# Patient Record
Sex: Male | Born: 1965
Health system: Southern US, Community
[De-identification: ages and names within clinical notes are randomized; demographics above are authoritative.]

## PROBLEM LIST (undated history)

## (undated) DIAGNOSIS — I10 Essential (primary) hypertension: Secondary | ICD-10-CM

## (undated) DIAGNOSIS — K279 Peptic ulcer, site unspecified, unspecified as acute or chronic, without hemorrhage or perforation: Secondary | ICD-10-CM

## (undated) DIAGNOSIS — E119 Type 2 diabetes mellitus without complications: Secondary | ICD-10-CM

## (undated) DIAGNOSIS — G4730 Sleep apnea, unspecified: Secondary | ICD-10-CM

## (undated) DIAGNOSIS — E785 Hyperlipidemia, unspecified: Secondary | ICD-10-CM

## (undated) DIAGNOSIS — T7840XA Allergy, unspecified, initial encounter: Secondary | ICD-10-CM

## (undated) DIAGNOSIS — B9681 Helicobacter pylori [H. pylori] as the cause of diseases classified elsewhere: Secondary | ICD-10-CM

## (undated) DIAGNOSIS — K219 Gastro-esophageal reflux disease without esophagitis: Secondary | ICD-10-CM

## (undated) DIAGNOSIS — N200 Calculus of kidney: Secondary | ICD-10-CM

## (undated) HISTORY — DX: Type 2 diabetes mellitus without complications: E11.9

## (undated) HISTORY — DX: Essential (primary) hypertension: I10

## (undated) HISTORY — DX: Helicobacter pylori (H. pylori) as the cause of diseases classified elsewhere: B96.81

## (undated) HISTORY — DX: Allergy, unspecified, initial encounter: T78.40XA

## (undated) HISTORY — PX: NASAL SINUS SURGERY: SHX719

## (undated) HISTORY — DX: Hyperlipidemia, unspecified: E78.5

## (undated) HISTORY — DX: Calculus of kidney: N20.0

## (undated) HISTORY — DX: Sleep apnea, unspecified: G47.30

## (undated) HISTORY — DX: Peptic ulcer, site unspecified, unspecified as acute or chronic, without hemorrhage or perforation: K27.9

---

## 2005-12-01 ENCOUNTER — Encounter: Admission: RE | Admit: 2005-12-01 | Discharge: 2005-12-01 | Payer: Self-pay | Admitting: Neurosurgery

## 2008-05-03 ENCOUNTER — Emergency Department (HOSPITAL_BASED_OUTPATIENT_CLINIC_OR_DEPARTMENT_OTHER): Admission: EM | Admit: 2008-05-03 | Discharge: 2008-05-03 | Payer: Self-pay | Admitting: Emergency Medicine

## 2011-01-27 ENCOUNTER — Other Ambulatory Visit: Payer: Self-pay | Admitting: Internal Medicine

## 2011-01-29 ENCOUNTER — Ambulatory Visit
Admission: RE | Admit: 2011-01-29 | Discharge: 2011-01-29 | Disposition: A | Discharge status: Home / Self Care | Payer: Commercial Managed Care - PPO | Source: Ambulatory Visit | Attending: Internal Medicine | Admitting: Internal Medicine

## 2011-08-21 LAB — URINALYSIS, ROUTINE W REFLEX MICROSCOPIC
Bilirubin Urine: NEGATIVE
Glucose, UA: NEGATIVE
Ketones, ur: NEGATIVE
Nitrite: NEGATIVE
Protein, ur: 100 — AB
Specific Gravity, Urine: 1.021
Urobilinogen, UA: 0.2
pH: 6

## 2011-08-21 LAB — BASIC METABOLIC PANEL
BUN: 14
CO2: 29
Calcium: 9.3
Chloride: 99
Creatinine, Ser: 1.1
GFR calc Af Amer: >60
GFR calc non Af Amer: >60
Glucose, Bld: 165 — ABNORMAL HIGH
Potassium: 4
Sodium: 139

## 2011-08-21 LAB — DIFFERENTIAL
Basophils Absolute: 0.3 — ABNORMAL HIGH
Basophils Relative: 2 — ABNORMAL HIGH
Eosinophils Absolute: 0
Eosinophils Relative: 0
Lymphocytes Relative: 13
Lymphs Abs: 1.7
Monocytes Absolute: 0.9
Monocytes Relative: 7
Neutro Abs: 10.6 — ABNORMAL HIGH
Neutrophils Relative %: 78 — ABNORMAL HIGH

## 2011-08-21 LAB — CBC
HCT: 39.1
Hemoglobin: 13.1
MCHC: 33.5
MCV: 90
Platelets: 227
RBC: 4.35
RDW: 13.2
WBC: 13.5 — ABNORMAL HIGH

## 2011-08-21 LAB — URINE MICROSCOPIC-ADD ON

## 2011-08-21 LAB — GC/CHLAMYDIA PROBE AMP, GENITAL
Chlamydia, DNA Probe: NEGATIVE
GC Probe Amp, Genital: NEGATIVE

## 2012-02-11 ENCOUNTER — Ambulatory Visit: Payer: Commercial Managed Care - PPO

## 2012-02-11 ENCOUNTER — Ambulatory Visit (INDEPENDENT_AMBULATORY_CARE_PROVIDER_SITE_OTHER): Payer: Commercial Managed Care - PPO | Admitting: Family Medicine

## 2012-02-11 VITALS — BP 141/90 | HR 76 | Temp 97.2°F | Resp 18 | Ht 61.5 in | Wt 229.8 lb

## 2012-02-11 DIAGNOSIS — R0989 Other specified symptoms and signs involving the circulatory and respiratory systems: Secondary | ICD-10-CM

## 2012-02-11 DIAGNOSIS — J329 Chronic sinusitis, unspecified: Secondary | ICD-10-CM

## 2012-02-11 DIAGNOSIS — R05 Cough: Secondary | ICD-10-CM

## 2012-02-11 DIAGNOSIS — R059 Cough, unspecified: Secondary | ICD-10-CM

## 2012-02-11 NOTE — Progress Notes (Signed)
[facility]:  Office Visit  Chief Complaint:  Chief Complaint  Patient presents with  . Cough    congestion ,body aches    HPI: Jamie Foster is a 46 y.o. male who complains of 5 day h/o green productive sputum, chest congestion, nasal draiange, fatigue . Tried OTC meds without releif except mentahol. Tried flonase without relief. +allergies  Past Medical History  Diagnosis Date  . Hyperlipidemia   . Hypertension   . Allergy   . Kidney stone   . H pylori ulcer    Past Surgical History  Procedure Date  . Nasal sinus surgery    History   Social History  . Marital Status: Single    Spouse Name: N/A    Number of Children: N/A  . Years of Education: N/A   Social History Main Topics  . Smoking status: Never Smoker   . Smokeless tobacco: Never Used  . Alcohol Use: No  . Drug Use: No  . Sexually Active: None   Other Topics Concern  . None   Social History Narrative  . None   No family history on file. Allergies no known allergies Prior to Admission medications   Medication Sig Start Date End Date Taking? Authorizing Provider  cyclobenzaprine (FLEXERIL) 10 MG tablet Take 10 mg by mouth 3 (three) times daily as needed.   Yes Historical Provider, MD  fluticasone (FLONASE) 50 MCG/ACT nasal spray Place 2 sprays into the nose daily.   Yes Historical Provider, MD  lisinopril (PRINIVIL,ZESTRIL) 10 MG tablet Take 10 mg by mouth daily.   Yes Historical Provider, MD  omeprazole (PRILOSEC) 40 MG capsule Take 40 mg by mouth daily.   Yes Historical Provider, MD  sitaGLIPtan-metformin (JANUMET) 50-1000 MG per tablet Take 1 tablet by mouth 2 (two) times daily with a meal.   Yes Historical Provider, MD     ROS: The patient denies fevers, chills, night sweats, unintentional weight loss, chest pain, palpitations, wheezing, dyspnea on exertion, nausea, vomiting, abdominal pain, dysuria, hematuria, melena, numbness, weakness, or tingling.  All other systems have  been reviewed and were otherwise negative with the exception of those mentioned in the HPI and as above.    PHYSICAL EXAM: Filed Vitals:   02/11/12 1818  BP: 141/90  Pulse: 76  Temp: 97.2 F (36.2 C)  Resp: 18   Filed Vitals:   02/11/12 1818  Height: 5' 1.5" (1.562 m)  Weight: 229 lb 12.8 oz (104.237 kg)   Body mass index is 42.72 kg/(m^2).  General: Alert, no acute distress HEENT:  Normocephalic, atraumatic, oropharynx patent. TM nl, + sinus tenderness. No exudates Cardiovascular:  Regular rate and rhythm, no rubs murmurs or gallops.  No Carotid bruits, radial pulse intact. No pedal edema.  Respiratory: Clear to auscultation bilaterally.  No wheezes, rales, or rhonchi.  No cyanosis, no use of accessory musculature GI: No organomegaly, abdomen is soft and non-tender, positive bowel sounds.  No masses. Skin: No rashes. Neurologic: Facial musculature symmetric. Psychiatric: Patient is appropriate throughout our interaction. Lymphatic: No cervical lymphadenopathy Musculoskeletal: Gait intact.   LABS:   EKG/XRAY:   Primary read interpreted by Dr. Conley Rolls at [facility]. NO acute cardiopulmonary changes from 01/2011 xray.   ASSESSMENT/PLAN: Encounter Diagnoses  Name Primary?  . Cough Yes  . Chest congestion    1. Symptomatic treatment 2. Z-pack written rx given  3. Hydromet written rx given 4. Also gave a rx for Cephadyn 50 mg/650 mg for sinus HA  Emanuella Nickle PHUONG, DO 02/15/2012 8:26 AM

## 2012-02-12 ENCOUNTER — Telehealth: Payer: Self-pay

## 2012-02-12 NOTE — Telephone Encounter (Signed)
Fioricet is ok at same sig

## 2012-02-12 NOTE — Telephone Encounter (Signed)
[facility] stating that patient was given cephadyn and this has been discontinued and would like a call back as to what to give the patient  Please call [phone]

## 2012-02-12 NOTE — Telephone Encounter (Signed)
Pharmacist notified.

## 2012-05-10 ENCOUNTER — Other Ambulatory Visit: Payer: Self-pay | Admitting: Internal Medicine

## 2012-07-05 ENCOUNTER — Telehealth: Payer: Self-pay

## 2012-07-05 NOTE — Telephone Encounter (Signed)
PT NEEDS A CALLBACK ASAP REGARDING HIS LAST OFFICE VISIT WITH DR. Merla Riches [phone]

## 2012-07-05 NOTE — Telephone Encounter (Signed)
Called patient unable to leave message his mailbox is full.

## 2012-07-06 ENCOUNTER — Other Ambulatory Visit: Payer: Self-pay | Admitting: *Deleted

## 2012-07-07 ENCOUNTER — Telehealth: Payer: Self-pay

## 2012-07-07 NOTE — Telephone Encounter (Signed)
Pt would like to get a refill for his Janumet until next appt with Dr. Merla Riches in October 9 @4 :00.

## 2012-07-07 NOTE — Telephone Encounter (Signed)
Has appt October 9, needs Janumet until then.

## 2012-07-07 NOTE — Telephone Encounter (Signed)
Was able to LM on pt's VM to CB. Pt's chart [id number] is at [facility]' desk w/last OV w/Dr Merla Riches

## 2012-07-08 MED ORDER — SITAGLIPTIN PHOS-METFORMIN HCL 50-1000 MG PO TABS: 1.0000 | ORAL_TABLET | Freq: Two times a day (BID) | ORAL | Status: DC

## 2012-07-08 NOTE — Telephone Encounter (Signed)
Refilled until his appointment.

## 2012-07-08 NOTE — Telephone Encounter (Signed)
No ans/VM full. Could not LM

## 2012-07-16 ENCOUNTER — Other Ambulatory Visit: Payer: Self-pay | Admitting: Family Medicine

## 2012-07-16 MED ORDER — GLUCOSE BLOOD VI STRP: ORAL_STRIP | Status: DC

## 2012-07-19 ENCOUNTER — Ambulatory Visit (INDEPENDENT_AMBULATORY_CARE_PROVIDER_SITE_OTHER): Payer: Commercial Managed Care - PPO | Admitting: Internal Medicine

## 2012-07-19 VITALS — BP 134/81 | HR 80 | Temp 98.8°F | Resp 20 | Ht 73.5 in | Wt 233.0 lb

## 2012-07-19 DIAGNOSIS — G4710 Hypersomnia, unspecified: Secondary | ICD-10-CM

## 2012-07-19 DIAGNOSIS — M5030 Other cervical disc degeneration, unspecified cervical region: Secondary | ICD-10-CM | POA: Insufficient documentation

## 2012-07-19 DIAGNOSIS — I10 Essential (primary) hypertension: Secondary | ICD-10-CM

## 2012-07-19 DIAGNOSIS — Z6830 Body mass index (BMI) 30.0-30.9, adult: Secondary | ICD-10-CM | POA: Insufficient documentation

## 2012-07-19 DIAGNOSIS — G4719 Other hypersomnia: Secondary | ICD-10-CM

## 2012-07-19 DIAGNOSIS — J329 Chronic sinusitis, unspecified: Secondary | ICD-10-CM | POA: Insufficient documentation

## 2012-07-19 DIAGNOSIS — E119 Type 2 diabetes mellitus without complications: Secondary | ICD-10-CM

## 2012-07-19 DIAGNOSIS — K219 Gastro-esophageal reflux disease without esophagitis: Secondary | ICD-10-CM

## 2012-07-19 LAB — POCT CBC
Granulocyte percent: 49.7 %G (ref 37–80)
HCT, POC: 42.9 % — AB (ref 43.5–53.7)
Hemoglobin: 13.2 g/dL — AB (ref 14.1–18.1)
Lymph, poc: 2.3 (ref 0.6–3.4)
MCH, POC: 29.1 pg (ref 27–31.2)
MCHC: 30.8 g/dL — AB (ref 31.8–35.4)
MCV: 94.7 fL (ref 80–97)
MID (cbc): 0.5 (ref 0–0.9)
MPV: 9.4 fL (ref 0–99.8)
POC Granulocyte: 2.7 (ref 2–6.9)
POC LYMPH PERCENT: 41.2 %L (ref 10–50)
POC MID %: 9.1 %M (ref 0–12)
Platelet Count, POC: 280 10*3/uL (ref 142–424)
RBC: 4.53 M/uL — AB (ref 4.69–6.13)
RDW, POC: 13.9 %
WBC: 5.5 10*3/uL (ref 4.6–10.2)

## 2012-07-19 LAB — POCT GLYCOSYLATED HEMOGLOBIN (HGB A1C): Hemoglobin A1C: 9.6

## 2012-07-19 MED ORDER — LISINOPRIL 10 MG PO TABS: 10.0000 mg | ORAL_TABLET | Freq: Every day | ORAL | Status: DC

## 2012-07-19 MED ORDER — SITAGLIPTIN PHOS-METFORMIN HCL 50-1000 MG PO TABS: 1.0000 | ORAL_TABLET | Freq: Two times a day (BID) | ORAL | Status: DC

## 2012-07-19 NOTE — Progress Notes (Signed)
Subjective:    Patient ID: Jamie Foster, male    DOB: June 27, 1966, 46 y.o.   MRN: [id number]  HPIHere for followup/his regular physical examination schedule was discontinued by EPIC Patient Active Problem List  Diagnosis  . DM (diabetes mellitus)  . BMI 30.0-30.9,adult  . HTN (hypertension)  . GERD (gastroesophageal reflux disease)  . DDD (degenerative disc disease), cervical  . Sinusitis, chronic  He has been relatively stable and we called in his medications Current outpatient prescriptions:glucose blood test strip, Test once daily as instructed., Disp: 50 each, Rfl: 2;  lisinopril (PRINIVIL,ZESTRIL) 10 MG tablet, Take 1 tablet (10 mg total) by mouth daily. NEEDS OFFICE VISIT FOR MORE, Disp: 30 tablet, Rfl: 0;  sitaGLIPtan-metformin (JANUMET) 50-1000 MG per tablet, Take 1 tablet by mouth 2 (two) times daily with a meal. NEEDS OFFICE VISIT, Disp: 60 tablet, Rfl: 1 cyclobenzaprine (FLEXERIL) 10 MG tablet, Take 10 mg by mouth 3 (three) times daily as needed., Disp: , Rfl: ;  omeprazole (PRILOSEC) 40 MG capsule, Take 40 mg by mouth daily., Disp: , Rfl:   He has been new job which he loves convatec-new job 8-5/lots of family time /youngest in [place]  He does have a new complaint/when he sits down at work he falls asleep/when he sits down to read he falls asleep/he sleeps from 11-6 and is tired upon awakening There is history of snoring which he treats with Afrin/he has chronic sinus problems/he has not responded to inhaled steroids/He had sinus surgery once before befor moving to [facility] Review of Systems No night sweats/no unexpected weight changes No chest pain or shortness of breath/no edema No genitourinary symptoms    Objective:   Physical Exam Vital signs stable HEENT with boggy turbinates/No nodes or thyromegaly Heart regular No peripheral neurological changes      Results for orders placed in visit on 07/19/12  POCT GLYCOSYLATED HEMOGLOBIN (HGB A1C)      Component  Value Range   Hemoglobin A1C 9.6    POCT CBC      Component Value Range   WBC 5.5  4.6 - 10.2 K/uL   Lymph, poc 2.3  0.6 - 3.4   POC LYMPH PERCENT 41.2  10 - 50 %L   MID (cbc) 0.5  0 - 0.9   POC MID % 9.1  0 - 12 %M   POC Granulocyte 2.7  2 - 6.9   Granulocyte percent 49.7  37 - 80 %G   RBC 4.53 (*) 4.69 - 6.13 M/uL   Hemoglobin 13.2 (*) 14.1 - 18.1 g/dL   HCT, POC 62.9 (*) 52.8 - 53.7 %   MCV 94.7  80 - 97 fL   MCH, POC 29.1  27 - 31.2 pg   MCHC 30.8 (*) 31.8 - 35.4 g/dL   RDW, POC 41.3     Platelet Count, POC 280  142 - 424 K/uL   MPV 9.4  0 - 99.8 fL    Assessment & Plan:   1. DM (diabetes mellitus)  POCT glycosylated hemoglobin (Hb A1C), Comprehensive metabolic panel, Lipid panel  2. BMI 30.0-30.9,adult  PSA  3. HTN (hypertension)  POCT CBC  4. GERD (gastroesophageal reflux disease)    5. DDD (degenerative disc disease), cervical    6. Sinusitis, chronic    7. Daytime hypersomnolence  Nocturnal polysomnography (NPSG)   He has been off Januvia/metformin and thinks this is why his A1c is elevated/home sugars are okay now Meds ordered this encounter  Medications  . lisinopril (  PRINIVIL,ZESTRIL) 10 MG tablet    Sig: Take 1 tablet (10 mg total) by mouth daily.    Dispense:  90 tablet    Refill:  3  . sitaGLIPtan-metformin (JANUMET) 50-1000 MG per tablet    Sig: Take 1 tablet by mouth 2 (two) times daily with a meal.    Dispense:  180 tablet    Refill:  3  Once the sleep study is over we will consider ENT consult for his chronic sinus problems

## 2012-07-20 LAB — COMPREHENSIVE METABOLIC PANEL
ALT: 17 U/L (ref 0–53)
AST: 17 U/L (ref 0–37)
Albumin: 4.7 g/dL (ref 3.5–5.2)
Alkaline Phosphatase: 39 U/L (ref 39–117)
BUN: 13 mg/dL (ref 6–23)
CO2: 29 mEq/L (ref 19–32)
Calcium: 9.9 mg/dL (ref 8.4–10.5)
Chloride: 98 mEq/L (ref 96–112)
Creat: 0.97 mg/dL (ref 0.50–1.35)
Glucose, Bld: 279 mg/dL — ABNORMAL HIGH (ref 70–99)
Potassium: 4.6 mEq/L (ref 3.5–5.3)
Sodium: 136 mEq/L (ref 135–145)
Total Bilirubin: 0.6 mg/dL (ref 0.3–1.2)
Total Protein: 7.2 g/dL (ref 6.0–8.3)

## 2012-07-20 LAB — LIPID PANEL
Cholesterol: 182 mg/dL (ref 0–200)
HDL: 48 mg/dL (ref >39)
LDL Cholesterol: 116 mg/dL — ABNORMAL HIGH (ref 0–99)
Total CHOL/HDL Ratio: 3.8 Ratio
Triglycerides: 88 mg/dL (ref <150)
VLDL: 18 mg/dL (ref 0–40)

## 2012-07-20 LAB — PSA: PSA: 1.17 ng/mL (ref <=4.00)

## 2012-07-21 ENCOUNTER — Telehealth: Payer: Self-pay

## 2012-07-21 DIAGNOSIS — G4710 Hypersomnia, unspecified: Secondary | ICD-10-CM

## 2012-07-21 NOTE — Telephone Encounter (Signed)
I ordered it.  Dr. Merla Riches, these need to be ordered as referrals (amb ref sleep) so it shows up on the referrals work queue.  When you order it as a procedure, they never get an alert and don't know that it needs to be done.

## 2012-07-21 NOTE — Telephone Encounter (Signed)
Pt states he was supposed to have referral for sleep study and has not heard about it. Referrals shows no order from dr Merla Riches for sleep study, but in the pts office note dr Merla Riches mentions a possible follow up to ENT when sleep study results are back. Pt is concerned as asks [place] to take care of this as soon as possible. Can a PA order the sleep study based on dr Yahoo notes since he is not back in the office until the weekend?  bf

## 2012-07-23 ENCOUNTER — Encounter: Payer: Self-pay | Admitting: Internal Medicine

## 2012-09-01 ENCOUNTER — Ambulatory Visit: Payer: Commercial Managed Care - PPO | Admitting: Internal Medicine

## 2012-10-06 ENCOUNTER — Ambulatory Visit (INDEPENDENT_AMBULATORY_CARE_PROVIDER_SITE_OTHER): Payer: 59 | Admitting: Internal Medicine

## 2012-10-06 VITALS — BP 157/98 | HR 79 | Temp 98.1°F | Resp 16 | Ht 73.75 in | Wt 233.0 lb

## 2012-10-06 DIAGNOSIS — I10 Essential (primary) hypertension: Secondary | ICD-10-CM

## 2012-10-06 DIAGNOSIS — K219 Gastro-esophageal reflux disease without esophagitis: Secondary | ICD-10-CM

## 2012-10-06 DIAGNOSIS — E119 Type 2 diabetes mellitus without complications: Secondary | ICD-10-CM

## 2012-10-06 DIAGNOSIS — Z6830 Body mass index (BMI) 30.0-30.9, adult: Secondary | ICD-10-CM

## 2012-10-06 DIAGNOSIS — J329 Chronic sinusitis, unspecified: Secondary | ICD-10-CM

## 2012-10-06 DIAGNOSIS — G4733 Obstructive sleep apnea (adult) (pediatric): Secondary | ICD-10-CM

## 2012-10-06 LAB — POCT GLYCOSYLATED HEMOGLOBIN (HGB A1C): Hemoglobin A1C: 10.5

## 2012-10-06 MED ORDER — GLIPIZIDE 10 MG PO TABS: 10.0000 mg | ORAL_TABLET | ORAL | Status: DC

## 2012-10-06 MED ORDER — MONTELUKAST SODIUM 10 MG PO TABS: 10.0000 mg | ORAL_TABLET | Freq: Every day | ORAL | Status: DC

## 2012-10-06 NOTE — Progress Notes (Signed)
  Subjective:    Patient ID: Jamie Foster, male    DOB: October 27, 1966, 46 y.o.   MRN: [id number]  HPIf/u  Patient Active Problem List  Diagnosis  . DM (diabetes mellitus)-His job is less physically active and he is eating more/one year ago his hemoglobin A1c was 7.6% on this regimen, and now after restarting this medication for 4 months ago he is at 10.5  . BMI 30.0-30.9,adult  . HTN (hypertension)  . GERD (gastroesophageal reflux disease)-Omeprazole bothered his stool pattern so he quit. Now occasional symptoms only? Relationship to nocturnal mucous production  . DDD (degenerative disc disease), cervical-Stable  . Sinusitis, chronic-Allergy workup negative/no help from ear nose and throat/has failed with past medications many times/is frustrated  . OSA (obstructive sleep apnea)-This diagnosis just established a sleep study and he has been at odds with home care over how to set up his CPAP     He enjoys new job Wife lost hours with [employer] Review of Systems Sinus congestion attacks at work in early afternoon and in the middle of the night after he gets up to urinate/frequent dry mouth    Objective:   Physical Exam Vital signs stable except blood pressure 157/98 Weight 233 HEENT clear Heart regular No distal sensory losses       Results for orders placed in visit on 10/06/12  POCT GLYCOSYLATED HEMOGLOBIN (HGB A1C)      Component Value Range   Hemoglobin A1C 10.5      Assessment & Plan:   1. Diabetes -Uncontrolled Add Glucotrol 10 mg XL in the a.m. Improve diet/lose weight  2. Sinusitis, chronic  Retry Singulair/  3. HTN (hypertension)    4. GERD (gastroesophageal reflux disease)  Trial Zantac 150 twice a day  5. DM (diabetes mellitus)    6. BMI 30.0-30.9,adult    7. OSA (obstructive sleep apnea)  Followup with Dr. Richardean Chimera and with [facility] for CPAP    Meds ordered this encounter  Medications  . ibuprofen (ADVIL,MOTRIN) 100 MG tablet    Sig: Take 100 mg by  mouth every 6 (six) hours as needed.  Marland Kitchen glipiZIDE (GLUCOTROL) 10 MG tablet    Sig: Take 1 tablet (10 mg total) by mouth 1 day or 1 dose.    Dispense:  90 tablet    Refill:  3  . montelukast (SINGULAIR) 10 MG tablet    Sig: Take 1 tablet (10 mg total) by mouth at bedtime.    Dispense:  30 tablet    Refill:  3   F/u end of 11/2012

## 2012-10-06 NOTE — Patient Instructions (Addendum)
Add glucotrol in am Add singulair at bedtime Consider Zantac 150mg  twice a day for a month as a test for nighttime mucus control

## 2012-12-22 ENCOUNTER — Telehealth: Payer: Self-pay

## 2012-12-22 ENCOUNTER — Ambulatory Visit (INDEPENDENT_AMBULATORY_CARE_PROVIDER_SITE_OTHER): Payer: 59 | Admitting: Internal Medicine

## 2012-12-22 ENCOUNTER — Encounter: Payer: Self-pay | Admitting: Internal Medicine

## 2012-12-22 VITALS — BP 146/90 | HR 95 | Temp 97.9°F | Resp 18 | Ht 73.58 in | Wt 236.6 lb

## 2012-12-22 DIAGNOSIS — E119 Type 2 diabetes mellitus without complications: Secondary | ICD-10-CM

## 2012-12-22 DIAGNOSIS — Z0000 Encounter for general adult medical examination without abnormal findings: Secondary | ICD-10-CM

## 2012-12-22 DIAGNOSIS — M5030 Other cervical disc degeneration, unspecified cervical region: Secondary | ICD-10-CM

## 2012-12-22 DIAGNOSIS — K219 Gastro-esophageal reflux disease without esophagitis: Secondary | ICD-10-CM

## 2012-12-22 DIAGNOSIS — J329 Chronic sinusitis, unspecified: Secondary | ICD-10-CM

## 2012-12-22 DIAGNOSIS — G4733 Obstructive sleep apnea (adult) (pediatric): Secondary | ICD-10-CM

## 2012-12-22 DIAGNOSIS — I10 Essential (primary) hypertension: Secondary | ICD-10-CM

## 2012-12-22 LAB — POCT CBC
Granulocyte percent: 52.8 %G (ref 37–80)
HCT, POC: 44.1 % (ref 43.5–53.7)
Hemoglobin: 14 g/dL — AB (ref 14.1–18.1)
Lymph, poc: 2.7 (ref 0.6–3.4)
MCH, POC: 29.9 pg (ref 27–31.2)
MCHC: 31.7 g/dL — AB (ref 31.8–35.4)
MCV: 94.1 fL (ref 80–97)
MID (cbc): 0.6 (ref 0–0.9)
MPV: 9 fL (ref 0–99.8)
POC Granulocyte: 3.6 (ref 2–6.9)
POC LYMPH PERCENT: 39 %L (ref 10–50)
POC MID %: 8.2 %M (ref 0–12)
Platelet Count, POC: 309 10*3/uL (ref 142–424)
RBC: 4.69 M/uL (ref 4.69–6.13)
RDW, POC: 13.4 %
WBC: 6.9 10*3/uL (ref 4.6–10.2)

## 2012-12-22 LAB — POCT GLYCOSYLATED HEMOGLOBIN (HGB A1C): Hemoglobin A1C: 10.8

## 2012-12-22 LAB — POCT SEDIMENTATION RATE: POCT SED RATE: 18 mm/hr (ref 0–22)

## 2012-12-22 MED ORDER — ROSIGLITAZONE MALEATE 8 MG PO TABS: 8.0000 mg | ORAL_TABLET | Freq: Every day | ORAL | Status: DC

## 2012-12-22 MED ORDER — AMOXICILLIN-POT CLAVULANATE 875-125 MG PO TABS: 1.0000 | ORAL_TABLET | Freq: Three times a day (TID) | ORAL | Status: DC

## 2012-12-22 NOTE — Progress Notes (Addendum)
Subjective:    Patient ID: Jamie Foster, male    DOB: 12/25/1965, 47 y.o.   MRN: [id number]  HPI followup 1. DDD (degenerative disc disease), cervical   2. DM (diabetes mellitus)   3. GERD (gastroesophageal reflux disease)   4. HTN (hypertension)   5. OSA (obstructive sleep apnea)   6. PE (physical exam), annual -missed  7. Sinusitis   8. Sinusitis, chronic   At last visit we increased medicine by adding Januvia to metformin/his morning blood sugars are still uncontrolled/his sleeping and eating schedule is also very variable Unsure about medicine compliance Complaining of nocturia and increased thirst with dry mouth No complaints of paresthesias or burning extremities  Continues to have problems with neck discomfort intermittently Reflux has been stable His sleep pattern is still very erratic/uses machine times but complains of waking early having pulled off He complains of fatigue/he thinks this is related to not enough sleep  He has also had an increase in his symptoms recently with headaches and lots of nasal discharge particularly in the morning/he has been disappointed by ENT consult/he has not liked nasal steroids   Social history-continues to love new job  Review of Systems No vision changes ENT as above No cough fever chills or night sweats No chest pain or palpitations No change in weight or appetite Reflux stable Note no urinary flow changes No additional neuromuscular complaints No neurological complaints     Objective:   Physical Exam BP 146/90  Pulse 95  Temp 97.9 F (36.6 C) (Oral)  Resp 18  Ht 6' 1.58" (1.869 m)  Wt 236 lb 9.6 oz (107.321 kg)  BMI 30.73 kg/m2  SpO2 98% HEENT clear except for boggy turbinates with purulent mucus Lungs clear Heart regular without murmur/no peripheral edema Abdomen soft without masses or organomegaly Extremities without neurological changes/good peripheral pulses Cranial nerves II through XII intact Gait  normal Mood good/affect appropriate       Results for orders placed in visit on 12/22/12  POCT CBC      Component Value Range   WBC 6.9  4.6 - 10.2 K/uL   Lymph, poc 2.7  0.6 - 3.4   POC LYMPH PERCENT 39.0  10 - 50 %L   MID (cbc) 0.6  0 - 0.9   POC MID % 8.2  0 - 12 %M   POC Granulocyte 3.6  2 - 6.9   Granulocyte percent 52.8  37 - 80 %G   RBC 4.69  4.69 - 6.13 M/uL   Hemoglobin 14.0 (*) 14.1 - 18.1 g/dL   HCT, POC 40.9  81.1 - 53.7 %   MCV 94.1  80 - 97 fL   MCH, POC 29.9  27 - 31.2 pg   MCHC 31.7 (*) 31.8 - 35.4 g/dL   RDW, POC 91.4     Platelet Count, POC 309  142 - 424 K/uL   MPV 9.0  0 - 99.8 fL  POCT GLYCOSYLATED HEMOGLOBIN (HGB A1C)      Component Value Range   Hemoglobin A1C 10.8      Assessment & Plan:  Problem #1 uncontrolled diabetes Problem #2 uncontrolled hypertension Problem #3 overweight Problem #4 degenerative disc disease-cervical Problem #5 recurrent sinusitis Problem #6 sleep apnea/obstructive This can serve as his annual checkup  Meds ordered this encounter  Medications  . rosiglitazone (AVANDIA) 8 MG tablet    Sig: Take 1 tablet (8 mg total) by mouth daily.    Dispense:  30 tablet  Refill:  0           this was too expensive and so will be changed /see below   . amoxicillin-clavulanate (AUGMENTIN) 875-125 MG per tablet    Sig: Take 1 tablet by mouth 3 (three) times daily. Take with food    Dispense:  30 tablet    Refill:  0   recheck in one month with report of morning blood sugars Will increase medications for hypertension at that point Will consider statin although he has complained in the past of side effects He needs to be reevaluated by the sleep center with regard to the efficiency of his machine He may need physical therapy to improve his neck function if he cannot do this at home   Results for orders placed in visit on 12/22/12  POCT CBC      Component Value Range   WBC 6.9  4.6 - 10.2 K/uL   Lymph, poc 2.7  0.6 - 3.4   POC  LYMPH PERCENT 39.0  10 - 50 %L   MID (cbc) 0.6  0 - 0.9   POC MID % 8.2  0 - 12 %M   POC Granulocyte 3.6  2 - 6.9   Granulocyte percent 52.8  37 - 80 %G   RBC 4.69  4.69 - 6.13 M/uL   Hemoglobin 14.0 (*) 14.1 - 18.1 g/dL   HCT, POC 16.1  09.6 - 53.7 %   MCV 94.1  80 - 97 fL   MCH, POC 29.9  27 - 31.2 pg   MCHC 31.7 (*) 31.8 - 35.4 g/dL   RDW, POC 04.5     Platelet Count, POC 309  142 - 424 K/uL   MPV 9.0  0 - 99.8 fL  POCT GLYCOSYLATED HEMOGLOBIN (HGB A1C)      Component Value Range   Hemoglobin A1C 10.8    PSA      Component Value Range   PSA 0.57  <=4.00 ng/mL  LIPID PANEL      Component Value Range   Cholesterol 214 (*) 0 - 200 mg/dL   Triglycerides 74  <409 mg/dL   HDL 51  >81 mg/dL   Total CHOL/HDL Ratio 4.2     VLDL 15  0 - 40 mg/dL   LDL Cholesterol 191 (*) 0 - 99 mg/dL  COMPREHENSIVE METABOLIC PANEL      Component Value Range   Sodium 136  135 - 145 mEq/L   Potassium 4.1  3.5 - 5.3 mEq/L   Chloride 99  96 - 112 mEq/L   CO2 29  19 - 32 mEq/L   Glucose, Bld 209 (*) 70 - 99 mg/dL   BUN 13  6 - 23 mg/dL   Creat 4.78  2.95 - 6.21 mg/dL   Total Bilirubin 0.4  0.3 - 1.2 mg/dL   Alkaline Phosphatase 51  39 - 117 U/L   AST 18  0 - 37 U/L   ALT 22  0 - 53 U/L   Total Protein 7.7  6.0 - 8.3 g/dL   Albumin 5.0  3.5 - 5.2 g/dL   Calcium 30.8  8.4 - 65.7 mg/dL  POCT SEDIMENTATION RATE      Component Value Range   POCT SED RATE 18  0 - 22 mm/hr  MICROALBUMIN, URINE      Component Value Range   Microalb, Ur 2.48 (*) 0.00 - 1.89 mg/dL   Addendum 84/69/6295 Increase Glucotrol to 10 mg twice a day Add  actos 30 mg daily Continue Januvia/metformin Discontinue order for Avandia as noted

## 2012-12-22 NOTE — Telephone Encounter (Signed)
Dr. Merla Riches, [facility] pharm called because he said that there are some restrictions on the Avandia and it is crazy expensive and not covered by insurance. The pharm wants to know if you wanted to prescribe anything else instead?

## 2012-12-23 LAB — COMPREHENSIVE METABOLIC PANEL
ALT: 22 U/L (ref 0–53)
AST: 18 U/L (ref 0–37)
Albumin: 5 g/dL (ref 3.5–5.2)
Alkaline Phosphatase: 51 U/L (ref 39–117)
BUN: 13 mg/dL (ref 6–23)
CO2: 29 mEq/L (ref 19–32)
Calcium: 10.1 mg/dL (ref 8.4–10.5)
Chloride: 99 mEq/L (ref 96–112)
Creat: 0.93 mg/dL (ref 0.50–1.35)
Glucose, Bld: 209 mg/dL — ABNORMAL HIGH (ref 70–99)
Potassium: 4.1 mEq/L (ref 3.5–5.3)
Sodium: 136 mEq/L (ref 135–145)
Total Bilirubin: 0.4 mg/dL (ref 0.3–1.2)
Total Protein: 7.7 g/dL (ref 6.0–8.3)

## 2012-12-23 LAB — PSA: PSA: 0.57 ng/mL (ref <=4.00)

## 2012-12-23 LAB — LIPID PANEL
Cholesterol: 214 mg/dL — ABNORMAL HIGH (ref 0–200)
HDL: 51 mg/dL (ref >39)
LDL Cholesterol: 148 mg/dL — ABNORMAL HIGH (ref 0–99)
Total CHOL/HDL Ratio: 4.2 Ratio
Triglycerides: 74 mg/dL (ref <150)
VLDL: 15 mg/dL (ref 0–40)

## 2012-12-23 LAB — MICROALBUMIN, URINE: Microalb, Ur: 2.48 mg/dL — ABNORMAL HIGH (ref 0.00–1.89)

## 2012-12-24 ENCOUNTER — Telehealth: Payer: Self-pay

## 2012-12-24 MED ORDER — GLIPIZIDE 10 MG PO TABS: 10.0000 mg | ORAL_TABLET | Freq: Two times a day (BID) | ORAL | Status: DC

## 2012-12-24 MED ORDER — PIOGLITAZONE HCL 30 MG PO TABS: 30.0000 mg | ORAL_TABLET | Freq: Every day | ORAL | Status: DC

## 2012-12-24 NOTE — Telephone Encounter (Signed)
Please advise 

## 2012-12-24 NOTE — Telephone Encounter (Signed)
Patient advised.

## 2012-12-24 NOTE — Telephone Encounter (Signed)
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 Call him at [phone]. He uses [facility] in [place].

## 2012-12-24 NOTE — Addendum Note (Signed)
Addended by: Tonye Pearson on: 12/24/2012 03:35 PM   Modules accepted: Orders

## 2012-12-24 NOTE — Telephone Encounter (Signed)
Discontinue the Avandia See if we can add actos 30 mg-prescription sent for #30 with one refill Have him increase Glucotrol to one twice a day with meals/prescription sent for that as well Raynelle Fanning has made an appointment for him in 6 weeks

## 2012-12-24 NOTE — Telephone Encounter (Signed)
Thank you :)

## 2012-12-25 ENCOUNTER — Encounter: Payer: Self-pay | Admitting: Internal Medicine

## 2013-01-26 ENCOUNTER — Encounter (HOSPITAL_BASED_OUTPATIENT_CLINIC_OR_DEPARTMENT_OTHER): Payer: Self-pay | Admitting: Emergency Medicine

## 2013-01-26 ENCOUNTER — Emergency Department (HOSPITAL_BASED_OUTPATIENT_CLINIC_OR_DEPARTMENT_OTHER): Payer: 59

## 2013-01-26 ENCOUNTER — Emergency Department (HOSPITAL_BASED_OUTPATIENT_CLINIC_OR_DEPARTMENT_OTHER)
Admission: EM | Admit: 2013-01-26 | Discharge: 2013-01-26 | Disposition: A | Discharge status: Home / Self Care | Payer: 59 | Attending: Emergency Medicine | Admitting: Emergency Medicine

## 2013-01-26 ENCOUNTER — Other Ambulatory Visit: Payer: Self-pay | Admitting: Physician Assistant

## 2013-01-26 DIAGNOSIS — Z79899 Other long term (current) drug therapy: Secondary | ICD-10-CM | POA: Insufficient documentation

## 2013-01-26 DIAGNOSIS — Z862 Personal history of diseases of the blood and blood-forming organs and certain disorders involving the immune mechanism: Secondary | ICD-10-CM | POA: Insufficient documentation

## 2013-01-26 DIAGNOSIS — R1032 Left lower quadrant pain: Secondary | ICD-10-CM

## 2013-01-26 DIAGNOSIS — Z8711 Personal history of peptic ulcer disease: Secondary | ICD-10-CM | POA: Insufficient documentation

## 2013-01-26 DIAGNOSIS — R21 Rash and other nonspecific skin eruption: Secondary | ICD-10-CM | POA: Insufficient documentation

## 2013-01-26 DIAGNOSIS — I10 Essential (primary) hypertension: Secondary | ICD-10-CM | POA: Insufficient documentation

## 2013-01-26 DIAGNOSIS — Z8639 Personal history of other endocrine, nutritional and metabolic disease: Secondary | ICD-10-CM | POA: Insufficient documentation

## 2013-01-26 DIAGNOSIS — R109 Unspecified abdominal pain: Secondary | ICD-10-CM | POA: Insufficient documentation

## 2013-01-26 DIAGNOSIS — Z87442 Personal history of urinary calculi: Secondary | ICD-10-CM | POA: Insufficient documentation

## 2013-01-26 MED ORDER — NAPROXEN 500 MG PO TABS: 500.0000 mg | ORAL_TABLET | Freq: Two times a day (BID) | ORAL | Status: DC

## 2013-01-26 MED ORDER — HYDROCODONE-ACETAMINOPHEN 5-325 MG PO TABS: 1.0000 | ORAL_TABLET | Freq: Four times a day (QID) | ORAL | Status: DC | PRN

## 2013-01-26 MED ORDER — IBUPROFEN 800 MG PO TABS: 800.0000 mg | ORAL_TABLET | Freq: Three times a day (TID) | ORAL | Status: DC

## 2013-01-26 NOTE — ED Provider Notes (Signed)
History     CSN: [id number]  Arrival date & time 01/26/13  4098   First MD Initiated Contact with Patient 01/26/13 0932      Chief Complaint  Patient presents with  . Hip Pain    (Consider location/radiation/quality/duration/timing/severity/associated sxs/prior treatment) Patient is a 47 y.o. male presenting with hip pain. The history is provided by the patient.  Hip Pain Pertinent negatives include no chest pain, no abdominal pain and no shortness of breath.   patient with onset of acute pain in the left inner thigh area last evening around 7:30 while abductor in his left thigh. Pain got worse during the night at worst a 10 out of 10 currently 8/10. Was taking Advil for without significant help. Pain is nonradiating no other complaints. No other injuries no back pain no fever no additional leg complaints. No history of similar problem. Pain is described as sharp. Worse with movement of the left hip.  Past Medical History  Diagnosis Date  . Hyperlipidemia   . Hypertension   . Allergy   . Kidney stone   . H pylori ulcer     Past Surgical History  Procedure Laterality Date  . Nasal sinus surgery      History reviewed. No pertinent family history.  History  Substance Use Topics  . Smoking status: Never Smoker   . Smokeless tobacco: Never Used  . Alcohol Use: No      Review of Systems  Constitutional: Negative for fever.  HENT: Negative for neck pain.   Respiratory: Negative for shortness of breath.   Cardiovascular: Negative for chest pain and leg swelling.  Gastrointestinal: Negative for nausea, vomiting and abdominal pain.  Genitourinary: Negative for dysuria, scrotal swelling and testicular pain.  Musculoskeletal: Negative for back pain.  Skin: Positive for rash.  Neurological: Negative for weakness and numbness.  Hematological: Does not bruise/bleed easily.    Allergies  Review of patient's allergies indicates no known allergies.  Home Medications    Current Outpatient Rx  Name  Route  Sig  Dispense  Refill  . glipiZIDE (GLUCOTROL) 10 MG tablet   Oral   Take 1 tablet (10 mg total) by mouth 2 (two) times daily before a meal.   180 tablet   3   . ibuprofen (ADVIL,MOTRIN) 100 MG tablet   Oral   Take 100 mg by mouth every 6 (six) hours as needed.         Marland Kitchen lisinopril (PRINIVIL,ZESTRIL) 10 MG tablet   Oral   Take 1 tablet (10 mg total) by mouth daily.   90 tablet   3   . pioglitazone (ACTOS) 30 MG tablet   Oral   Take 1 tablet (30 mg total) by mouth daily.   30 tablet   1   . sitaGLIPtan-metformin (JANUMET) 50-1000 MG per tablet   Oral   Take 1 tablet by mouth 2 (two) times daily with a meal.   180 tablet   3   . amoxicillin-clavulanate (AUGMENTIN) 875-125 MG per tablet   Oral   Take 1 tablet by mouth 3 (three) times daily. Take with food   30 tablet   0   . glucose blood test strip      Test once daily as instructed.   50 each   2     NEEDS OFFICE VISIT!   . HYDROcodone-acetaminophen (NORCO/VICODIN) 5-325 MG per tablet   Oral   Take 1-2 tablets by mouth every 6 (six) hours as needed for pain.  20 tablet   0   . montelukast (SINGULAIR) 10 MG tablet   Oral   Take 1 tablet (10 mg total) by mouth at bedtime.   30 tablet   3   . naproxen (NAPROSYN) 500 MG tablet   Oral   Take 1 tablet (500 mg total) by mouth 2 (two) times daily.   14 tablet   0   . rosiglitazone (AVANDIA) 8 MG tablet   Oral   Take 1 tablet (8 mg total) by mouth daily.   30 tablet   0     BP 137/82  Pulse 92  Temp(Src) 97.3 F (36.3 C) (Oral)  Ht 6\' 2"  (1.88 m)  Wt 244 lb (110.678 kg)  BMI 31.31 kg/m2  SpO2 100%  Physical Exam  Nursing note and vitals reviewed. Constitutional: He is oriented to person, place, and time. He appears well-developed and well-nourished.  HENT:  Head: Normocephalic and atraumatic.  Mouth/Throat: Oropharynx is clear and moist.  Eyes: Conjunctivae and EOM are normal. Pupils are equal,  round, and reactive to light.  Neck: Normal range of motion. Neck supple.  Cardiovascular: Normal rate, regular rhythm, normal heart sounds and intact distal pulses.   No murmur heard. Pulmonary/Chest: Effort normal and breath sounds normal.  Abdominal: Soft. Bowel sounds are normal. There is no tenderness.  Genitourinary:  No testicular pain or swelling no groin mass.  Musculoskeletal: He exhibits no edema and no tenderness.  Thigh with no palpable tenderness. However has pain with range of motion of the left thigh. Distally dorsalis pedis pulse is 2+ sensation intact.  Neurological: He is alert and oriented to person, place, and time. No cranial nerve deficit. He exhibits normal muscle tone. Coordination normal.  Skin: Skin is warm. No rash noted. No erythema.    ED Course  Procedures (including critical care time)  Labs Reviewed - No data to display Dg Hip Complete Left  01/26/2013  *RADIOLOGY REPORT*  Clinical Data: Acute onset sharp left hip pain with turning, now pain with movement and walking  LEFT HIP - COMPLETE 2+ VIEW  Comparison: None  Findings: Hip and SI joints symmetric and preserved. Numerous pelvic phleboliths. Osseous mineralization normal. No acute fracture, dislocation or bone destruction.  IMPRESSION: No acute osseous abnormalities.   Original Report Authenticated By: Ulyses Southward, M.D.      1. Left groin pain       MDM  Suspect left groin muscle pull. Particularly in the abductor muscle area. No evidence of bony abnormality to the left hip.        Shelda Jakes, MD 01/26/13 [phone]

## 2013-01-26 NOTE — ED Notes (Signed)
Attempted to discharge pt. Pt requesting different pain meds. MD aware and awaiting new prescription.

## 2013-01-26 NOTE — ED Notes (Signed)
Pt is sitting upright in bed. Pt cannot bear weight on left leg without pain. Pt states he swung his leg out of his chair, like normal, and he felt pain in his left hip.

## 2013-02-02 ENCOUNTER — Ambulatory Visit: Payer: 59 | Admitting: Internal Medicine

## 2013-02-02 VITALS — BP 128/79 | HR 91 | Temp 97.9°F | Resp 18 | Ht 73.5 in | Wt 237.0 lb

## 2013-02-02 DIAGNOSIS — E1165 Type 2 diabetes mellitus with hyperglycemia: Secondary | ICD-10-CM

## 2013-02-06 NOTE — Progress Notes (Signed)
Left w/out being seen because I was behind/late

## 2013-02-25 ENCOUNTER — Encounter: Payer: Self-pay | Admitting: Neurology

## 2013-03-23 ENCOUNTER — Ambulatory Visit: Payer: 59

## 2013-03-23 ENCOUNTER — Other Ambulatory Visit: Payer: Self-pay

## 2013-03-23 ENCOUNTER — Ambulatory Visit (INDEPENDENT_AMBULATORY_CARE_PROVIDER_SITE_OTHER): Payer: 59 | Admitting: Internal Medicine

## 2013-03-23 ENCOUNTER — Encounter: Payer: Self-pay | Admitting: Internal Medicine

## 2013-03-23 VITALS — BP 147/98 | HR 85 | Temp 97.9°F | Resp 18 | Ht 73.0 in | Wt 245.0 lb

## 2013-03-23 DIAGNOSIS — IMO0001 Reserved for inherently not codable concepts without codable children: Secondary | ICD-10-CM

## 2013-03-23 DIAGNOSIS — E119 Type 2 diabetes mellitus without complications: Secondary | ICD-10-CM

## 2013-03-23 DIAGNOSIS — Z6830 Body mass index (BMI) 30.0-30.9, adult: Secondary | ICD-10-CM

## 2013-03-23 DIAGNOSIS — R059 Cough, unspecified: Secondary | ICD-10-CM

## 2013-03-23 DIAGNOSIS — I10 Essential (primary) hypertension: Secondary | ICD-10-CM

## 2013-03-23 DIAGNOSIS — J019 Acute sinusitis, unspecified: Secondary | ICD-10-CM

## 2013-03-23 DIAGNOSIS — E785 Hyperlipidemia, unspecified: Secondary | ICD-10-CM

## 2013-03-23 DIAGNOSIS — J329 Chronic sinusitis, unspecified: Secondary | ICD-10-CM

## 2013-03-23 DIAGNOSIS — R05 Cough: Secondary | ICD-10-CM

## 2013-03-23 LAB — POCT GLYCOSYLATED HEMOGLOBIN (HGB A1C): Hemoglobin A1C: 7.8

## 2013-03-23 MED ORDER — AMOXICILLIN-POT CLAVULANATE 875-125 MG PO TABS: 1.0000 | ORAL_TABLET | Freq: Two times a day (BID) | ORAL | Status: DC

## 2013-03-23 MED ORDER — HYDROCODONE-HOMATROPINE 5-1.5 MG/5ML PO SYRP: 5.0000 mL | ORAL_SOLUTION | Freq: Four times a day (QID) | ORAL | Status: DC | PRN

## 2013-03-23 NOTE — Progress Notes (Signed)
  Subjective:    Patient ID: Jamie Foster, male    DOB: 10-Apr-1966, 47 y.o.   MRN: [id number]  HPI F/u Type II or unspecified type diabetes mellitus without mention of complication, uncontrolled - see adding glip 3 mos ago Other and unspecified hyperlipidemia -stable off meds last labs/against meds Sinusitis, chronic-impr p augm in 1/14 HTN (hypertension)-cont lisin BMI 30.0-30.9,adult-trying to chg diet  New prob=cough 5 days/congested No fever/cough productive/nose clogged  Bikes w/ kids Job made permanent!  Review of Systems  Constitutional: Negative for fatigue and unexpected weight change.  HENT: Negative for neck pain.        Stopped all of his allergy medicines including Singulair in January out of frustration and has actually done well until this week  Eyes: Negative for discharge, itching and visual disturbance.  Respiratory: Negative for shortness of breath.   Cardiovascular: Negative for chest pain, palpitations and leg swelling.  Neurological: Negative for headaches.       Objective:   Physical Exam BP 147/98  Pulse 85  Temp(Src) 97.9 F (36.6 C)  Resp 18  Ht 6\' 1"  (1.854 m)  Wt 245 lb (111.131 kg)  BMI 32.33 kg/m2 Conjunctiva slightly injected TMs clear Nares boggy/purulent discharge Throat clear Lungs clear No sensory losses in the feet Good peripheral pulses  Results for orders placed in visit on 03/23/13  POCT GLYCOSYLATED HEMOGLOBIN (HGB A1C)      Result Value Range   Hemoglobin A1C 7.8          Assessment & Plan:  Type II or unspecified type diabetes mellitus without mention of complication, uncontrolled -   Improved/continue same regimen-Janumet and Glucotrol Other and unspecified hyperlipidemia -recheck labs Acute sinusitis, unspecified - Plan: amoxicillin-clavulanate (AUGMENTIN) 875-125 MG per tablet Cough - Plan: HYDROcodone-homatropine (HYCODAN) 5-1.5 MG/5ML syrup  Sinusitis, chronic//twice for relapse of current acute  infection  HTN (hypertension)-elevated today/check home blood pressures/continue exercises and improve diet  Call if above 135/85 consistently otherwise recheck 3 months  BMI 30.0-30.9,adult  Meds ordered this encounter  Medications  . amoxicillin-clavulanate (AUGMENTIN) 875-125 MG per tablet    Sig: Take 1 tablet by mouth 2 (two) times daily.    Dispense:  20 tablet    Refill:  0  . HYDROcodone-homatropine (HYCODAN) 5-1.5 MG/5ML syrup    Sig: Take 5 mLs by mouth every 6 (six) hours as needed for cough.    Dispense:  120 mL    Refill:  0   3mos

## 2013-03-24 ENCOUNTER — Encounter: Payer: Self-pay | Admitting: Internal Medicine

## 2013-03-24 LAB — CBC WITH DIFFERENTIAL/PLATELET
Basophils Absolute: 0 10*3/uL (ref 0.0–0.1)
Basophils Relative: 1 % (ref 0–1)
Eosinophils Absolute: 0.1 10*3/uL (ref 0.0–0.7)
Eosinophils Relative: 2 % (ref 0–5)
HCT: 40.2 % (ref 39.0–52.0)
Hemoglobin: 13.9 g/dL (ref 13.0–17.0)
Lymphocytes Relative: 29 % (ref 12–46)
Lymphs Abs: 1.8 10*3/uL (ref 0.7–4.0)
MCH: 29.8 pg (ref 26.0–34.0)
MCHC: 34.6 g/dL (ref 30.0–36.0)
MCV: 86.3 fL (ref 78.0–100.0)
Monocytes Absolute: 0.6 10*3/uL (ref 0.1–1.0)
Monocytes Relative: 11 % (ref 3–12)
Neutro Abs: 3.6 10*3/uL (ref 1.7–7.7)
Neutrophils Relative %: 57 % (ref 43–77)
Platelets: 275 10*3/uL (ref 150–400)
RBC: 4.66 MIL/uL (ref 4.22–5.81)
RDW: 13.5 % (ref 11.5–15.5)
WBC: 6.1 10*3/uL (ref 4.0–10.5)

## 2013-03-24 LAB — COMPREHENSIVE METABOLIC PANEL
ALT: 26 U/L (ref 0–53)
AST: 20 U/L (ref 0–37)
Albumin: 4.8 g/dL (ref 3.5–5.2)
Alkaline Phosphatase: 38 U/L — ABNORMAL LOW (ref 39–117)
BUN: 11 mg/dL (ref 6–23)
CO2: 28 mEq/L (ref 19–32)
Calcium: 10.1 mg/dL (ref 8.4–10.5)
Chloride: 101 mEq/L (ref 96–112)
Creat: 0.93 mg/dL (ref 0.50–1.35)
Glucose, Bld: 134 mg/dL — ABNORMAL HIGH (ref 70–99)
Potassium: 4.2 mEq/L (ref 3.5–5.3)
Sodium: 138 mEq/L (ref 135–145)
Total Bilirubin: 0.4 mg/dL (ref 0.3–1.2)
Total Protein: 7.6 g/dL (ref 6.0–8.3)

## 2013-03-24 LAB — LIPID PANEL
Cholesterol: 219 mg/dL — ABNORMAL HIGH (ref 0–200)
HDL: 56 mg/dL (ref >39)
LDL Cholesterol: 141 mg/dL — ABNORMAL HIGH (ref 0–99)
Total CHOL/HDL Ratio: 3.9 Ratio
Triglycerides: 109 mg/dL (ref <150)
VLDL: 22 mg/dL (ref 0–40)

## 2013-06-22 ENCOUNTER — Encounter: Payer: Self-pay | Admitting: Internal Medicine

## 2013-06-22 ENCOUNTER — Ambulatory Visit (INDEPENDENT_AMBULATORY_CARE_PROVIDER_SITE_OTHER): Payer: 59 | Admitting: Internal Medicine

## 2013-06-22 VITALS — BP 150/94 | HR 88 | Temp 98.3°F | Resp 16 | Ht 73.0 in | Wt 238.6 lb

## 2013-06-22 DIAGNOSIS — J309 Allergic rhinitis, unspecified: Secondary | ICD-10-CM

## 2013-06-22 DIAGNOSIS — I10 Essential (primary) hypertension: Secondary | ICD-10-CM

## 2013-06-22 DIAGNOSIS — H1010 Acute atopic conjunctivitis, unspecified eye: Secondary | ICD-10-CM

## 2013-06-22 DIAGNOSIS — M5030 Other cervical disc degeneration, unspecified cervical region: Secondary | ICD-10-CM

## 2013-06-22 DIAGNOSIS — J019 Acute sinusitis, unspecified: Secondary | ICD-10-CM

## 2013-06-22 DIAGNOSIS — K219 Gastro-esophageal reflux disease without esophagitis: Secondary | ICD-10-CM

## 2013-06-22 DIAGNOSIS — Z0000 Encounter for general adult medical examination without abnormal findings: Secondary | ICD-10-CM

## 2013-06-22 DIAGNOSIS — Z8042 Family history of malignant neoplasm of prostate: Secondary | ICD-10-CM

## 2013-06-22 DIAGNOSIS — E119 Type 2 diabetes mellitus without complications: Secondary | ICD-10-CM

## 2013-06-22 DIAGNOSIS — H1013 Acute atopic conjunctivitis, bilateral: Secondary | ICD-10-CM

## 2013-06-22 LAB — LIPID PANEL
Cholesterol: 209 mg/dL — ABNORMAL HIGH (ref 0–200)
HDL: 47 mg/dL (ref >39)
LDL Cholesterol: 138 mg/dL — ABNORMAL HIGH (ref 0–99)
Total CHOL/HDL Ratio: 4.4 Ratio
Triglycerides: 122 mg/dL (ref <150)
VLDL: 24 mg/dL (ref 0–40)

## 2013-06-22 LAB — CBC WITH DIFFERENTIAL/PLATELET
Basophils Absolute: 0 10*3/uL (ref 0.0–0.1)
Basophils Relative: 1 % (ref 0–1)
Eosinophils Absolute: 0.1 10*3/uL (ref 0.0–0.7)
Eosinophils Relative: 2 % (ref 0–5)
HCT: 40.1 % (ref 39.0–52.0)
Hemoglobin: 13.6 g/dL (ref 13.0–17.0)
Lymphocytes Relative: 38 % (ref 12–46)
Lymphs Abs: 2.2 10*3/uL (ref 0.7–4.0)
MCH: 30 pg (ref 26.0–34.0)
MCHC: 33.9 g/dL (ref 30.0–36.0)
MCV: 88.3 fL (ref 78.0–100.0)
Monocytes Absolute: 0.5 10*3/uL (ref 0.1–1.0)
Monocytes Relative: 9 % (ref 3–12)
Neutro Abs: 3 10*3/uL (ref 1.7–7.7)
Neutrophils Relative %: 50 % (ref 43–77)
Platelets: 301 10*3/uL (ref 150–400)
RBC: 4.54 MIL/uL (ref 4.22–5.81)
RDW: 12.7 % (ref 11.5–15.5)
WBC: 5.9 10*3/uL (ref 4.0–10.5)

## 2013-06-22 LAB — COMPREHENSIVE METABOLIC PANEL
ALT: 23 U/L (ref 0–53)
AST: 19 U/L (ref 0–37)
Albumin: 4.7 g/dL (ref 3.5–5.2)
Alkaline Phosphatase: 41 U/L (ref 39–117)
BUN: 14 mg/dL (ref 6–23)
CO2: 28 mEq/L (ref 19–32)
Calcium: 9.9 mg/dL (ref 8.4–10.5)
Chloride: 99 mEq/L (ref 96–112)
Creat: 1.21 mg/dL (ref 0.50–1.35)
Glucose, Bld: 147 mg/dL — ABNORMAL HIGH (ref 70–99)
Potassium: 5.1 mEq/L (ref 3.5–5.3)
Sodium: 136 mEq/L (ref 135–145)
Total Bilirubin: 0.6 mg/dL (ref 0.3–1.2)
Total Protein: 7.3 g/dL (ref 6.0–8.3)

## 2013-06-22 LAB — POCT GLYCOSYLATED HEMOGLOBIN (HGB A1C): Hemoglobin A1C: 8.5

## 2013-06-22 MED ORDER — OMEPRAZOLE 40 MG PO CPDR: 40.0000 mg | DELAYED_RELEASE_CAPSULE | Freq: Every day | ORAL | Status: DC

## 2013-06-22 MED ORDER — OLOPATADINE HCL 0.1 % OP SOLN: 1.0000 [drp] | Freq: Two times a day (BID) | OPHTHALMIC | Status: DC

## 2013-06-22 MED ORDER — SITAGLIPTIN PHOS-METFORMIN HCL 50-1000 MG PO TABS: 1.0000 | ORAL_TABLET | Freq: Two times a day (BID) | ORAL | Status: DC

## 2013-06-22 MED ORDER — LISINOPRIL 10 MG PO TABS: 10.0000 mg | ORAL_TABLET | Freq: Every day | ORAL | Status: DC

## 2013-06-22 MED ORDER — GLIPIZIDE 10 MG PO TABS: 10.0000 mg | ORAL_TABLET | Freq: Every day | ORAL | Status: DC

## 2013-06-22 MED ORDER — AMOXICILLIN-POT CLAVULANATE 875-125 MG PO TABS: 1.0000 | ORAL_TABLET | Freq: Two times a day (BID) | ORAL | Status: DC

## 2013-06-22 MED ORDER — GLIPIZIDE 10 MG PO TABS: 10.0000 mg | ORAL_TABLET | Freq: Two times a day (BID) | ORAL | Status: DC

## 2013-06-22 MED ORDER — CYCLOBENZAPRINE HCL 10 MG PO TABS: 10.0000 mg | ORAL_TABLET | Freq: Every day | ORAL | Status: DC

## 2013-06-22 NOTE — Progress Notes (Signed)
Subjective:    Patient ID: Jamie Foster, male    DOB: 12/03/1965, 47 y.o.   MRN: [id number]  [facility] Patient Active Problem List   Diagnosis Date Noted  . Allergic conjunctivitis 06/22/2013  . Allergic rhinitis 06/22/2013  . OSA (obstructive sleep apnea)--he no longer considers this a problem  10/06/2012  . DM (diabetes mellitus)--he has been taking Glucotrol once a day  07/19/2012  . BMI 30.0-30.9,adult 07/19/2012  . HTN (hypertension)--outside blood pressures are normal he says  07/19/2012  . GERD (gastroesophageal reflux disease)--- recent increase in symptoms  07/19/2012  . DDD (degenerative disc disease), cervical-----pain comes and goes related to activity /sometimes severe enough to interfere with sleep /no radiation to the extremities   07/19/2012  . 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 He appears well-developed and well-nourished.  HENT:  Head: Normocephalic.  Right Ear: External ear normal.  Left Ear: External ear normal.  Mouth/Throat: Oropharynx is clear and moist. No oropharyngeal exudate.  Turbinates are boggy with clear rhinorrhea  Eyes: Conjunctivae and EOM are normal. Pupils are equal, round, and reactive to light.  Neck: No thyromegaly present.  His neck has pain with rotation to the left is felt in the right posterior cervical region with some tenderness along the scapular border but no clear radiation of the pain into his arm  Cardiovascular: Normal  rate, regular rhythm, normal heart sounds and intact distal pulses.   No murmur heard. Pulmonary/Chest: Effort normal. He has no wheezes.  Abdominal: Soft. Bowel sounds are normal. He exhibits no mass. There is no tenderness.  Musculoskeletal: Normal range of motion. He exhibits no edema and no tenderness.  Lymphadenopathy:    He has no cervical adenopathy.  Neurological: He is alert and oriented to person, place, and time. He has normal reflexes. He displays normal reflexes. No cranial nerve deficit. He exhibits normal muscle tone. Coordination normal.  Skin: No rash noted.  Psychiatric: He has a normal mood and affect. His behavior is normal. Judgment and thought content normal.   BP 150/94  Pulse 88  Temp(Src) 98.3 F (36.8 C) (Oral)  Resp 16  Ht 6\' 1"  (1.854 m)  Wt 238 lb 9.6 oz (108.228 kg)  BMI 31.49 kg/m2  SpO2 96%  Results for orders placed in visit on 06/22/13  CBC WITH DIFFERENTIAL      Result Value Range   WBC 5.9  4.0 - 10.5 K/uL   RBC 4.54  4.22 - 5.81 MIL/uL   Hemoglobin 13.6  13.0 - 17.0 g/dL   HCT 40.9  81.1 - 91.4 %   MCV 88.3  78.0 - 100.0 fL   MCH 30.0  26.0 - 34.0 pg   MCHC 33.9  30.0 - 36.0 g/dL   RDW 78.2  95.6 - 21.3 %   Platelets 301  150 - 400 K/uL   Neutrophils Relative % 50  43 - 77 %   Neutro Abs 3.0  1.7 - 7.7 K/uL  Lymphocytes Relative 38  12 - 46 %   Lymphs Abs 2.2  0.7 - 4.0 K/uL   Monocytes Relative 9  3 - 12 %   Monocytes Absolute 0.5  0.1 - 1.0 K/uL   Eosinophils Relative 2  0 - 5 %   Eosinophils Absolute 0.1  0.0 - 0.7 K/uL   Basophils Relative 1  0 - 1 %   Basophils Absolute 0.0  0.0 - 0.1 K/uL   Smear Review Criteria for review not met    COMPREHENSIVE METABOLIC PANEL      Result Value Range   Sodium 136  135 - 145 mEq/L   Potassium 5.1  3.5 - 5.3 mEq/L   Chloride 99  96 - 112 mEq/L   CO2 28  19 - 32 mEq/L   Glucose, Bld 147 (*) 70 - 99 mg/dL   BUN 14  6 - 23 mg/dL   Creat 1.61  0.96 - 0.45 mg/dL   Total Bilirubin 0.6  0.3 -  1.2 mg/dL   Alkaline Phosphatase 41  39 - 117 U/L   AST 19  0 - 37 U/L   ALT 23  0 - 53 U/L   Total Protein 7.3  6.0 - 8.3 g/dL   Albumin 4.7  3.5 - 5.2 g/dL   Calcium 9.9  8.4 - 40.9 mg/dL  LIPID PANEL      Result Value Range   Cholesterol 209 (*) 0 - 200 mg/dL   Triglycerides 811  <914 mg/dL   HDL 47  >78 mg/dL   Total CHOL/HDL Ratio 4.4     VLDL 24  0 - 40 mg/dL   LDL Cholesterol 295 (*) 0 - 99 mg/dL  PSA      Result Value Range   PSA 0.69  <=4.00 ng/mL  HELICOBACTER PYLORI  ANTIBODY, IGM      Result Value Range   Helicobacter pylori, IgM 0.4  <9.0 U/mL  POCT GLYCOSYLATED HEMOGLOBIN (HGB A1C)      Result Value Range   Hemoglobin A1C 8.5             Assessment & Plan:  Routine general medical examination at a health care facility -  Acute sinusitis, unspecified//chronic recurrent thought associated with AR - Plan: amoxicillin-clavulanate (AUGMENTIN) 875-125 MG per tablet, needs 20d course  DDD (degenerative disc disease), cervical - Plan: cyclobenzaprine (FLEXERIL) 10 MG tablet hs/exercises  Type II or unspecified type diabetes mellitus without mention of complication, not stated as uncontrolled - Plan: sitaGLIPtan-metformin (JANUMET) 50-1000 MG per tablet bid, increase glipiZIDE (GLUCOTROL) 10 MG tablet bid/diet/exercise/decrease stress/good sleep  HTN (hypertension)? Level of control - Plan: lisinopril (PRINIVIL,ZESTRIL) 10 MG tablet qd/outside BPs/?medincrease-this should be an action item at his next visit  Allergic conjunctivitis and rhinitis, bilateral - Plan: olopatadine (PATANOL) 0.1 % ophthalmic solution Has failed with antihist/nasal steroids/singulair/gets some relief vicks/greatly fatigued when occurs Possible sleep disruption  GERD (gastroesophageal reflux disease) - Plan: omeprazole (PRILOSEC) 40 MG capsule ?effect on AR sxt/ck hpyl IgM  Meds ordered this encounter  Medications  . lisinopril (PRINIVIL,ZESTRIL) 10 MG tablet    Sig: Take 1 tablet (10  mg total) by mouth daily.    Dispense:  90 tablet    Refill:  3  . sitaGLIPtan-metformin (JANUMET) 50-1000 MG per tablet    Sig: Take 1 tablet by mouth 2 (two) times daily with a meal.    Dispense:  180 tablet    Refill:  3  . amoxicillin-clavulanate (AUGMENTIN) 875-125 MG  per tablet    Sig: Take 1 tablet by mouth 2 (two) times daily. For 20 days    Dispense:  40 tablet    Refill:  0  . olopatadine (PATANOL) 0.1 % ophthalmic solution    Sig: Place 1 drop into both eyes 2 (two) times daily.    Dispense:  5 mL    Refill:  12  . omeprazole (PRILOSEC) 40 MG capsule    Sig: Take 1 capsule (40 mg total) by mouth daily.    Dispense:  90 capsule    Refill:  3  . cyclobenzaprine (FLEXERIL) 10 MG tablet    Sig: Take 1 tablet (10 mg total) by mouth at bedtime. For neck pain    Dispense:  30 tablet    Refill:  0  . glipiZIDE (GLUCOTROL) 10 MG tablet    Sig: Take 1 tablet (10 mg total) by mouth 2 (two) times daily before a meal.    Dispense:  180 tablet    Refill:  3  F/u 4 mos

## 2013-06-22 NOTE — Progress Notes (Signed)
  Subjective:    Patient ID: Jamie Foster, male    DOB: 1966/09/07, 47 y.o.   MRN: [id number]  HPI    Review of Systems  Constitutional: Positive for fatigue.  HENT: Positive for neck pain, voice change and sinus pressure.   Eyes: Positive for visual disturbance.  Cardiovascular: Negative.   Gastrointestinal: Positive for abdominal pain.  Endocrine: Negative.   Genitourinary: Negative.   Musculoskeletal: Positive for arthralgias.  Skin: Negative.   Allergic/Immunologic: Negative.   Neurological: Positive for light-headedness and headaches.  Hematological: Negative.   Psychiatric/Behavioral: Negative.    I have reviewed and agree with documentation. His abdominal pain is epigastric burning from reflux/his visual disturbances brief blurring after he is that never interferes with activity/his neck pain is related to known degenerative disease/his fatigue voice change and sinus pressure and headaches and lightheadedness are all related to his allergies as in the past Robert P. Merla Riches, M.D.     Objective:   Physical Exam        Assessment & Plan:

## 2013-06-23 LAB — PSA: PSA: 0.69 ng/mL (ref <=4.00)

## 2013-06-24 LAB — HELICOBACTER PYLORI  ANTIBODY, IGM: Helicobacter pylori, IgM: 0.4 U/mL (ref <9.0)

## 2013-09-17 ENCOUNTER — Encounter (HOSPITAL_COMMUNITY): Payer: Self-pay | Admitting: Emergency Medicine

## 2013-09-17 ENCOUNTER — Emergency Department (HOSPITAL_COMMUNITY)
Admission: EM | Admit: 2013-09-17 | Discharge: 2013-09-17 | Disposition: A | Discharge status: Home / Self Care | Payer: 59 | Source: Home / Self Care | Attending: Emergency Medicine | Admitting: Emergency Medicine

## 2013-09-17 DIAGNOSIS — M25519 Pain in unspecified shoulder: Secondary | ICD-10-CM

## 2013-09-17 DIAGNOSIS — M25512 Pain in left shoulder: Secondary | ICD-10-CM

## 2013-09-17 MED ORDER — ETODOLAC 500 MG PO TABS: 500.0000 mg | ORAL_TABLET | Freq: Two times a day (BID) | ORAL | Status: DC

## 2013-09-17 MED ORDER — HYDROCODONE-ACETAMINOPHEN 5-325 MG PO TABS: 1.0000 | ORAL_TABLET | Freq: Four times a day (QID) | ORAL | Status: DC | PRN

## 2013-09-17 MED ORDER — KETOROLAC TROMETHAMINE 60 MG/2ML IM SOLN
60.0000 mg | Freq: Once | INTRAMUSCULAR | Status: AC
  Administered 2013-09-17: 60 mg via INTRAMUSCULAR

## 2013-09-17 MED ORDER — KETOROLAC TROMETHAMINE 60 MG/2ML IM SOLN
INTRAMUSCULAR | Status: AC
  Filled 2013-09-17: qty 2

## 2013-09-17 MED ORDER — HYDROMORPHONE HCL 1 MG/ML IJ SOLN
1.0000 mg | Freq: Once | INTRAMUSCULAR | Status: AC
  Administered 2013-09-17: 1 mg via INTRAMUSCULAR

## 2013-09-17 MED ORDER — HYDROMORPHONE HCL PF 1 MG/ML IJ SOLN
INTRAMUSCULAR | Status: AC
  Filled 2013-09-17: qty 1
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
## 2013-09-17 NOTE — ED Provider Notes (Signed)
Medical screening examination/treatment/procedure(s) were performed by non-physician practitioner and as supervising physician I was immediately available for consultation/collaboration.  Jeanna Giuffre, M.D.  Hoang Reich C Shaylynne Lunt, MD 09/17/13 2050 

## 2013-10-12 ENCOUNTER — Ambulatory Visit (INDEPENDENT_AMBULATORY_CARE_PROVIDER_SITE_OTHER): Payer: 59 | Admitting: Internal Medicine

## 2013-10-12 ENCOUNTER — Encounter: Payer: Self-pay | Admitting: Internal Medicine

## 2013-10-12 VITALS — BP 140/92 | HR 93 | Temp 98.6°F | Resp 16 | Wt 247.2 lb

## 2013-10-12 DIAGNOSIS — E119 Type 2 diabetes mellitus without complications: Secondary | ICD-10-CM

## 2013-10-12 DIAGNOSIS — I10 Essential (primary) hypertension: Secondary | ICD-10-CM

## 2013-10-12 DIAGNOSIS — Z6830 Body mass index (BMI) 30.0-30.9, adult: Secondary | ICD-10-CM

## 2013-10-12 LAB — POCT GLYCOSYLATED HEMOGLOBIN (HGB A1C): Hemoglobin A1C: 9.7
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
## 2013-10-18 ENCOUNTER — Other Ambulatory Visit (HOSPITAL_COMMUNITY): Payer: Self-pay | Admitting: Orthopedic Surgery

## 2013-10-18 DIAGNOSIS — S139XXA Sprain of joints and ligaments of unspecified parts of neck, initial encounter: Secondary | ICD-10-CM

## 2013-10-18 DIAGNOSIS — M25511 Pain in right shoulder: Secondary | ICD-10-CM

## 2013-10-28 ENCOUNTER — Ambulatory Visit (HOSPITAL_COMMUNITY): Payer: 59

## 2013-10-28 ENCOUNTER — Ambulatory Visit (HOSPITAL_COMMUNITY)
Admission: RE | Admit: 2013-10-28 | Discharge: 2013-10-28 | Disposition: A | Discharge status: Home / Self Care | Payer: 59 | Source: Ambulatory Visit | Attending: Orthopedic Surgery | Admitting: Orthopedic Surgery

## 2013-10-28 DIAGNOSIS — S139XXA Sprain of joints and ligaments of unspecified parts of neck, initial encounter: Secondary | ICD-10-CM

## 2013-10-28 NOTE — Progress Notes (Signed)
Ptcalled 10/27/13 cancelled exams   States he cannot afford  MRI.

## 2013-12-19 ENCOUNTER — Telehealth: Payer: Self-pay | Admitting: Family Medicine

## 2013-12-19 ENCOUNTER — Ambulatory Visit (INDEPENDENT_AMBULATORY_CARE_PROVIDER_SITE_OTHER): Payer: 59 | Admitting: Internal Medicine

## 2013-12-19 VITALS — BP 126/78 | HR 111 | Temp 98.0°F | Resp 17 | Ht 74.5 in | Wt 237.0 lb

## 2013-12-19 DIAGNOSIS — E119 Type 2 diabetes mellitus without complications: Secondary | ICD-10-CM

## 2013-12-19 DIAGNOSIS — J111 Influenza due to unidentified influenza virus with other respiratory manifestations: Secondary | ICD-10-CM

## 2013-12-19 DIAGNOSIS — M25519 Pain in unspecified shoulder: Secondary | ICD-10-CM

## 2013-12-19 DIAGNOSIS — M542 Cervicalgia: Secondary | ICD-10-CM

## 2013-12-19 LAB — COMPREHENSIVE METABOLIC PANEL
ALT: 31 U/L (ref 0–53)
AST: 27 U/L (ref 0–37)
Albumin: 4.4 g/dL (ref 3.5–5.2)
Alkaline Phosphatase: 53 U/L (ref 39–117)
BUN: 13 mg/dL (ref 6–23)
CO2: 31 mEq/L (ref 19–32)
Calcium: 9.6 mg/dL (ref 8.4–10.5)
Chloride: 98 mEq/L (ref 96–112)
Creat: 1.09 mg/dL (ref 0.50–1.35)
Glucose, Bld: 367 mg/dL — ABNORMAL HIGH (ref 70–99)
Potassium: 4.4 mEq/L (ref 3.5–5.3)
Sodium: 136 mEq/L (ref 135–145)
Total Bilirubin: 0.4 mg/dL (ref 0.3–1.2)
Total Protein: 7.4 g/dL (ref 6.0–8.3)

## 2013-12-19 LAB — LIPID PANEL
Cholesterol: 184 mg/dL (ref 0–200)
HDL: 48 mg/dL (ref >39)
LDL Cholesterol: 111 mg/dL — ABNORMAL HIGH (ref 0–99)
Total CHOL/HDL Ratio: 3.8 Ratio
Triglycerides: 126 mg/dL (ref <150)
VLDL: 25 mg/dL (ref 0–40)

## 2013-12-19 LAB — POCT CBC
Granulocyte percent: 45.4 %G (ref 37–80)
HCT, POC: 45.6 % (ref 43.5–53.7)
Hemoglobin: 14.1 g/dL (ref 14.1–18.1)
Lymph, poc: 2 (ref 0.6–3.4)
MCH, POC: 30.2 pg (ref 27–31.2)
MCHC: 30.9 g/dL — AB (ref 31.8–35.4)
MCV: 97.7 fL — AB (ref 80–97)
MID (cbc): 0.7 (ref 0–0.9)
MPV: 9.2 fL (ref 0–99.8)
POC Granulocyte: 2.2 (ref 2–6.9)
POC LYMPH PERCENT: 40.9 %L (ref 10–50)
POC MID %: 13.7 %M — AB (ref 0–12)
Platelet Count, POC: 216 10*3/uL (ref 142–424)
RBC: 4.67 M/uL — AB (ref 4.69–6.13)
RDW, POC: 13.2 %
WBC: 4.8 10*3/uL (ref 4.6–10.2)

## 2013-12-19 LAB — POCT GLYCOSYLATED HEMOGLOBIN (HGB A1C): Hemoglobin A1C: 11.2

## 2013-12-19 MED ORDER — OSELTAMIVIR PHOSPHATE 75 MG PO CAPS: 75.0000 mg | ORAL_CAPSULE | Freq: Two times a day (BID) | ORAL | Status: DC

## 2013-12-19 MED ORDER — HYDROCODONE-HOMATROPINE 5-1.5 MG/5ML PO SYRP: 5.0000 mL | ORAL_SOLUTION | Freq: Four times a day (QID) | ORAL | Status: DC | PRN
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
## 2013-12-19 NOTE — Telephone Encounter (Signed)
Pt called stating we called him, i called [facility] they have no record of calling him. I explained this to the patient and that they may have called him to schedule an appointment and that he did not have an appointment scheduled for today.  i asked if he wanted to schedule an appointment at [facility], he said he had an appointment, i stated i did not see anything scheduled for him. He stated he was Dr. Netta Corriganoolittle's patient and was in bad shape and needed to see him, he will only see him. I  Told him dr Merla Richesdoolittle will be working today from 2:00 pm to close and he was welcome to come in to see him.  He wanted to schedule an appointment with him today and i explained to him this is [facility] the walk in clinic, so it will be first come first served that he is more than welcome to come in to be seen, at this point he asked to speak to the manager i told him t was tracy hall, tried to transfer with no answer.  i told him tracy was away from her desk, but i could send him to her voice mail and she would return his call.

## 2013-12-21 ENCOUNTER — Encounter: Payer: Self-pay | Admitting: Internal Medicine

## 2013-12-21 ENCOUNTER — Ambulatory Visit (INDEPENDENT_AMBULATORY_CARE_PROVIDER_SITE_OTHER): Payer: 59 | Admitting: Internal Medicine

## 2013-12-21 VITALS — BP 144/90 | HR 98 | Temp 99.6°F | Resp 16 | Ht 74.0 in | Wt 238.4 lb

## 2013-12-21 DIAGNOSIS — J111 Influenza due to unidentified influenza virus with other respiratory manifestations: Secondary | ICD-10-CM

## 2013-12-21 DIAGNOSIS — E119 Type 2 diabetes mellitus without complications: Secondary | ICD-10-CM

## 2013-12-21 DIAGNOSIS — IMO0002 Reserved for concepts with insufficient information to code with codable children: Secondary | ICD-10-CM

## 2013-12-21 DIAGNOSIS — E1365 Other specified diabetes mellitus with hyperglycemia: Secondary | ICD-10-CM

## 2013-12-21 DIAGNOSIS — J019 Acute sinusitis, unspecified: Secondary | ICD-10-CM

## 2013-12-21 MED ORDER — AZITHROMYCIN 250 MG PO TABS: ORAL_TABLET | ORAL | Status: DC

## 2013-12-23 NOTE — Progress Notes (Signed)
Followup DM (diabetes mellitus), secondary uncontrolled  It's hard to get a clear picture of what he actually does with his monitoring and medication intake though he indicates that he has been more persistent at staying on schedule He says that when he takes Janumet and glipizide in the morning that he gets weak and hungry that morning and has to eat to make his symptoms disappear This does not occur when he takes this at the dinner meal He has not tested to see if his sugar is low midmorning See his visit 2 days ago for flu/he continues to be at work and very symptomatic and now has a purulent nasal discharge with a nonproductive cough that is painful  Patient Active Problem List   Diagnosis Date Noted  . Family hx of prostate cancer 06/22/2013  . Allergic conjunctivitis 06/22/2013  . Allergic rhinitis 06/22/2013  . OSA (obstructive sleep apnea) 10/06/2012  . DM (diabetes mellitus) 07/19/2012  . BMI 30.0-30.9,adult 07/19/2012  . HTN (hypertension) 07/19/2012  . GERD (gastroesophageal reflux disease) 07/19/2012  . DDD (degenerative disc disease), cervical 07/19/2012  . Sinusitis, chronic 07/19/2012   Exam Purulent nasal discharge with tender maxillary areas Throat clear No nodes Chest clear Foot exam normal   DM (diabetes mellitus), secondary uncontrolled--- will set up an endocrine evaluation as I cannot get his diabetes controlled  Flu  Acute sinusitis, unspecified  Meds ordered this encounter  Medications  . azithromycin (ZITHROMAX) 250 MG tablet    Sig: As packaged    Dispense:  6 tablet    Refill:  0   Current outpatient prescriptions: glipiZIDE (GLUCOTROL) 10 MG tablet, Take 1 tablet (10 mg total) by mouth 2 (two) times daily before a meal--- change this to take 1 tablet a day at supper lisinopril (PRINIVIL,ZESTRIL) 10 MG tablet, Take 1 tablet (10 mg total) by mouth daily sitaGLIPtan-metformin (JANUMET) 50-1000 MG per tablet, Take 1 tablet by mouth 2 (two) times  daily with a meal  cyclobenzaprine (FLEXERIL) 10 MG tablet, Take 1 tablet (10 mg total) by mouth at bedtime-4 neck pain omeprazole (PRILOSEC) 40 MG capsule, Take 1 capsule (40 mg total) by mouth daily., Disp: 90 capsule, Rfl: 3

## 2013-12-28 ENCOUNTER — Encounter: Payer: Self-pay | Admitting: *Deleted

## 2014-02-01 ENCOUNTER — Encounter: Payer: Self-pay | Admitting: Internal Medicine

## 2014-02-01 ENCOUNTER — Telehealth: Payer: Self-pay | Admitting: Radiology

## 2014-02-01 ENCOUNTER — Ambulatory Visit (INDEPENDENT_AMBULATORY_CARE_PROVIDER_SITE_OTHER): Payer: 59 | Admitting: Internal Medicine

## 2014-02-01 VITALS — BP 160/110 | HR 73 | Temp 97.7°F | Resp 16 | Ht 74.0 in | Wt 233.0 lb

## 2014-02-01 DIAGNOSIS — J329 Chronic sinusitis, unspecified: Secondary | ICD-10-CM

## 2014-02-01 DIAGNOSIS — H1010 Acute atopic conjunctivitis, unspecified eye: Secondary | ICD-10-CM

## 2014-02-01 DIAGNOSIS — I10 Essential (primary) hypertension: Secondary | ICD-10-CM

## 2014-02-01 DIAGNOSIS — E119 Type 2 diabetes mellitus without complications: Secondary | ICD-10-CM

## 2014-02-01 DIAGNOSIS — Z6830 Body mass index (BMI) 30.0-30.9, adult: Secondary | ICD-10-CM

## 2014-02-01 DIAGNOSIS — J309 Allergic rhinitis, unspecified: Secondary | ICD-10-CM

## 2014-02-01 MED ORDER — AMOXICILLIN-POT CLAVULANATE 875-125 MG PO TABS: 1.0000 | ORAL_TABLET | Freq: Three times a day (TID) | ORAL | Status: DC

## 2014-02-01 NOTE — Telephone Encounter (Signed)
Patient has sinus congestion, states from his allergies. He walked in to 104 wanting to be seen, advised him no appointments available. He is scheduled for follow up on April 29th.  He wants to know if you can recommend anything else for his sinuses, he indicates no relief with Mucinex, and Flonase, he indicates he has tried in the past. His call back number is [phone]

## 2014-02-01 NOTE — Progress Notes (Signed)
This chart was scribed for Tonye Pearsonobert P Doolittle, MD by Joaquin MusicKristina Sanchez-Matthews, ED Scribe. This patient was seen in room Room/bed 24 and the patient's care was started at 4:54 PM. Subjective:    Patient ID: Jamie Foster, male    DOB: 07-May-1966, 48 y.o.   MRN: [id number] Chief Complaint  Patient presents with   Sinus Problem    with nasal congestion   Cough    productive   Sinus Problem Associated symptoms include congestion, coughing, sinus pressure and a sore throat.  Cough Associated symptoms include a sore throat.   Jamie Foster is a 48 y.o. male who [facility] complaining of ongoing productive cough, sinus congestion, and dark brown sputum that began 2 days ago. Pt states he has not been able to get adequate sleep due to productive cough and dark brown sputum. He states he has been doing salt water gargles. Pt states he feels his throat is tight. Pt denies using an inhaler. Note history of chronic sinusitis with allergic rhinitis--no response to nasal sprays. No response to Singulair or Zyrtec.  Pt states due to not having normal BM, he stopped taking his medications 3 weeks ago to see how his body would react. Pt states began taking Glucophage and it increased his BM and his nausea. He states he began taking Januvia 2 days ago and noticed his sensation return to the R side of sole of foot. Pt states he would like to reduce his medications to "zero". He states he has noticed the color of his stools is now returning to normal. He will be touching soccer and increasing his exercise and try to replicate his effort and no country.over the next 8 weeks and we will reconsider medications at that point. He doesn't want to take anything by mouth and he doesn't want to take any injections so his options are limited.   Current outpatient prescriptions:sitaGLIPtan-metformin (JANUMET) 50-1000 MG per tablet, Take 1 tablet by mouth 2 (two) times daily with a meal., Disp: 180 tablet, Rfl:  3;  azithromycin (ZITHROMAX) 250 MG tablet, As packaged, Disp: 6 tablet, Rfl: 0;  cyclobenzaprine (FLEXERIL) 10 MG tablet, Take 1 tablet (10 mg total) by mouth at bedtime. For neck pain, Disp: 30 tablet, Rfl: 0 glipiZIDE (GLUCOTROL) 10 MG tablet, Take 1 tablet (10 mg total) by mouth 2 (two) times daily before a meal., Disp: 180 tablet, Rfl: 3;  HYDROcodone-homatropine (HYCODAN) 5-1.5 MG/5ML syrup, Take 5 mLs by mouth every 6 (six) hours as needed for cough., Disp: 120 mL, Rfl: 0;  lisinopril (PRINIVIL,ZESTRIL) 10 MG tablet, Take 1 tablet (10 mg total) by mouth daily., Disp: 90 tablet, Rfl: 3 olopatadine (PATANOL) 0.1 % ophthalmic solution, Place 1 drop into both eyes 2 (two) times daily., Disp: 5 mL, Rfl: 12;  omeprazole (PRILOSEC) 40 MG capsule, Take 1 capsule (40 mg total) by mouth daily., Disp: 90 capsule, Rfl: 3;  oseltamivir (TAMIFLU) 75 MG capsule, Take 1 capsule (75 mg total) by mouth 2 (two) times daily., Disp: 10 capsule, Rfl: 0;  TRUETRACK TEST test strip, TEST ONCE DAILY AS INSTRUCTED., Disp: 50 each, Rfl: 2  Patient Active Problem List   Diagnosis Date Noted   Family hx of prostate cancer 06/22/2013   Allergic conjunctivitis 06/22/2013   Allergic rhinitis 06/22/2013   OSA (obstructive sleep apnea) 10/06/2012   DM (diabetes mellitus) 07/19/2012   BMI 30.0-30.9,adult 07/19/2012   HTN (hypertension) 07/19/2012   GERD (gastroesophageal reflux disease) 07/19/2012   DDD (degenerative disc disease), cervical  07/19/2012   Sinusitis, chronic 07/19/2012    Review of Systems  HENT: Positive for congestion, sinus pressure and sore throat.   Respiratory: Positive for cough.   Psychiatric/Behavioral: Positive for sleep disturbance.   Objective:   Physical Exam  Nursing note and vitals reviewed. Constitutional: He is oriented to person, place, and time. He appears well-developed and well-nourished. No distress.  HENT:  Head: Normocephalic.  Right Ear: External ear normal.  Left  Ear: External ear normal.  Mouth/Throat: Oropharynx is clear and moist.  Purulent discharge to nares. Ears clear. Throat is slightly red.  Eyes: EOM are normal. Pupils are equal, round, and reactive to light.  Neck: Neck supple.  Cardiovascular: Normal rate, regular rhythm and normal heart sounds.   Pulmonary/Chest: Effort normal. He has wheezes.  Mild wheezing with forced expiration.  Neurological: He is alert and oriented to person, place, and time.  Psychiatric: He has a normal mood and affect. His behavior is normal. Thought content normal.   Assessment & Plan:    I have completed the patient encounter in its entirety as documented by the scribe, with editing by me where necessary. Robert P. Merla Riches, M.D.  BMI 30.0-30.9,adult  DM (diabetes mellitus)  HTN (hypertension)  Sinusitis, chronic  Allergic conjunctivitis  Allergic rhinitis  Meds ordered this encounter  Medications   amoxicillin-clavulanate (AUGMENTIN) 875-125 MG per tablet    Sig: Take 1 tablet by mouth 3 (three) times daily.    Dispense:  30 tablet    Refill:  0   Followup in April

## 2014-02-01 NOTE — Telephone Encounter (Signed)
You can disregard the message patient has seated himself in the lobby and will not leave until he has seen Dr Merla Richesoolittle. Dr Merla Richesoolittle kindly agreed to see patient.

## 2014-02-05 ENCOUNTER — Emergency Department (HOSPITAL_COMMUNITY)
Admission: EM | Admit: 2014-02-05 | Discharge: 2014-02-05 | Disposition: A | Discharge status: Home / Self Care | Payer: 59 | Source: Home / Self Care | Attending: Family Medicine | Admitting: Family Medicine

## 2014-02-05 ENCOUNTER — Encounter (HOSPITAL_COMMUNITY): Payer: Self-pay | Admitting: Emergency Medicine

## 2014-02-05 DIAGNOSIS — M7500 Adhesive capsulitis of unspecified shoulder: Secondary | ICD-10-CM

## 2014-02-05 DIAGNOSIS — M7501 Adhesive capsulitis of right shoulder: Secondary | ICD-10-CM

## 2014-02-05 MED ORDER — HYDROCODONE-ACETAMINOPHEN 5-325 MG PO TABS: 1.0000 | ORAL_TABLET | Freq: Four times a day (QID) | ORAL | Status: DC | PRN

## 2014-02-05 MED ORDER — DICLOFENAC SODIUM 75 MG PO TBEC: 75.0000 mg | DELAYED_RELEASE_TABLET | Freq: Two times a day (BID) | ORAL | Status: DC

## 2014-02-05 MED ORDER — PREDNISONE 10 MG PO KIT: PACK | ORAL | Status: DC

## 2014-02-05 NOTE — ED Notes (Signed)
Pt triaged and assessed by provider.   Provider in before nurse. 

## 2014-02-05 NOTE — Discharge Instructions (Signed)
Adhesive Capsulitis Sometimes the shoulder becomes stiff and is painful to move. Some people say it feels as if the shoulder is frozen in place. Because of this, the condition is called "frozen shoulder." Its medical name is adhesive capsulitis.  The shoulder joint is made up of strong connective tissue that attaches the ball of the humerus to the shallow shoulder socket. This strong connective tissue is called the joint capsule. This tissue can become stiff and swollen. That is when adhesive capsulitis sets in. CAUSES  It is not always clear just what the cause adhesive capsulitis. Possibilities include:  Injury to the shoulder joint.  Strain. This is a repetitive injury brought about by overuse.  Lack of use. Perhaps your arm or hand was otherwise injured. It might have been in a sling for awhile. Or perhaps you were not using it to avoid pain.  Referred pain. This is a sort of trick the body plays. You feel pain in the shoulder. But, the pain actually comes from an injury somewhere else in the body.  Long-standing health problems. Several diseases can cause adhesive capsulitis. They include diabetes, heart disease, stroke, thyroid problems, rheumatoid arthritis and lung disease.  Being a women older than 40. Anyone can develop adhesive capsulitis but it is most common in women in this age group. SYMPTOMS   Pain.  It occurs when the arm is moved.  Parts of the shoulder might hurt if they are touched.  Pain is worse at night or when resting.  Soreness. It might not be strong enough to be called pain. But, the shoulder aches.  The shoulder does not move freely.  Muscle spasms.  Trouble sleeping because of shoulder ache or pain. DIAGNOSIS  To decide if you have adhesive capsulitis, your healthcare provider will probably:  Ask about symptoms you have noticed.  Ask about your history of joint pain and anything that might have caused the pain.  Ask about your overall  health.  Use hands to feel your shoulder and neck.  Ask you to move your shoulder in specific directions. This may indicate the origin of the pain.  Order imaging tests; pictures of the shoulder. They help pinpoint the source of the problem. An X-ray might be used. For more detail, an MRI is often used. An MRI details the tendons, muscles and ligaments as well as the joint. TREATMENT  Adhesive capsulitis can be treated several ways. Most treatments can be done in a clinic or in your healthcare provider's office. Be sure to discuss the different options with your caregiver. They include:  Physical therapy. You will work on specific exercises to get your shoulder moving again. The exercises usually involve stretching. A physical therapist (a caregiver with special training) can show you what to do and what not to do. The exercises will need to be done daily.  Medication.  Over-the-counter medicines may relieve pain and inflammation (the body's way of reacting to injury or infection).  Corticosteroids. These are stronger drugs to reduce pain and inflammation. They are given by injection (shots) into the shoulder joint. Frequent treatment is not recommended.  Muscle relaxants. Medication may be prescribed to ease muscle spasms.  Treatment of underlying conditions. This means treating another condition that is causing your shoulder problem. This might be a rotator cuff (tendon) problem  Shoulder manipulation. The shoulder will be moved by your healthcare provider. You would be under general anesthesia (given a drug that puts you to sleep). You would not feel anything. Sometimes   the joint will be injected with salt water (saline) at high pressure to break down internal scarring in the joint capsule.  Surgery. This is rarely needed. It may be suggested in advanced cases after all other treatment has failed. PROGNOSIS  In time, most people recover from adhesive capsulitis. Sometimes, however, the  pain goes away but full movement of the shoulder does not return.  HOME CARE INSTRUCTIONS   Take any pain medications recommended by your healthcare provider. Follow the directions carefully.  If you have physical therapy, follow through with the therapist's suggestions. Be sure you understand the exercises you will be doing. You should understand:  How often the exercises should be done.  How many times each exercise should be repeated.  How long they should be done.  What other activities you should do, or not do.  That you should warm up before doing any exercise. Just 5 to 10 minutes will help. Small, gentle movements should get your shoulder ready for more.  Avoid high-demand exercise that involves your shoulder such as throwing. This type of exercise can make pain worse.  Consider using cold packs. Cold may ease swelling and pain. Ask your healthcare provider if a cold pack might help you. If so, get directions on how and when to use them. SEEK MEDICAL CARE IF:   You have any questions about your medications.  Your pain continues to increase. Document Released: 09/07/2009 Document Revised: 02/02/2012 Document Reviewed: 09/07/2009 [facility] Patient Information 2014 [place], [place].  Shoulder Range of Motion Exercises The shoulder is the most flexible joint in the human body. Because of this it is also the most unstable joint in the body. All ages can develop shoulder problems. Early treatment of problems is necessary for a good outcome. People react to shoulder pain by decreasing the movement of the joint. After a brief period of time, the shoulder can become "frozen". This is an almost complete loss of the ability to move the damaged shoulder. Following injuries your caregivers can give you instructions on exercises to keep your range of motion (ability to move your shoulder freely), or regain it if it has been lost.  EXERCISES EXERCISES TO MAINTAIN THE MOBILITY OF YOUR  SHOULDER: Codman's Exercise or Pendulum Exercise  This exercise may be performed in a prone (face-down) lying position or standing while leaning on a chair with the opposite arm. Its purpose is to relax the muscles in your shoulder and slowly but surely increase the range of motion and to relieve pain.  Lie on your stomach close to the side edge of the bed. Let your weak arm hang over the edge of the bed. Relax your shoulder, arm and hand. Let your shoulder blade relax and drop down.  Slowly and gently swing your arm forward and back. Do not use your neck muscles; relax them. It might be easier to have someone else gently start swinging your arm.  As pain decreases, increase your swing. To start, arm swing should begin at 15 degree angles. In time and as pain lessens, move to 30-45 degree angles. Start with swinging for about 15 seconds, and work towards swinging for 3 to 5 minutes.  This exercise may also be performed in a standing/bent over position.  Stand and hold onto a sturdy chair with your good arm. Bend forward at the waist and bend your knees slightly to help protect your back. Relax your weak arm, let it hang limp. Relax your shoulder blade and let it drop.  Keep your shoulder relaxed and use body motion to swing your arm in small circles.  Stand up tall and relax.  Repeat motion and change direction of circles.  Start with swinging for about 30 seconds, and work towards swinging for 3 to 5 minutes. STRETCHING EXERCISES:  Lift your arm out in front of you with the elbow bent at 90 degrees. Using your other arm gently pull the elbow forward and across your body.  Bend one arm behind you with the palm facing outward. Using the other arm, hold a towel or rope and reach this arm up above your head, then bend it at the elbow to move your wrist to behind your neck. Grab the free end of the towel with the hand behind your back. Gently pull the towel up with the hand behind your neck,  gradually increasing the pull on the hand behind the small of your back. Then, gradually pull down with the hand behind the small of your back. This will pull the hand and arm behind your neck further. Both shoulders will have an increased range of motion with repetition of this exercise. STRENGTHENING EXERCISES:  Standing with your arm at your side and straight out from your shoulder with the elbow bent at 90 degrees, hold onto a small weight and slowly raise your hand so it points straight up in the air. Repeat this five times to begin with, and gradually increase to ten times. Do this four times per day. As you grow stronger you can gradually increase the weight.  Repeat the above exercise, only this time using an elastic band. Start with your hand up in the air and pull down until your hand is by your side. As you grow stronger, gradually increase the amount you pull by increasing the number or size of the elastic bands. Use the same amount of repetitions.  Standing with your hand at your side and holding onto a weight, gradually lift the hand in front of you until it is over your head. Do the same also with the hand remaining at your side and lift the hand away from your body until it is again over your head. Repeat this five times to begin with, and gradually increase to ten times. Do this four times per day. As you grow stronger you can gradually increase the weight. Document Released: 08/09/2003 Document Revised: 02/02/2012 Document Reviewed: 11/10/2005 [facility] Patient Information 2014 [place], [place].

## 2014-02-05 NOTE — ED Provider Notes (Signed)
CSN: [id number]     Arrival date & time 02/05/14  1851 History   First MD Initiated Contact with Patient 02/05/14 1958     Chief Complaint  Patient presents with  . Shoulder Pain   (Consider location/radiation/quality/duration/timing/severity/associated sxs/prior Treatment) HPI Comments: 48 year old male presents complaining of right shoulder pain. This has been present since he had an injection in the shoulder to orthopedics for arthritis 4 months ago. Since that time, he has had constant pain in the shoulder. He has increasingly limited range of motion as well. Shoulder aches constantly and is worse with any movement. She denies any numbness in the extremity. He has never had this problem before. The left shoulder that he was seen for her previously has improved significantly. No known injury to the shoulder other than the injection.   Past Medical History  Diagnosis Date  . Hyperlipidemia   . Hypertension   . Allergy   . Kidney stone   . H pylori ulcer   . Diabetes mellitus without complication    Past Surgical History  Procedure Laterality Date  . Nasal sinus surgery     Family History  Problem Relation Age of Onset  . Hypertension Mother    History  Substance Use Topics  . Smoking status: Never Smoker   . Smokeless tobacco: Never Used  . Alcohol Use: No    Review of Systems  Constitutional: Negative for fever, chills and fatigue.  HENT: Negative for sore throat.   Eyes: Negative for visual disturbance.  Respiratory: Negative for cough and shortness of breath.   Cardiovascular: Negative for chest pain, palpitations and leg swelling.  Gastrointestinal: Negative for nausea, vomiting, abdominal pain, diarrhea and constipation.  Genitourinary: Negative for dysuria, urgency, frequency and hematuria.  Musculoskeletal: Positive for arthralgias. Negative for myalgias, neck pain and neck stiffness.       See history of present illness  Skin: Negative for rash.  Neurological:  Negative for dizziness, weakness and light-headedness.    Allergies  Review of patient's allergies indicates no known allergies.  Home Medications   Current Outpatient Rx  Name  Route  Sig  Dispense  Refill  . amoxicillin-clavulanate (AUGMENTIN) 875-125 MG per tablet   Oral   Take 1 tablet by mouth 3 (three) times daily.   30 tablet   0   . lisinopril (PRINIVIL,ZESTRIL) 10 MG tablet   Oral   Take 1 tablet (10 mg total) by mouth daily.   90 tablet   3   . azithromycin (ZITHROMAX) 250 MG tablet      As packaged   6 tablet   0   . cyclobenzaprine (FLEXERIL) 10 MG tablet   Oral   Take 1 tablet (10 mg total) by mouth at bedtime. For neck pain   30 tablet   0   . diclofenac (VOLTAREN) 75 MG EC tablet   Oral   Take 1 tablet (75 mg total) by mouth 2 (two) times daily.   60 tablet   0   . HYDROcodone-acetaminophen (NORCO) 5-325 MG per tablet   Oral   Take 1 tablet by mouth every 6 (six) hours as needed for moderate pain.   10 tablet   0   . HYDROcodone-homatropine (HYCODAN) 5-1.5 MG/5ML syrup   Oral   Take 5 mLs by mouth every 6 (six) hours as needed for cough.   120 mL   0   . olopatadine (PATANOL) 0.1 % ophthalmic solution   Both Eyes   Place 1  drop into both eyes 2 (two) times daily.   5 mL   12   . omeprazole (PRILOSEC) 40 MG capsule   Oral   Take 1 capsule (40 mg total) by mouth daily.   90 capsule   3   . oseltamivir (TAMIFLU) 75 MG capsule   Oral   Take 1 capsule (75 mg total) by mouth 2 (two) times daily.   10 capsule   0   . PredniSONE 10 MG KIT      12 day taper dose pack. Use as directed   48 each   0   . TRUETRACK TEST test strip      TEST ONCE DAILY AS INSTRUCTED.   50 each   2    BP 164/103  Pulse 97  Temp(Src) 98.5 F (36.9 C) (Oral)  Resp 18  SpO2 98% Physical Exam  Nursing note and vitals reviewed. Constitutional: He is oriented to person, place, and time. He appears well-developed and well-nourished. No distress.   HENT:  Head: Normocephalic.  Cardiovascular:  Pulses:      Radial pulses are 2+ on the right side, and 2+ on the left side.  Pulmonary/Chest: Effort normal. No respiratory distress.  Musculoskeletal:       Right shoulder: He exhibits decreased range of motion (Globally decreased active and passive range of motion of the right shoulder) and tenderness (Diffuse, no point tenderness). He exhibits no bony tenderness, no swelling, no crepitus and no deformity.  Neurological: He is alert and oriented to person, place, and time. No sensory deficit. Coordination normal.  Skin: Skin is warm and dry. No rash noted. He is not diaphoretic.  Psychiatric: He has a normal mood and affect. Judgment normal.    ED Course  Procedures (including critical care time) Labs Review Labs Reviewed - No data to display Imaging Review No results found.   MDM   1. Adhesive capsulitis of right shoulder     Range of motion exercises, steroids. Diclofenac to start after the prednisone.  Followup with orthopedics if not improving  Meds ordered this encounter  Medications  . PredniSONE 10 MG KIT    Sig: 12 day taper dose pack. Use as directed    Dispense:  48 each    Refill:  0    Order Specific Question:  Supervising Provider    Answer:  Billy Fischer [phone]  . diclofenac (VOLTAREN) 75 MG EC tablet    Sig: Take 1 tablet (75 mg total) by mouth 2 (two) times daily.    Dispense:  60 tablet    Refill:  0    Order Specific Question:  Supervising Provider    Answer:  Billy Fischer [phone]  . HYDROcodone-acetaminophen (NORCO) 5-325 MG per tablet    Sig: Take 1 tablet by mouth every 6 (six) hours as needed for moderate pain.    Dispense:  10 tablet    Refill:  0    Order Specific Question:  Supervising Provider    Answer:  Ihor Gully D Hinton, PA-C 02/06/14 [id number]

## 2014-02-07 NOTE — ED Provider Notes (Signed)
Medical screening examination/treatment/procedure(s) were performed by resident physician or non-physician practitioner and as supervising physician I was immediately available for consultation/collaboration.   Nickia Boesen DOUGLAS MD.   Emori Kamau D Aydin Cavalieri, MD 02/07/14 0803 

## 2014-02-08 ENCOUNTER — Encounter: Payer: Self-pay | Admitting: *Deleted

## 2014-03-22 ENCOUNTER — Ambulatory Visit: Payer: 59 | Admitting: Internal Medicine

## 2014-06-09 ENCOUNTER — Encounter: Payer: Self-pay | Admitting: Internal Medicine

## 2014-06-09 ENCOUNTER — Ambulatory Visit (INDEPENDENT_AMBULATORY_CARE_PROVIDER_SITE_OTHER): Payer: 59 | Admitting: Internal Medicine

## 2014-06-09 VITALS — BP 160/84 | HR 78 | Temp 98.4°F | Resp 12 | Ht 73.75 in | Wt 231.0 lb

## 2014-06-09 DIAGNOSIS — IMO0002 Reserved for concepts with insufficient information to code with codable children: Secondary | ICD-10-CM

## 2014-06-09 DIAGNOSIS — E1139 Type 2 diabetes mellitus with other diabetic ophthalmic complication: Secondary | ICD-10-CM

## 2014-06-09 DIAGNOSIS — E11319 Type 2 diabetes mellitus with unspecified diabetic retinopathy without macular edema: Secondary | ICD-10-CM

## 2014-06-09 DIAGNOSIS — E1165 Type 2 diabetes mellitus with hyperglycemia: Principal | ICD-10-CM

## 2014-06-09 MED ORDER — GLUCOSE BLOOD VI STRP: ORAL_STRIP | Status: DC

## 2014-06-09 MED ORDER — SITAGLIPTIN PHOSPHATE 100 MG PO TABS: 100.0000 mg | ORAL_TABLET | Freq: Every day | ORAL | Status: DC

## 2014-06-09 MED ORDER — WALGREENS LANCETS MISC: Status: DC

## 2014-06-09 MED ORDER — GLUMETZA 500 MG PO TB24: 500.0000 mg | ORAL_TABLET | Freq: Every day | ORAL | Status: DC

## 2014-06-09 NOTE — Patient Instructions (Signed)
Please start Glumetza 500 mg with dinner x 3-4 days. If you tolerate this well, add another Metformin tablet (500 mg) with breakfast x 3-4 days. If you tolerate this well, add another metformin tablet with dinner (total 1000 mg) x 3-4 days. If you tolerate this well, add another metformin tablets with breakfast (total 1000 mg). Continue with 1000 mg of metformin 2x a day with breakfast and dinner. Start Januvia 100 mg daily. Continue Glipizide 10 mg 2x a day.  Please call me if sugars are consistently <80s or >200s.  Please return in 1 month with your sugar log.   PATIENT INSTRUCTIONS FOR TYPE 2 DIABETES:  **Please join MyChart!** - see attached instructions about how to join if you have not done so already.  DIET AND EXERCISE Diet and exercise is an important part of diabetic treatment.  We recommended aerobic exercise in the form of brisk walking (working between 40-60% of maximal aerobic capacity, similar to brisk walking) for 150 minutes per week (such as 30 minutes five days per week) along with 3 times per week performing 'resistance' training (using various gauge rubber tubes with handles) 5-10 exercises involving the major muscle groups (upper body, lower body and core) performing 10-15 repetitions (or near fatigue) each exercise. Start at half the above goal but build slowly to reach the above goals. If limited by weight, joint pain, or disability, we recommend daily walking in a swimming pool with water up to waist to reduce pressure from joints while allow for adequate exercise.    BLOOD GLUCOSES Monitoring your blood glucoses is important for continued management of your diabetes. Please check your blood glucoses 2-4 times a day: fasting, before meals and at bedtime (you can rotate these measurements - e.g. one day check before the 3 meals, the next day check before 2 of the meals and before bedtime, etc.).   HYPOGLYCEMIA (low blood sugar) Hypoglycemia is usually a reaction to not  eating, exercising, or taking too much insulin/ other diabetes drugs.  Symptoms include tremors, sweating, hunger, confusion, headache, etc. Treat IMMEDIATELY with 15 grams of Carbs:   4 glucose tablets    cup regular juice/soda   2 tablespoons raisins   4 teaspoons sugar   1 tablespoon honey Recheck blood glucose in 15 mins and repeat above if still symptomatic/blood glucose <100.  RECOMMENDATIONS TO REDUCE YOUR RISK OF DIABETIC COMPLICATIONS: * Take your prescribed MEDICATION(S) * Follow a DIABETIC diet: Complex carbs, fiber rich foods, (monounsaturated and polyunsaturated) fats * AVOID saturated/trans fats, high fat foods, >2,300 mg salt per day. * EXERCISE at least 5 times a week for 30 minutes or preferably daily.  * DO NOT SMOKE OR DRINK more than 1 drink a day. * Check your FEET every day. Do not wear tightfitting shoes. Contact us if you develop an ulcer * See your EYE doctor once a year or more if needed * Get a FLU shot once a year * Get a PNEUMONIA vaccine once before and once after age 50 years  GOALS:  * Your Hemoglobin A1c of <7%  * fasting sugars need to be <130 * after meals sugars need to be <180 (2h after you start eating) * Your Systolic BP should be 140 or lower  * Your Diastolic BP should be 80 or lower  * Your HDL (Good Cholesterol) should be 40 or higher  * Your LDL (Bad Cholesterol) should be 100 or lower. * Your Triglycerides should be 150 or lower  * Your  Urine microalbumin (kidney function) should be <30 * Your Body Mass Index should be 25 or lower   We will be glad to help you achieve these goals. Our telephone number is: [phone].

## 2014-06-09 NOTE — Progress Notes (Signed)
Patient ID: Jamie Foster, male   DOB: 1966-07-28, 48 y.o.   MRN: [id number]  HPI: Jamie Foster is a 48 y.o.-year-old male, referred by his PCP, Dr. Laney Pastor, for management of DM2, non-insulin-dependent, uncontrolled, without complications.  Patient has been diagnosed with diabetes in 2006; he has not been on insulin before.  Last hemoglobin A1c was: Lab Results  Component Value Date   HGBA1C 11.2 12/19/2013   HGBA1C 9.7 10/12/2013   HGBA1C 8.5 06/22/2013  He had 1 steroid inj in his R shoulder this year and 1 last year. He also had a prednisone taper 12/2013.   Pt is on a regimen of: - Glipizide 10 mg bid He was JanuMet 50/1000 mg bid >> diarrhea and abdominal pain >> stopped 2 mo ago.  Pt checks his sugars once a day and they are usually 200-250: - am: 125-300s - 2h after b'fast: n/c - before lunch: n/c - 2h after lunch: n/c - before dinner: n/c - 2h after dinner: n/c - bedtime: 200s-300s No lows. Lowest sugar was 120; has hypoglycemia awareness - ? level Highest sugar was 500s  He has a Leisure centre manager.  Pt's meals are: - Breakfast: oatmeal + 50-50% milk  - Lunch: steak + veggies; sometimes rice, veggies, chicken, tomato stew - Dinner: fufu gari-pounded yam: vegetable soup  - Snacks: no No sodas; alcohol only at parties  - Has mild CKD, last BUN/creatinine:  Lab Results  Component Value Date   BUN 13 12/19/2013   CREATININE 1.09 12/19/2013  On Lisinopril 10 - last set of lipids: Lab Results  Component Value Date   CHOL 184 12/19/2013   HDL 48 12/19/2013   LDLCALC 111* 12/19/2013   TRIG 126 12/19/2013   CHOLHDL 3.8 12/19/2013  Not on a statin. - last eye exam was this year + Dr Saunders Glance - no numbness and tingling in his feet.  I reviewed his chart and he also has a history of OSA, GERD, HTN, HL, H pylori ulcer.  Pt has no FH of DM.  ROS: Constitutional: no weight gain/loss, + fatigue, no subjective hyperthermia/hypothermia, + poor sleep Eyes: + blurry vision,  no xerophthalmia ENT: no sore throat, no nodules palpated in throat, no dysphagia/odynophagia, no hoarseness Cardiovascular: no CP/SOB/palpitations/leg swelling Respiratory: no cough/SOB Gastrointestinal: no N/V/+D/no C, + heartburn Musculoskeletal: no muscle/joint aches Skin: no rashes Neurological: no tremors/numbness/tingling/dizziness, + HA Psychiatric: no depression/anxiety  Past Medical History  Diagnosis Date  . Hyperlipidemia   . Hypertension   . Allergy   . Kidney stone   . H pylori ulcer   . Diabetes mellitus without complication    Past Surgical History  Procedure Laterality Date  . Nasal sinus surgery     History   Social History  . Marital Status: Married    Spouse Name: N/A    Number of Children: 4: 10, 7, 3, 1   Occupational History  . Validation engineer   Social History Main Topics  . Smoking status: Never Smoker   . Smokeless tobacco: Never Used  . Alcohol Use: Occasional, at parties  . Drug Use: No   Social History Narrative   Married.Education: College/Other. Exercise: Walks.   Current Outpatient Prescriptions on File Prior to Visit  Medication Sig Dispense Refill  . cyclobenzaprine (FLEXERIL) 10 MG tablet Take 1 tablet (10 mg total) by mouth at bedtime. For neck pain  30 tablet  0  . diclofenac (VOLTAREN) 75 MG EC tablet Take 1 tablet (75 mg total) by mouth 2 (two)  times daily.  60 tablet  0  . lisinopril (PRINIVIL,ZESTRIL) 10 MG tablet Take 1 tablet (10 mg total) by mouth daily.  90 tablet  3  . olopatadine (PATANOL) 0.1 % ophthalmic solution Place 1 drop into both eyes 2 (two) times daily.  5 mL  12  . omeprazole (PRILOSEC) 40 MG capsule Take 1 capsule (40 mg total) by mouth daily.  90 capsule  3  . TRUETRACK TEST test strip TEST ONCE DAILY AS INSTRUCTED.  50 each  2  . amoxicillin-clavulanate (AUGMENTIN) 875-125 MG per tablet Take 1 tablet by mouth 3 (three) times daily.  30 tablet  0  . azithromycin (ZITHROMAX) 250 MG tablet As packaged  6  tablet  0  . HYDROcodone-acetaminophen (NORCO) 5-325 MG per tablet Take 1 tablet by mouth every 6 (six) hours as needed for moderate pain.  10 tablet  0  . HYDROcodone-homatropine (HYCODAN) 5-1.5 MG/5ML syrup Take 5 mLs by mouth every 6 (six) hours as needed for cough.  120 mL  0  . oseltamivir (TAMIFLU) 75 MG capsule Take 1 capsule (75 mg total) by mouth 2 (two) times daily.  10 capsule  0  . PredniSONE 10 MG KIT 12 day taper dose pack. Use as directed  48 each  0   No current facility-administered medications on file prior to visit.   No Known Allergies Family History  Problem Relation Age of Onset  . Hypertension Mother, GM    Thyroid ds - mother  PE: BP 160/84  Pulse 78  Temp(Src) 98.4 F (36.9 C) (Oral)  Resp 12  Ht 6' 1.75" (1.873 m)  Wt 231 lb (104.781 kg)  BMI 29.87 kg/m2  SpO2 95% Wt Readings from Last 3 Encounters:  06/09/14 231 lb (104.781 kg)  02/01/14 233 lb (105.688 kg)  12/21/13 238 lb 6.4 oz (108.138 kg)   Constitutional: overweight, in NAD Eyes: PERRLA, EOMI, no exophthalmos ENT: moist mucous membranes, no thyromegaly, no cervical lymphadenopathy Cardiovascular: RRR, No MRG Respiratory: CTA B Gastrointestinal: abdomen soft, NT, ND, BS+ Musculoskeletal: no deformities, strength intact in all 4 Skin: moist, warm, no rashes Neurological: mild tremor with outstretched hands, DTR normal in all 4  ASSESSMENT: 1. DM2, non-insulin-dependent, uncontrolled, without complications  PLAN:  1. Patient with long-standing, recently more uncontrolled diabetes, on oral antidiabetic regimen, which became insufficient - We discussed about options for treatment, and I suggested to:  Patient Instructions  Please start Glumetza 500 mg with dinner x 3-4 days. If you tolerate this well, add another Metformin tablet (500 mg) with breakfast x 3-4 days. If you tolerate this well, add another metformin tablet with dinner (total 1000 mg) x 3-4 days. If you tolerate this well, add  another metformin tablets with breakfast (total 1000 mg). Continue with 1000 mg of metformin 2x a day with breakfast and dinner. Start Januvia 100 mg daily. Continue Glipizide 10 mg 2x a day. Please call me if sugars are consistently <80s or >200s. Please return in 1 month with your sugar log.  - Strongly advised him to start checking sugars at different times of the day - check 2 times a day, rotating checks - given sugar log and advised how to fill it and to bring it at next appt  - given foot care handout and explained the principles  - given instructions for hypoglycemia management "15-15 rule"  - advised for yearly eye exams >> he is up to date - refilled strips and lancets - Return to clinic in  1 mo with sugar log

## 2014-06-12 ENCOUNTER — Telehealth: Payer: Self-pay | Admitting: Internal Medicine

## 2014-06-12 NOTE — Telephone Encounter (Signed)
Can they dispense Metformin that is what they have in stock

## 2014-06-12 NOTE — Telephone Encounter (Signed)
Called and spoke with pharmacy. They asked if Dr Elvera LennoxGherghe meant to prescribe Metformin or would she like to switch to Metformin ER. Advised them that Dr Elvera LennoxGherghe wants to the pt on Glumetza. They understood and said that they would contact pt about cost and order med.

## 2014-07-10 ENCOUNTER — Other Ambulatory Visit (HOSPITAL_COMMUNITY): Payer: Self-pay | Admitting: Orthopedic Surgery

## 2014-07-10 DIAGNOSIS — M75101 Unspecified rotator cuff tear or rupture of right shoulder, not specified as traumatic: Secondary | ICD-10-CM

## 2014-07-12 ENCOUNTER — Encounter: Payer: Self-pay | Admitting: Internal Medicine

## 2014-07-12 ENCOUNTER — Ambulatory Visit (INDEPENDENT_AMBULATORY_CARE_PROVIDER_SITE_OTHER): Payer: 59 | Admitting: Internal Medicine

## 2014-07-12 VITALS — BP 130/90 | HR 72 | Temp 98.5°F | Resp 16 | Ht 74.0 in | Wt 234.0 lb

## 2014-07-12 DIAGNOSIS — E119 Type 2 diabetes mellitus without complications: Secondary | ICD-10-CM

## 2014-07-12 DIAGNOSIS — M545 Low back pain, unspecified: Secondary | ICD-10-CM

## 2014-07-12 DIAGNOSIS — G4710 Hypersomnia, unspecified: Secondary | ICD-10-CM

## 2014-07-12 DIAGNOSIS — I10 Essential (primary) hypertension: Secondary | ICD-10-CM

## 2014-07-12 DIAGNOSIS — Z6830 Body mass index (BMI) 30.0-30.9, adult: Secondary | ICD-10-CM

## 2014-07-12 MED ORDER — GLUMETZA 500 MG PO TB24
1000.0000 mg | ORAL_TABLET | Freq: Two times a day (BID) | ORAL | Status: DC
Start: 2014-07-12 — End: 2015-01-26

## 2014-07-12 NOTE — Progress Notes (Signed)
Subjective:    Patient ID: Jamie Foster, male    DOB: Jul 01, 1966, 48 y.o.   MRN: [id number] This chart was scribed for Tonye Pearsonobert P Doolittle, MD by Gwenevere AbbotAlexis Brown, ED scribe. This patient was seen in room Room/bed 28 and the patient's care was started at 4:04 PM.   HPI HPI Comments:  Jamie Doomnthony Ahlquist is a 48 y.o. male who [facility] a follow-up visit for DM and HTN. Pt also states that he would like a full physical which has been scheduled for mid September.  DM:Pt reports that he visited endocrinologist-Dr Elvera LennoxGherghe, and reports that JerseyGlumetza works well without gastrointestinal side effects that he suffered from other formulations of metformin. He has followed the protocol and had a Januvia and glipizide and noted a marked improvement in his early morning glucose. He has followup in the next week or 2.  HTN: Pt reports that lisinopril has worked well without side effects and outside blood pressures are normal.  Back: Pt reports that he feels very heavy bilaterally in the flank region as he wakes up every morning , and he is unable to bend down in the morning, however it does go away as the day progresses, usually within the first hour. Note that he has degenerative disc disease and cervical area which has caused problems in the past but is currently stable.  Headache: Pt reports that he occasionally experiences headache in the morning. Pt reports that he began taking an advil and vitamin at night, and reports that he felt significantly better in the morning when he did this. He also has difficulty with early morning hypersomnolence, nonrestorative sleep, on the mornings when he has to go to work. He has a past diagnosis of sleep apnea but reports no symptoms now of snoring, waking short of breath, or daytime hypersomnolence. He has occasional work schedule difficulties where he has to work until 12 or 1 in the morning but this is not a regular basis and is not part of this problem.  Arm  Weakness/ Shakes: Pt also reports that he experiences weakness in the arms when he raises them at the end of the day. Pt also experiences pain in the right rotator cuff region, and at times has been unable to lift his arm. Pt reports that he is now scheduled to see Dr. Eulah PontMurphy for further evaluation after [facility] made his problems so much worse.   Fatigue: Pt also reports that he feels tired during the week, when he awakes. Pt reports that he is a very light sleeper and wakes often due to his wife's snoring or other noise in the family unit. Pt reports that he occasionally works during the day from 8:30AM- 4:30PM, then returns back to work from 9:00PM- 1:00AM. He does this to accommodate his schedule of his children so he can be there for breakfast in the morning, and dinner at night with homework/ preparation for bed. No sleepiness with driving. No trouble staying awake at work. He has decreased his exercise due to the intensity of his work.     Review of Systems  Constitutional: Positive for fatigue.  Musculoskeletal: Positive for back pain.  Neurological: Positive for weakness (Left and right arm) and headaches.   no vision changes Wt Readings from Last 3 Encounters:  07/12/14 234 lb (106.142 kg)  06/09/14 231 lb (104.781 kg)  02/01/14 233 lb (105.688 kg)   he is trying to make dietary changes to improve his weight No  Chest pain or palpitations No dyspnea on exertion No GI or GU complaints     Objective:   Physical Exam  Nursing note and vitals reviewed. Constitutional: He is oriented to person, place, and time. He appears well-developed and well-nourished. No distress.  HENT:  Head: Normocephalic and atraumatic.  Eyes: Conjunctivae and EOM are normal. Pupils are equal, round, and reactive to light.  Neck: Normal range of motion. Neck supple. No thyromegaly present.  Cardiovascular: Normal rate, regular rhythm and normal heart sounds.   Pulmonary/Chest:  Effort normal.  Musculoskeletal: Normal range of motion.  Straight leg raise negative bilaterally to 90 No palpable tender areas in the lumbar spine Range of motion is good at this point  Neurological: He is alert and oriented to person, place, and time. He has normal reflexes. No cranial nerve deficit.  Upper extremities have no tremor on finger to nose, no sensory or motor losses.  Skin: Skin is warm and dry. No rash noted.  Psychiatric: He has a normal mood and affect. His behavior is normal.   Diabetes Foot Exam= by the nursing assistant-normal sensation           Assessment & Plan:   I have completed the patient encounter in its entirety as documented by the scribe, with editing by me where necessary. Robert P. Merla Riches, M.D.  Type II or unspecified type diabetes mellitus -all indications are that he has done well with the regimen prescribed by Dr. Elvera Lennox  His meds have been refilled prior to followup with her which should be soon  He is encouraged to continue weight loss  -Essential hypertension  Stable on lisinopril  Bilateral low back pain without sciatica  This seems muscular in origin and he is given a handout for exercise  Hypersomnolence  This is a complicated problem for him-his sleep cycle he is somewhat irregular/he has a diagnosis of sleep apnea but has  chosen not to accept CPAP-his current symptoms do not interfere with work or driving  No intervention is best at this point  BMI 30.0-30.9,adult  He would be much better off from metabolic standpoint if 30 pounds lighter  Meds ordered this encounter  Medications  . GLUMETZA 500 MG 24 hr tablet    Sig: Take 2 tablets (1,000 mg total) by mouth 2 (two) times daily with a meal.    Dispense:  120 tablet    Refill:  5   Followup as planned for CPE/health maintenance issues

## 2014-07-12 NOTE — Patient Instructions (Signed)
Please start Glumetza 500 mg with dinner x 3-4 days. If you tolerate this well, add another Metformin tablet (500 mg) with breakfast x 3-4 days. If you tolerate this well, add another metformin tablet with dinner (total 1000 mg) x 3-4 days. If you tolerate this well, add another metformin tablets with breakfast (total 1000 mg). Continue with 1000 mg of metformin 2x a day with breakfast and dinner.  Start Januvia 100 mg daily.  Continue Glipizide 10 mg 2x a day

## 2014-07-13 ENCOUNTER — Ambulatory Visit (HOSPITAL_COMMUNITY)
Admission: RE | Admit: 2014-07-13 | Discharge: 2014-07-13 | Disposition: A | Discharge status: Home / Self Care | Payer: 59 | Source: Ambulatory Visit | Attending: Orthopedic Surgery | Admitting: Orthopedic Surgery

## 2014-07-13 DIAGNOSIS — M67919 Unspecified disorder of synovium and tendon, unspecified shoulder: Secondary | ICD-10-CM | POA: Diagnosis present

## 2014-07-13 DIAGNOSIS — M19019 Primary osteoarthritis, unspecified shoulder: Secondary | ICD-10-CM | POA: Insufficient documentation

## 2014-07-13 DIAGNOSIS — M719 Bursopathy, unspecified: Secondary | ICD-10-CM | POA: Diagnosis present

## 2014-07-13 DIAGNOSIS — M75101 Unspecified rotator cuff tear or rupture of right shoulder, not specified as traumatic: Secondary | ICD-10-CM

## 2014-07-21 ENCOUNTER — Encounter: Payer: Self-pay | Admitting: Internal Medicine

## 2014-07-21 ENCOUNTER — Ambulatory Visit (INDEPENDENT_AMBULATORY_CARE_PROVIDER_SITE_OTHER): Payer: 59 | Admitting: Internal Medicine

## 2014-07-21 VITALS — BP 128/78 | HR 84 | Temp 97.9°F | Resp 12 | Wt 230.0 lb

## 2014-07-21 DIAGNOSIS — IMO0002 Reserved for concepts with insufficient information to code with codable children: Secondary | ICD-10-CM

## 2014-07-21 DIAGNOSIS — E1165 Type 2 diabetes mellitus with hyperglycemia: Principal | ICD-10-CM

## 2014-07-21 DIAGNOSIS — E1139 Type 2 diabetes mellitus with other diabetic ophthalmic complication: Secondary | ICD-10-CM

## 2014-07-21 DIAGNOSIS — E11319 Type 2 diabetes mellitus with unspecified diabetic retinopathy without macular edema: Secondary | ICD-10-CM

## 2014-07-21 LAB — HEMOGLOBIN A1C: Hgb A1c MFr Bld: 12.7 % — ABNORMAL HIGH (ref 4.6–6.5)

## 2014-07-21 NOTE — Patient Instructions (Signed)
Continue  - Glumetza 1000 mg 2x a day - Januvia 100 mg in am - Glipizide 10 mg bid  Please return in 1-1.5 month with your sugar log.   Please stop at the lab.

## 2014-07-21 NOTE — Progress Notes (Signed)
Patient ID: Edi Gorniak, male   DOB: 03-23-1966, 48 y.o.   MRN: [id number]  HPI: Finlay Godbee is a 48 y.o.-year-old male, returning for f/u for DM2, dx 2006, non-insulin-dependent, uncontrolled, without complications. Last visit 1.5 mo ago.  Last hemoglobin A1c was: Lab Results  Component Value Date   HGBA1C 11.2 12/19/2013   HGBA1C 9.7 10/12/2013   HGBA1C 8.5 06/22/2013  He had 1 steroid inj in his R shoulder this year and 1 last year. He also had a prednisone taper 12/2013.   Pt was on a regimen of: - Glipizide 10 mg bid He was JanuMet 50/1000 mg bid >> diarrhea and abdominal pain >> stopped 2 mo ago.  At last visit, we changed to: - Glumetza DAW 500 mg bid >> last week increased by Dr Merla Riches to 1000 mg bid as he did understand to increase it to this dose after last visit - Januvia 100 mg in am - Glipizide 10 mg bid  Pt checks his sugars once a day and they have improved: - am: 125-300s >> 168-211 - 2h after b'fast: n/c - before lunch: n/c - 2h after lunch: n/c - before dinner: n/c - 2h after dinner: n/c - bedtime: 200s-300s >> 147 (on 500 mg bid >> sugars 250-300) No lows. Lowest sugar was 168; has hypoglycemia awareness - ? level Highest sugar was 300  He has a Art therapist.  Pt's meals are: - Breakfast: oatmeal + 50-50% milk  - Lunch: steak + veggies; sometimes rice, veggies, chicken, tomato stew - Dinner: fufu gari-pounded yam: vegetable soup  - Snacks: no No sodas; alcohol only at parties  - Has mild CKD, last BUN/creatinine:  Lab Results  Component Value Date   BUN 13 12/19/2013   CREATININE 1.09 12/19/2013  On Lisinopril 10 - last set of lipids: Lab Results  Component Value Date   CHOL 184 12/19/2013   HDL 48 12/19/2013   LDLCALC 111* 12/19/2013   TRIG 126 12/19/2013   CHOLHDL 3.8 12/19/2013  Not on a statin. - last eye exam was in 2015 + Dr OU - no numbness and tingling in his feet.  I reviewed his chart and he also has a history of OSA, GERD,  HTN, HL, H pylori ulcer.  ROS: Constitutional: no weight gain/loss, + fatigue, no subjective hyperthermia/hypothermia, + poor sleep Eyes: no blurry vision, no xerophthalmia ENT: no sore throat, no nodules palpated in throat, no dysphagia/odynophagia, no hoarseness Cardiovascular: no CP/SOB/palpitations/leg swelling Respiratory: no cough/SOB Gastrointestinal: no N/V/D/C/heartburn Musculoskeletal: no muscle/joint aches Skin: no rashes Neurological: no tremors/numbness/tingling/dizziness  I reviewed pt's medications, allergies, PMH, social hx, family hx and no changes required, except as mentioned above.  PE: BP 128/78  Pulse 84  Temp(Src) 97.9 F (36.6 C) (Oral)  Resp 12  Wt 230 lb (104.327 kg)  SpO2 97% Wt Readings from Last 3 Encounters:  07/21/14 230 lb (104.327 kg)  07/12/14 234 lb (106.142 kg)  06/09/14 231 lb (104.781 kg)   Constitutional: overweight, in NAD Eyes: PERRLA, EOMI, no exophthalmos ENT: moist mucous membranes, no thyromegaly, no cervical lymphadenopathy Cardiovascular: RRR, No MRG Respiratory: CTA B Gastrointestinal: abdomen soft, NT, ND, BS+ Musculoskeletal: no deformities, strength intact in all 4 Skin: moist, warm, no rashes Neurological: mild tremor with outstretched hands, DTR normal in all 4  ASSESSMENT: 1. DM2, non-insulin-dependent, uncontrolled, without complications  PLAN:  1. Patient with long-standing, recently more uncontrolled diabetes, on oral antidiabetic regimen, which is insufficient, but he only recently increased Metformin to target dose  as he did not understand my instructions at last visit. - We discussed about options for treatment, and I suggested to:  Patient Instructions  Continue  - Glumetza 1000 mg 2x a day - Januvia 100 mg in am - Glipizide 10 mg bid Please return in 1-1.5 month with your sugar log.  Please stop at the lab. - Strongly advised him to start checking sugars at different times of the day - check 2-3 times a  day, rotating checks - he is up to date with yearly eye exams  - Return to clinic in 1 mo with sugar log   Office Visit on 07/21/2014  Component Date Value Ref Range Status  . Hemoglobin A1C 07/21/2014 12.7* 4.6 - 6.5 % Final   Glycemic Control Guidelines for People with Diabetes:Non Diabetic:  <6%Goal of Therapy: <7%Additional Action Suggested:  >8%    HbA1c very high. Most likely we need insulin at next visit.

## 2014-07-24 ENCOUNTER — Encounter: Payer: Self-pay | Admitting: *Deleted

## 2014-08-02 ENCOUNTER — Ambulatory Visit: Payer: 59 | Attending: Orthopedic Surgery | Admitting: Physical Therapy

## 2014-08-02 DIAGNOSIS — M542 Cervicalgia: Secondary | ICD-10-CM | POA: Insufficient documentation

## 2014-08-02 DIAGNOSIS — IMO0001 Reserved for inherently not codable concepts without codable children: Secondary | ICD-10-CM | POA: Diagnosis not present

## 2014-08-02 DIAGNOSIS — M25519 Pain in unspecified shoulder: Secondary | ICD-10-CM | POA: Insufficient documentation

## 2014-08-08 ENCOUNTER — Ambulatory Visit: Payer: 59 | Admitting: Physical Therapy

## 2014-08-08 DIAGNOSIS — IMO0001 Reserved for inherently not codable concepts without codable children: Secondary | ICD-10-CM | POA: Diagnosis not present

## 2014-08-15 ENCOUNTER — Ambulatory Visit: Payer: 59 | Admitting: Physical Therapy

## 2014-08-15 DIAGNOSIS — IMO0001 Reserved for inherently not codable concepts without codable children: Secondary | ICD-10-CM | POA: Diagnosis not present

## 2014-08-22 ENCOUNTER — Ambulatory Visit: Payer: 59 | Admitting: Rehabilitation

## 2014-08-22 DIAGNOSIS — IMO0001 Reserved for inherently not codable concepts without codable children: Secondary | ICD-10-CM | POA: Diagnosis not present

## 2014-08-29 ENCOUNTER — Ambulatory Visit: Payer: 59 | Attending: Orthopedic Surgery | Admitting: Physical Therapy

## 2014-08-29 DIAGNOSIS — M25511 Pain in right shoulder: Secondary | ICD-10-CM | POA: Diagnosis not present

## 2014-08-29 DIAGNOSIS — Z5189 Encounter for other specified aftercare: Secondary | ICD-10-CM | POA: Insufficient documentation

## 2014-08-29 DIAGNOSIS — M542 Cervicalgia: Secondary | ICD-10-CM | POA: Insufficient documentation

## 2014-08-30 ENCOUNTER — Ambulatory Visit (INDEPENDENT_AMBULATORY_CARE_PROVIDER_SITE_OTHER): Payer: 59 | Admitting: Internal Medicine

## 2014-08-30 ENCOUNTER — Encounter: Payer: Self-pay | Admitting: Internal Medicine

## 2014-08-30 VITALS — BP 130/90 | HR 81 | Temp 97.9°F | Resp 16 | Ht 73.75 in | Wt 234.6 lb

## 2014-08-30 DIAGNOSIS — E1165 Type 2 diabetes mellitus with hyperglycemia: Secondary | ICD-10-CM

## 2014-08-30 DIAGNOSIS — Z8042 Family history of malignant neoplasm of prostate: Secondary | ICD-10-CM

## 2014-08-30 DIAGNOSIS — K219 Gastro-esophageal reflux disease without esophagitis: Secondary | ICD-10-CM

## 2014-08-30 DIAGNOSIS — I10 Essential (primary) hypertension: Secondary | ICD-10-CM

## 2014-08-30 DIAGNOSIS — Z1211 Encounter for screening for malignant neoplasm of colon: Secondary | ICD-10-CM

## 2014-08-30 DIAGNOSIS — Z0000 Encounter for general adult medical examination without abnormal findings: Secondary | ICD-10-CM

## 2014-08-30 DIAGNOSIS — Z6830 Body mass index (BMI) 30.0-30.9, adult: Secondary | ICD-10-CM

## 2014-08-30 LAB — CBC WITH DIFFERENTIAL/PLATELET
Basophils Absolute: 0 10*3/uL (ref 0.0–0.1)
Basophils Relative: 0 % (ref 0–1)
Eosinophils Absolute: 0.1 10*3/uL (ref 0.0–0.7)
Eosinophils Relative: 2 % (ref 0–5)
HCT: 39.6 % (ref 39.0–52.0)
Hemoglobin: 13.4 g/dL (ref 13.0–17.0)
Lymphocytes Relative: 38 % (ref 12–46)
Lymphs Abs: 1.9 10*3/uL (ref 0.7–4.0)
MCH: 29.8 pg (ref 26.0–34.0)
MCHC: 33.8 g/dL (ref 30.0–36.0)
MCV: 88 fL (ref 78.0–100.0)
Monocytes Absolute: 0.4 10*3/uL (ref 0.1–1.0)
Monocytes Relative: 9 % (ref 3–12)
Neutro Abs: 2.5 10*3/uL (ref 1.7–7.7)
Neutrophils Relative %: 51 % (ref 43–77)
Platelets: 276 10*3/uL (ref 150–400)
RBC: 4.5 MIL/uL (ref 4.22–5.81)
RDW: 13.1 % (ref 11.5–15.5)
WBC: 4.9 10*3/uL (ref 4.0–10.5)

## 2014-08-30 LAB — POCT GLYCOSYLATED HEMOGLOBIN (HGB A1C): Hemoglobin A1C: 10.6

## 2014-08-30 LAB — IFOBT (OCCULT BLOOD): IFOBT: NEGATIVE

## 2014-08-30 MED ORDER — LISINOPRIL 10 MG PO TABS: 10.0000 mg | ORAL_TABLET | Freq: Every day | ORAL | Status: DC

## 2014-08-30 MED ORDER — NEOMYCIN-POLYMYXIN-HC 3.5-10000-1 OT SUSP: 3.0000 [drp] | Freq: Three times a day (TID) | OTIC | Status: DC

## 2014-08-30 MED ORDER — OMEPRAZOLE 40 MG PO CPDR: 40.0000 mg | DELAYED_RELEASE_CAPSULE | Freq: Every day | ORAL | Status: DC

## 2014-08-30 NOTE — Progress Notes (Signed)
Subjective:    Patient ID: Jamie Foster, male    DOB: 11/04/1966, 48 y.o.   MRN: [id number]  HPICPE-routine Patient Active Problem List   Diagnosis Date Noted  . DM (diabetes mellitus) 07/19/2012    Priority: High  . Family hx of prostate cancer 06/22/2013    Priority: Medium  . HTN (hypertension) 07/19/2012    Priority: Medium  . DDD (degenerative disc disease), cervical 07/19/2012    Priority: Medium  . Type 2 diabetes, uncontrolled, with retinopathy 06/09/2014  . Allergic conjunctivitis 06/22/2013  . Allergic rhinitis 06/22/2013  . OSA (obstructive sleep apnea) 10/06/2012  . BMI 30.0-30.9,adult 07/19/2012  . GERD (gastroesophageal reflux disease) 07/19/2012  . Sinusitis, chronic 07/19/2012    Since change in medication by Dr Cruzita Lederer, he feels like he has returned to "normal" weight and not painful to bend down and put on shoes. Glumetza not causing any GI distress. Has more energy.  PT for R shoulder Dr Percell Miller and concerned with the next step. Not hopeful that he will be back to 100% after PT complete. Would like recommendations for equipment for home exercise.Hand tingles when shoulder raised. Last MRI 07/2014.    Review of Systems  Constitutional: Negative for fever, activity change, fatigue and unexpected weight change.  HENT: Negative for trouble swallowing.   Eyes: Negative for photophobia and visual disturbance.  Respiratory: Negative for chest tightness and shortness of breath.   Cardiovascular: Negative for chest pain, palpitations and leg swelling.  Gastrointestinal: Negative for abdominal pain, diarrhea and constipation.  Genitourinary: Negative for frequency and difficulty urinating.  Musculoskeletal: Positive for arthralgias (R shoulder). Negative for gait problem, joint swelling and myalgias.  Skin: Negative for rash.  Neurological: Negative for tremors, weakness, numbness and headaches.  Hematological: Negative for adenopathy.  Psychiatric/Behavioral:  Negative for behavioral problems and sleep disturbance.       Objective:   Physical Exam  Constitutional: He is oriented to person, place, and time. He appears well-developed and well-nourished.  HENT:  Head: Normocephalic.  Right Ear: External ear normal.  Left Ear: External ear normal.  Nose: Nose normal.  Mouth/Throat: Oropharynx is clear and moist.  Tms and canals clear  Eyes: Conjunctivae and EOM are normal. Pupils are equal, round, and reactive to light.  Neck: Normal range of motion. Neck supple. No thyromegaly present.  Cardiovascular: Normal rate, regular rhythm, normal heart sounds and intact distal pulses.   No murmur heard. Pulmonary/Chest: Effort normal and breath sounds normal. No respiratory distress. He has no wheezes. He has no rales.  Abdominal: Soft. Bowel sounds are normal. He exhibits no distension and no mass. There is no tenderness. There is no rebound and no guarding.  No hepatosplenomegaly  Genitourinary:  Prostate smooth,soft,symmetrical w/out nodules  Musculoskeletal: Normal range of motion. He exhibits no edema and no tenderness.  Lymphadenopathy:    He has no cervical adenopathy.  Neurological: He is alert and oriented to person, place, and time. He has normal reflexes. No cranial nerve deficit. He exhibits normal muscle tone. Coordination normal.  Skin: Skin is warm and dry. No rash noted.  Psychiatric: He has a normal mood and affect. His behavior is normal. Judgment and thought content normal.  hemocult negative   A1C down to 10.6 in ~6weeks  Blood pressure 130/90, pulse 81, temperature 97.9 F (36.6 C), temperature source Oral, resp. rate 16, height 6' 1.75" (1.873 m), weight 234 lb 9.6 oz (106.414 kg), SpO2 97.00%.     Assessment & Plan:  PE (  physical exam), annual  Type 2 diabetes mellitus with hyperglycemia - Plan: POCT glycosylated hemoglobin (Hb A1C), CBC with Differential, Lipid panel, HM Diabetes Foot Exam  Family hx of prostate  cancer - Plan: PSA  Essential hypertension - Plan: Comprehensive metabolic panel, lisinopril (PRINIVIL,ZESTRIL) 10 MG tablet  BMI 30.0-30.9,adult  Screen for colon cancer - Plan: Ambulatory referral to Gastroenterology, IFOBT POC (occult bld, rslt in office)  Gastroesophageal reflux disease without esophagitis - Plan: omeprazole (PRILOSEC) 40 MG capsule  Meds ordered this encounter  Medications  . lisinopril (PRINIVIL,ZESTRIL) 10 MG tablet    Sig: Take 1 tablet (10 mg total) by mouth daily.    Dispense:  90 tablet    Refill:  3  . omeprazole (PRILOSEC) 40 MG capsule    Sig: Take 1 capsule (40 mg total) by mouth daily.    Dispense:  90 capsule    Refill:  3  . neomycin-polymyxin-hydrocortisone (CORTISPORIN) 3.5-10000-1 otic suspension    Sig: Place 3 drops into both ears 3 (three) times daily.    Dispense:  10 mL    Refill:  0   F/u 6 mos  Addend 10/8 Results for orders placed in visit on 08/30/14  CBC WITH DIFFERENTIAL      Result Value Ref Range   WBC 4.9  4.0 - 10.5 K/uL   RBC 4.50  4.22 - 5.81 MIL/uL   Hemoglobin 13.4  13.0 - 17.0 g/dL   HCT 39.6  39.0 - 52.0 %   MCV 88.0  78.0 - 100.0 fL   MCH 29.8  26.0 - 34.0 pg   MCHC 33.8  30.0 - 36.0 g/dL   RDW 13.1  11.5 - 15.5 %   Platelets 276  150 - 400 K/uL   Neutrophils Relative % 51  43 - 77 %   Neutro Abs 2.5  1.7 - 7.7 K/uL   Lymphocytes Relative 38  12 - 46 %   Lymphs Abs 1.9  0.7 - 4.0 K/uL   Monocytes Relative 9  3 - 12 %   Monocytes Absolute 0.4  0.1 - 1.0 K/uL   Eosinophils Relative 2  0 - 5 %   Eosinophils Absolute 0.1  0.0 - 0.7 K/uL   Basophils Relative 0  0 - 1 %   Basophils Absolute 0.0  0.0 - 0.1 K/uL   Smear Review Criteria for review not met    COMPREHENSIVE METABOLIC PANEL      Result Value Ref Range   Sodium 136  135 - 145 mEq/L   Potassium 4.3  3.5 - 5.3 mEq/L   Chloride 99  96 - 112 mEq/L   CO2 29  19 - 32 mEq/L   Glucose, Bld 244 (*) 70 - 99 mg/dL   BUN 12  6 - 23 mg/dL   Creat 1.02  0.50  - 1.35 mg/dL   Total Bilirubin 0.7  0.2 - 1.2 mg/dL   Alkaline Phosphatase 46  39 - 117 U/L   AST 16  0 - 37 U/L   ALT 22  0 - 53 U/L   Total Protein 7.2  6.0 - 8.3 g/dL   Albumin 4.4  3.5 - 5.2 g/dL   Calcium 9.7  8.4 - 10.5 mg/dL  LIPID PANEL      Result Value Ref Range   Cholesterol 189  0 - 200 mg/dL   Triglycerides 78  <150 mg/dL   HDL 55  >39 mg/dL   Total CHOL/HDL Ratio 3.4  VLDL 16  0 - 40 mg/dL   LDL Cholesterol 118 (*) 0 - 99 mg/dL  PSA      Result Value Ref Range   PSA 0.72  <=4.00 ng/mL  POCT GLYCOSYLATED HEMOGLOBIN (HGB A1C)      Result Value Ref Range   Hemoglobin A1C 10.6    IFOBT (OCCULT BLOOD)      Result Value Ref Range   IFOBT Negative

## 2014-08-31 LAB — COMPREHENSIVE METABOLIC PANEL
ALT: 22 U/L (ref 0–53)
AST: 16 U/L (ref 0–37)
Albumin: 4.4 g/dL (ref 3.5–5.2)
Alkaline Phosphatase: 46 U/L (ref 39–117)
BUN: 12 mg/dL (ref 6–23)
CO2: 29 mEq/L (ref 19–32)
Calcium: 9.7 mg/dL (ref 8.4–10.5)
Chloride: 99 mEq/L (ref 96–112)
Creat: 1.02 mg/dL (ref 0.50–1.35)
Glucose, Bld: 244 mg/dL — ABNORMAL HIGH (ref 70–99)
Potassium: 4.3 mEq/L (ref 3.5–5.3)
Sodium: 136 mEq/L (ref 135–145)
Total Bilirubin: 0.7 mg/dL (ref 0.2–1.2)
Total Protein: 7.2 g/dL (ref 6.0–8.3)

## 2014-08-31 LAB — LIPID PANEL
Cholesterol: 189 mg/dL (ref 0–200)
HDL: 55 mg/dL (ref >39)
LDL Cholesterol: 118 mg/dL — ABNORMAL HIGH (ref 0–99)
Total CHOL/HDL Ratio: 3.4 Ratio
Triglycerides: 78 mg/dL (ref <150)
VLDL: 16 mg/dL (ref 0–40)

## 2014-08-31 LAB — PSA: PSA: 0.72 ng/mL (ref <=4.00)

## 2014-09-01 ENCOUNTER — Encounter: Payer: Self-pay | Admitting: Internal Medicine

## 2014-09-01 ENCOUNTER — Ambulatory Visit: Payer: 59 | Admitting: Endocrinology

## 2014-09-01 ENCOUNTER — Ambulatory Visit (INDEPENDENT_AMBULATORY_CARE_PROVIDER_SITE_OTHER): Payer: 59 | Admitting: Internal Medicine

## 2014-09-01 VITALS — BP 152/84 | HR 78 | Temp 97.7°F | Resp 12 | Wt 240.0 lb

## 2014-09-01 DIAGNOSIS — E11319 Type 2 diabetes mellitus with unspecified diabetic retinopathy without macular edema: Secondary | ICD-10-CM

## 2014-09-01 DIAGNOSIS — IMO0002 Reserved for concepts with insufficient information to code with codable children: Secondary | ICD-10-CM

## 2014-09-01 DIAGNOSIS — E1165 Type 2 diabetes mellitus with hyperglycemia: Secondary | ICD-10-CM

## 2014-09-01 NOTE — Progress Notes (Signed)
Patient ID: Jamie Foster Guidone, male   DOB: 1966/05/29, 48 y.o.   MRN: [id number]  HPI: Jamie Foster Will is a 48 y.o.-year-old male, returning for f/u for DM2, dx 2006, non-insulin-dependent, uncontrolled, without complications. Last visit 1.5 mo ago.  Last hemoglobin A1c was: Lab Results  Component Value Date   HGBA1C 10.6 08/30/2014   HGBA1C 12.7* 07/21/2014   HGBA1C 11.2 12/19/2013  He had 1 steroid inj in his R shoulder this year and 1 last year. He also had a prednisone taper 12/2013.   Pt was on a regimen of: - Glipizide 10 mg bid He was JanuMet 50/1000 mg bid >> diarrhea and abdominal pain >> stopped 2 mo ago.  At last visit, we changed to: - Glumetza DAW 500 mg bid >> 1000 mg bid  - Januvia 100 mg in am - Glipizide 10 mg bid  Pt checks his sugars once a day and they are still high - still no reads in the middle of the day: - am: 125-300s >> 168-211 >> 180-230 - 2h after b'fast: n/c - before lunch: n/c - 2h after lunch: n/c - before dinner: n/c - 2h after dinner: n/c - bedtime: 200s-300s >> 147 (on 500 mg bid >> sugars 250-300) >> 230-240 No lows. Lowest sugar was 139; has hypoglycemia awareness - ? level Highest sugar was 300s after finished eating.  He has a Art therapistTrueTrack glucometer.  Pt's meals are: - Breakfast: oatmeal + 50-50% milk  - Lunch: steak + veggies; sometimes rice, veggies, chicken, tomato stew - Dinner: fufu gari-pounded yam: vegetable soup  - Snacks: no No sodas; alcohol only at parties  - Has mild CKD, last BUN/creatinine:  Lab Results  Component Value Date   BUN 12 08/30/2014   CREATININE 1.02 08/30/2014  On Lisinopril 10 - last set of lipids: Lab Results  Component Value Date   CHOL 189 08/30/2014   HDL 55 08/30/2014   LDLCALC 118* 08/30/2014   TRIG 78 08/30/2014   CHOLHDL 3.4 08/30/2014  Not on a statin. - last eye exam was in 2015 + Dr OU - no numbness and tingling in his feet.  I reviewed his chart and he also has a history of OSA, GERD, HTN, HL, H  pylori ulcer.  ROS: Constitutional: + weight gain, no fatigue, no subjective hyperthermia/hypothermia Eyes: no blurry vision, no xerophthalmia ENT: no sore throat, no nodules palpated in throat, no dysphagia/odynophagia, no hoarseness Cardiovascular: no CP/SOB/palpitations/leg swelling Respiratory: no cough/SOB Gastrointestinal: no N/V/D/C/heartburn Musculoskeletal: no muscle/joint aches Skin: no rashes Neurological: no tremors/numbness/tingling/dizziness  I reviewed pt's medications, allergies, PMH, social hx, family hx and no changes required, except as mentioned above.  PE: BP 152/84  Pulse 78  Temp(Src) 97.7 F (36.5 C) (Oral)  Resp 12  Wt 240 lb (108.863 kg)  SpO2 95% Wt Readings from Last 3 Encounters:  09/01/14 240 lb (108.863 kg)  08/30/14 234 lb 9.6 oz (106.414 kg)  07/21/14 230 lb (104.327 kg)   Constitutional: overweight, in NAD Eyes: PERRLA, EOMI, no exophthalmos ENT: moist mucous membranes, no thyromegaly, no cervical lymphadenopathy Cardiovascular: RRR, No MRG Respiratory: CTA B Gastrointestinal: abdomen soft, NT, ND, BS+ Musculoskeletal: no deformities, strength intact in all 4 Skin: moist, warm, no rashes Neurological: mild tremor with outstretched hands, DTR normal in all 4  ASSESSMENT: 1. DM2, non-insulin-dependent, uncontrolled, without complications  PLAN:  1. Patient with long-standing, uncontrolled diabetes, on oral antidiabetic regimen, which is insufficient, despite a good oral regimen. I believe he needs basal insulin, but he  refuses. I suggested Invokana at least to try for 1 mo, but he refuses and would like to continue Metformin for another month and try to lose weight during this time. - I suggested to:  Patient Instructions  Please continue: - Glumetza 1000 mg 2x a day with meals - Januvia 100 mg in am  - Glipizide 10 mg 2x a day before meals  Please return in 1 month with your sugar log.  - Strongly advised him to start checking  sugars at different times of the day - check 2-3 times a day, rotating checks - he is up to date with yearly eye exams  - Return to clinic in 1 mo with sugar log

## 2014-09-01 NOTE — Patient Instructions (Signed)
Please continue: - Glumetza 1000 mg 2x a day with meals - Januvia 100 mg in am  - Glipizide 10 mg 2x a day before meals  Please return in 1 month with your sugar log.

## 2014-09-05 ENCOUNTER — Ambulatory Visit: Payer: 59 | Admitting: Rehabilitation

## 2014-09-05 DIAGNOSIS — Z5189 Encounter for other specified aftercare: Secondary | ICD-10-CM | POA: Diagnosis not present

## 2014-09-12 ENCOUNTER — Ambulatory Visit: Payer: 59 | Admitting: Physical Therapy

## 2014-09-18 ENCOUNTER — Encounter: Payer: Self-pay | Admitting: Internal Medicine

## 2014-09-19 ENCOUNTER — Other Ambulatory Visit: Payer: Self-pay | Admitting: Internal Medicine

## 2014-09-20 ENCOUNTER — Other Ambulatory Visit: Payer: Self-pay | Admitting: *Deleted

## 2014-09-20 MED ORDER — GLIPIZIDE 10 MG PO TABS: 10.0000 mg | ORAL_TABLET | Freq: Two times a day (BID) | ORAL | Status: DC

## 2014-10-02 ENCOUNTER — Encounter: Payer: Self-pay | Admitting: Internal Medicine

## 2014-10-02 ENCOUNTER — Ambulatory Visit (INDEPENDENT_AMBULATORY_CARE_PROVIDER_SITE_OTHER): Payer: 59 | Admitting: Internal Medicine

## 2014-10-02 VITALS — BP 128/88 | HR 81 | Resp 14 | Wt 241.2 lb

## 2014-10-02 DIAGNOSIS — IMO0002 Reserved for concepts with insufficient information to code with codable children: Secondary | ICD-10-CM

## 2014-10-02 DIAGNOSIS — E1165 Type 2 diabetes mellitus with hyperglycemia: Secondary | ICD-10-CM

## 2014-10-02 DIAGNOSIS — E11319 Type 2 diabetes mellitus with unspecified diabetic retinopathy without macular edema: Secondary | ICD-10-CM

## 2014-10-02 NOTE — Patient Instructions (Signed)
Please continue: - Glumetza 1000 mg 2x a day with meals - Januvia 100 mg in am  - Glipizide 10 mg 2x a day before meals  Please return in 2 months with your sugar log.

## 2014-10-02 NOTE — Progress Notes (Signed)
Patient ID: Jamie Foster, male   DOB: [birth year], 48 y.o.   MRN: [id number]  HPI: Jamie Foster is a 48 y.o.-year-old male, returning for f/u for DM2, dx 2006, non-insulin-dependent, uncontrolled, without complications. Last visit 1.5 mo ago.  He was found to have a ear inf recently >> started drops >> feels much better and his sugars are better, too.  Last hemoglobin A1c was: Lab Results  Component Value Date   HGBA1C 10.6 08/30/2014   HGBA1C 12.7* 07/21/2014   HGBA1C 11.2 12/19/2013  He had 1 steroid inj in his R shoulder this year and 1 last year. He also had a prednisone taper 12/2013.   Pt was on a regimen of: - Glipizide 10 mg bid He was JanuMet 50/1000 mg bid >> diarrhea and abdominal pain, mm weakness >> stopped 2 mo ago.  At last visit, we changed to: - Glumetza DAW 500 mg bid >> 1000 mg bid  - Januvia 100 mg in am - Glipizide 10 mg bid  Pt checks his sugars once a day and they are much better - no log and meter: - am: 125-300s >> 168-211 >> 180-230 >> 125-140, 180x1 - 2h after b'fast: n/c - before lunch: n/c - 2h after lunch: n/c - before dinner: n/c - 2h after dinner: n/c - bedtime: 200s-300s >> 147 (on 500 mg bid >> sugars 250-300) >> 230-240 >> 150s No lows. Lowest sugar was 125; highest 225; has hypoglycemia awareness - ? level  He has a Art therapist[employer]Track glucometer.  Pt's meals are: - Breakfast: oatmeal + 50-50% milk  - Lunch: steak + veggies; sometimes rice, veggies, chicken, tomato stew - Dinner: fufu gari-pounded yam: vegetable soup  - Snacks: no No sodas; alcohol only at parties  - Has mild CKD, last BUN/creatinine:  Lab Results  Component Value Date   BUN 12 08/30/2014   CREATININE 1.02 08/30/2014  On Lisinopril 10 - last set of lipids: Lab Results  Component Value Date   CHOL 189 08/30/2014   HDL 55 08/30/2014   LDLCALC 118* 08/30/2014   TRIG 78 08/30/2014   CHOLHDL 3.4 08/30/2014  Not on a statin. - last eye exam was in 2015 + Dr OU - no  numbness and tingling in his feet.  I reviewed his chart and he also has a history of OSA, GERD, HTN, HL, H pylori ulcer.  ROS: Constitutional: no weight gain, no fatigue, no subjective hyperthermia/hypothermia Eyes: no blurry vision, no xerophthalmia ENT: no sore throat, no nodules palpated in throat, no dysphagia/odynophagia, no hoarseness, + L ear pain Cardiovascular: no CP/SOB/palpitations/leg swelling Respiratory: no cough/SOB Gastrointestinal: no N/V/D/C/heartburn Musculoskeletal: no muscle/joint aches Skin: no rashes Neurological: no tremors/numbness/tingling/dizziness  I reviewed pt's medications, allergies, PMH, social hx, family hx and no changes required, except as mentioned above.  PE: BP 128/88 mmHg  Pulse 81  Resp 14  Wt 241 lb 3.2 oz (109.408 kg)  SpO2 98% Body mass index is 31.19 kg/(m^2).  Wt Readings from Last 3 Encounters:  10/02/14 241 lb 3.2 oz (109.408 kg)  09/01/14 240 lb (108.863 kg)  08/30/14 234 lb 9.6 oz (106.414 kg)   Constitutional: overweight, in NAD Eyes: PERRLA, EOMI, no exophthalmos ENT: moist mucous membranes, no thyromegaly, no cervical lymphadenopathy, L tympanic mb erythematous Cardiovascular: RRR, No MRG Respiratory: CTA B Gastrointestinal: abdomen soft, NT, ND, BS+ Musculoskeletal: no deformities, strength intact in all 4 Skin: moist, warm, no rashes Neurological: mild tremor with outstretched hands, DTR normal in all 4  ASSESSMENT: 1. DM2,  non-insulin-dependent, uncontrolled, without complications  PLAN:  1. Patient with long-standing, uncontrolled diabetes, on oral antidiabetic regimen, with much improved ctrl after treating his ear infection. - I suggested to:  Patient Instructions  Please continue: - Glumetza 1000 mg 2x a day with meals - Januvia 100 mg in am  - Glipizide 10 mg 2x a day before meals  Please return in 1 month with your sugar log.  - Strongly advised him to start checking sugars at different times of the  day - check 2-3 times a day, rotating checks - he is up to date with yearly eye exams  - Return to clinic in 2 mo with sugar log

## 2014-10-03 ENCOUNTER — Ambulatory Visit: Payer: 59 | Attending: Orthopedic Surgery | Admitting: Physical Therapy

## 2014-10-03 ENCOUNTER — Encounter: Payer: Self-pay | Admitting: Physician Assistant

## 2014-10-03 DIAGNOSIS — M542 Cervicalgia: Secondary | ICD-10-CM | POA: Insufficient documentation

## 2014-10-03 DIAGNOSIS — Z5189 Encounter for other specified aftercare: Secondary | ICD-10-CM | POA: Insufficient documentation

## 2014-10-03 DIAGNOSIS — M25511 Pain in right shoulder: Secondary | ICD-10-CM | POA: Diagnosis not present

## 2014-10-16 ENCOUNTER — Other Ambulatory Visit: Payer: Self-pay | Admitting: Internal Medicine

## 2014-10-17 ENCOUNTER — Telehealth: Payer: Self-pay

## 2014-10-17 NOTE — Telephone Encounter (Signed)
Dr. Merla Richesoolittle   Patient has questions regarding the drops prescribed.  Patient goes by "chief".   [phone]

## 2014-10-18 MED ORDER — NEOMYCIN-POLYMYXIN-HC 3.5-10000-1 OT SUSP: 3.0000 [drp] | Freq: Three times a day (TID) | OTIC | Status: DC

## 2014-10-18 NOTE — Telephone Encounter (Signed)
Meds ordered this encounter  Medications  . neomycin-polymyxin-hydrocortisone (CORTISPORIN) 3.5-10000-1 otic suspension    Sig: Place 3 drops into both ears 3 (three) times daily.    Dispense:  10 mL    Refill:  0

## 2014-10-18 NOTE — Telephone Encounter (Signed)
Patient states he wants to know if he can get a refill on drops for his ears. He is improving, but not well [phone]

## 2014-10-19 NOTE — Telephone Encounter (Signed)
Called patient to advise the drops were sent in. I have advised patient.

## 2014-11-29 LAB — HM DIABETES EYE EXAM

## 2014-12-04 ENCOUNTER — Ambulatory Visit: Payer: 59 | Admitting: Internal Medicine

## 2014-12-07 ENCOUNTER — Ambulatory Visit (INDEPENDENT_AMBULATORY_CARE_PROVIDER_SITE_OTHER): Payer: 59 | Admitting: Internal Medicine

## 2014-12-07 ENCOUNTER — Encounter: Payer: Self-pay | Admitting: Internal Medicine

## 2014-12-07 VITALS — BP 128/90 | HR 91 | Temp 97.8°F | Resp 12 | Wt 236.0 lb

## 2014-12-07 DIAGNOSIS — E1165 Type 2 diabetes mellitus with hyperglycemia: Secondary | ICD-10-CM

## 2014-12-07 DIAGNOSIS — E11319 Type 2 diabetes mellitus with unspecified diabetic retinopathy without macular edema: Secondary | ICD-10-CM

## 2014-12-07 DIAGNOSIS — IMO0002 Reserved for concepts with insufficient information to code with codable children: Secondary | ICD-10-CM

## 2014-12-07 NOTE — Patient Instructions (Signed)
Patient Instructions  Please continue: - Glumetza 1000 mg 2x a day with meals - Januvia 100 mg in am  - Glipizide 10 mg 2x a day before meals  Please return in 3 months with your sugar log.  Please let me know if the sugars are consistently <80 or >200.  Please stop at the lab.

## 2014-12-07 NOTE — Progress Notes (Signed)
Patient ID: Jamie Foster, male   DOB: 03-14-1966, 49 y.o.   MRN: [id number]  HPI: Jamie Foster is a 49 y.o.-year-old male, returning for f/u for DM2, dx 2006, non-insulin-dependent, uncontrolled, without complications. Last visit 2 mo ago.  Last hemoglobin A1c was: Lab Results  Component Value Date   HGBA1C 10.6 08/30/2014   HGBA1C 12.7* 07/21/2014   HGBA1C 11.2 12/19/2013  He had 1 steroid inj in his R shoulder this year and 1 last year. He also had a prednisone taper 12/2013.   Pt was on a regimen of: - Glipizide 10 mg bid He was JanuMet 50/1000 mg bid >> diarrhea and abdominal pain, mm weakness >> stopped 2 mo ago.  At last visit, we changed to: - Glumetza DAW 500 mg bid >> 1000 mg bid  - Januvia 100 mg in am - Glipizide 10 mg bid  Pt checks his sugars once a day mostly in am - no log and meter: - am: 125-300s >> 168-211 >> 180-230 >> 125-140, 180x1 >> 128-143 - 2h after b'fast: n/c - before lunch: n/c - 2h after lunch: n/c >> 150-200 - before dinner: n/c - 2h after dinner: n/c - bedtime: 200s-300s >> 147 (on 500 mg bid >> sugars 250-300) >> 230-240 >> 150s >> n/c No lows. Lowest sugar was 128; highest 225; has hypoglycemia awareness - ? level  He has a Art therapist.  Pt's meals are: - Breakfast: oatmeal + 50-50% milk  - Lunch: steak + veggies; sometimes rice, veggies, chicken, tomato stew - Dinner: fufu gari-pounded yam: vegetable soup  - Snacks: no No sodas; alcohol only at parties  - Has mild CKD, last BUN/creatinine:  Lab Results  Component Value Date   BUN 12 08/30/2014   CREATININE 1.02 08/30/2014  On Lisinopril 10 - last set of lipids: Lab Results  Component Value Date   CHOL 189 08/30/2014   HDL 55 08/30/2014   LDLCALC 118* 08/30/2014   TRIG 78 08/30/2014   CHOLHDL 3.4 08/30/2014  Not on a statin. - last eye exam was in 11/29/2014. + DR OU. - no numbness and tingling in his feet.  I reviewed his chart and he also has a history of OSA,  GERD, HTN, HL, H pylori ulcer.  ROS: Constitutional: no weight gain, no fatigue, no subjective hyperthermia/hypothermia Eyes: no blurry vision, no xerophthalmia ENT: no sore throat, no nodules palpated in throat, no dysphagia/odynophagia, no hoarseness Cardiovascular: no CP/SOB/palpitations/leg swelling Respiratory: no cough/SOB Gastrointestinal: no N/V/D/C/heartburn Musculoskeletal: no muscle/joint aches Skin: no rashes Neurological: no tremors/numbness/tingling/dizziness  I reviewed pt's medications, allergies, PMH, social hx, family hx and no changes required, except as mentioned above.  PE: BP 128/90 mmHg  Pulse 91  Temp(Src) 97.8 F (36.6 C) (Oral)  Resp 12  Wt 236 lb (107.049 kg)  SpO2 96% Body mass index is 30.51 kg/(m^2).  Wt Readings from Last 3 Encounters:  12/07/14 236 lb (107.049 kg)  10/02/14 241 lb 3.2 oz (109.408 kg)  09/01/14 240 lb (108.863 kg)   Constitutional: overweight, in NAD Eyes: PERRLA, EOMI, no exophthalmos ENT: moist mucous membranes, no thyromegaly, no cervical lymphadenopathy, L tympanic mb erythematous Cardiovascular: RRR, No MRG Respiratory: CTA B Gastrointestinal: abdomen soft, NT, ND, BS+ Musculoskeletal: no deformities, strength intact in all 4 Skin: moist, warm, no rashes Neurological: mild tremor with outstretched hands, DTR normal in all 4  ASSESSMENT: 1. DM2, non-insulin-dependent, uncontrolled, without complications  PLAN:  1. Patient with long-standing, uncontrolled diabetes, on oral antidiabetic regimen, with much improved  ctrl after treating his ear infection. - I suggested to:  Patient Instructions  Please continue: - Glumetza 1000 mg 2x a day with meals - Januvia 100 mg in am  - Glipizide 10 mg 2x a day before meals  Please return in 1 month with your sugar log. - we discussed about Invokana and I gave him a discount card and a brochure about it >> we may need to add this in the future.  - again advised him to start  checking sugars at different times of the day - check 2-3 times a day, rotating checks and bring me the log or meter - he is up to date with yearly eye exams  - check hba1c today - Return to clinic in 3 mo with sugar log   Office Visit on 12/07/2014  Component Date Value Ref Range Status  . HM Diabetic Eye Exam 11/29/2014 Retinopathy* No Retinopathy Final  . Hgb A1c MFr Bld 12/07/2014 10.5* 4.6 - 6.5 % Final   Glycemic Control Guidelines for People with Diabetes:Non Diabetic:  <6%Goal of Therapy: <7%Additional Action Suggested:  >8%    HbA1c still very high >> start Invokana 100 mg >> come back in 1 mo for reevaluation and BMP

## 2014-12-08 LAB — HEMOGLOBIN A1C: Hgb A1c MFr Bld: 10.5 % — ABNORMAL HIGH (ref 4.6–6.5)

## 2014-12-08 MED ORDER — CANAGLIFLOZIN 100 MG PO TABS: 100.0000 mg | ORAL_TABLET | Freq: Every day | ORAL | Status: DC

## 2014-12-18 ENCOUNTER — Other Ambulatory Visit: Payer: Self-pay | Admitting: Internal Medicine

## 2015-01-24 ENCOUNTER — Other Ambulatory Visit: Payer: Self-pay | Admitting: Internal Medicine

## 2015-01-24 ENCOUNTER — Other Ambulatory Visit: Payer: Self-pay | Admitting: *Deleted

## 2015-01-24 MED ORDER — GLUCOSE BLOOD VI STRP: ORAL_STRIP | Status: DC

## 2015-01-24 MED ORDER — ONETOUCH ULTRA 2 W/DEVICE KIT: PACK | Status: DC

## 2015-01-24 MED ORDER — ONETOUCH DELICA LANCETS 33G MISC: Status: DC

## 2015-01-24 NOTE — Telephone Encounter (Signed)
Ins will not cover the True Track test strips. Changing meters and supplies to One Touch Ultra.

## 2015-01-26 ENCOUNTER — Other Ambulatory Visit: Payer: Self-pay | Admitting: *Deleted

## 2015-01-26 MED ORDER — GLUMETZA 500 MG PO TB24: 1000.0000 mg | ORAL_TABLET | Freq: Two times a day (BID) | ORAL | Status: DC

## 2015-03-08 ENCOUNTER — Ambulatory Visit (INDEPENDENT_AMBULATORY_CARE_PROVIDER_SITE_OTHER): Payer: 59 | Admitting: Internal Medicine

## 2015-03-08 ENCOUNTER — Encounter: Payer: Self-pay | Admitting: Internal Medicine

## 2015-03-08 VITALS — BP 138/88 | HR 96 | Temp 98.2°F | Resp 12 | Wt 243.0 lb

## 2015-03-08 DIAGNOSIS — IMO0002 Reserved for concepts with insufficient information to code with codable children: Secondary | ICD-10-CM

## 2015-03-08 DIAGNOSIS — E11319 Type 2 diabetes mellitus with unspecified diabetic retinopathy without macular edema: Secondary | ICD-10-CM

## 2015-03-08 DIAGNOSIS — E1165 Type 2 diabetes mellitus with hyperglycemia: Secondary | ICD-10-CM

## 2015-03-08 NOTE — Progress Notes (Signed)
Patient ID: Jamie Berlinnthony O Groner, male   DOB: [birth year], 49 y.o.   MRN: [id number]  HPI: Jamie Berlinnthony O Crampton is a [age] y.o.-year-old male, returning for f/u for DM2, dx 2006, non-insulin-dependent, uncontrolled, without complications. Last visit 3 mo ago.  Last hemoglobin A1c was: Lab Results  Component Value Date   HGBA1C 10.5* 12/07/2014   HGBA1C 10.6 08/30/2014   HGBA1C 12.7* 07/21/2014  He had 1 steroid inj in his R shoulder this year and 1 last year. He also had a prednisone taper 12/2013.   Pt was on a regimen of: - Glipizide 10 mg bid He was JanuMet 50/1000 mg bid >> diarrhea and abdominal pain, mm weakness >> stopped 2 mo ago.  At last visit, we changed to: - Glumetza DAW 500 mg bid >> 1000 mg bid  - Januvia 100 mg in am - Glipizide 10 mg bid He did not start Invokana 100 mg in am in 11/2014 as suggested.  Pt checks his sugars once a day mostly in am - no log and meter again: - am: 125-300s >> 168-211 >> 180-230 >> 125-140, 180x1 >> 128-143 >> 110-200s, 289 - 2h after b'fast: n/c >> 180s - before lunch: n/c  - 2h after lunch: n/c >> 150-200 >> n/c Soccer 4x a week - before dinner: n/c >> 180-220 - 2h after dinner: n/c >> 177-180 - bedtime: 200s-300s >> 147 (on 500 mg bid >> sugars 250-300) >> 230-240 >> 150s >> n/c >> 248 No lows. Lowest sugar was 110; highest 248; has hypoglycemia awareness - ? level  He has a [employer] glucometer.  Pt's meals are: - Breakfast: oatmeal + 50-50% milk  - Lunch: steak + veggies; sometimes rice, veggies, chicken, tomato stew - Dinner: fufu gari-pounded yam: vegetable soup  - Snacks: no No sodas; alcohol only at parties  - Has mild CKD, last BUN/creatinine:  Lab Results  Component Value Date   BUN 12 08/30/2014   CREATININE 1.02 08/30/2014  On Lisinopril 10 - last set of lipids: Lab Results  Component Value Date   CHOL 189 08/30/2014   HDL 55 08/30/2014   LDLCALC 118* 08/30/2014   TRIG 78 08/30/2014   CHOLHDL 3.4 08/30/2014  Not on a  statin. - last eye exam was in 11/29/2014. + DR OU. - no numbness and tingling in his feet.  I reviewed his chart and he also has a history of OSA, GERD, HTN, HL, H pylori ulcer.  ROS: Constitutional: no weight gain, no fatigue, no subjective hyperthermia/hypothermia Eyes: no blurry vision, no xerophthalmia ENT: no sore throat, no nodules palpated in throat, no dysphagia/odynophagia, no hoarseness Cardiovascular: no CP/SOB/palpitations/leg swelling Respiratory: no cough/SOB Gastrointestinal: no N/V/D/C/heartburn Musculoskeletal: no muscle/joint aches Skin: no rashes Neurological: no tremors/numbness/tingling/dizziness  I reviewed pt's medications, allergies, PMH, social hx, family hx and no changes required, except as mentioned above.  PE: BP 138/88 mmHg  Pulse 96  Temp(Src) 98.2 F (36.8 C) (Oral)  Resp 12  Wt 243 lb (110.224 kg)  SpO2 98% Body mass index is 31.42 kg/(m^2).  Wt Readings from Last 3 Encounters:  03/08/15 243 lb (110.224 kg)  12/07/14 236 lb (107.049 kg)  10/02/14 241 lb 3.2 oz (109.408 kg)   Constitutional: overweight, in NAD Eyes: PERRLA, EOMI, no exophthalmos ENT: moist mucous membranes, no thyromegaly, no cervical lymphadenopathy Cardiovascular: RRR, No MRG Respiratory: CTA B Gastrointestinal: abdomen soft, NT, ND, BS+ Musculoskeletal: no deformities, strength intact in all 4 Skin: moist, warm, no rashes Neurological: mild tremor with outstretched  hands, DTR normal in all 4  ASSESSMENT: 1. DM2, non-insulin-dependent, uncontrolled, without complications  PLAN:  1. Patient with long-standing, uncontrolled diabetes, on oral antidiabetic regimen, with still high sugars. He is not compliant with sugar checks and medications recommended. I had a long discussion today explaining that he is glucotoxic and it would be very difficult, if not impossible to decrease his sugars without taking the intended medicines. I explained that we need to attack diabetes  from different directions, adding Invokan or insulin until sugars decrease and then we can start de-intensifying tx if possible. He understood and agreed to start Invokana. - I suggested to:  Patient Instructions  Please continue: - Glumetza 1000 mg 2x a day with meals - Januvia 100 mg in am  - Glipizide 10 mg 2x a day before meals  Please start Invokana 100 mg daily in am  Please return in 1 month with your sugar log. - we discussed about SEs of Invokana, which are: dizziness (advised to be careful when stands from sitting position), decreased BP - usually not < normal (BP today is not low), and fungal UTIs (advised to let me know if develops one).  - again advised him to start checking sugars at different times of the day - check 2-3 times a day, rotating checks and bring me the log or meter - he is up to date with yearly eye exams  - check hba1c today - Return to clinic in 1 mo with sugar log - check BMP then  Office Visit on 03/08/2015  Component Date Value Ref Range Status  . Hgb A1c MFr Bld 03/08/2015 9.0* 4.6 - 6.5 % Final   Glycemic Control Guidelines for People with Diabetes:Non Diabetic:  <6%Goal of Therapy: <7%Additional Action Suggested:  >8%   Hemoglobin A1c lower, but still high.

## 2015-03-08 NOTE — Patient Instructions (Signed)
Please continue: - Glumetza 1000 mg 2x a day with meals - Januvia 100 mg in am  - Glipizide 10 mg 2x a day before meals  Please start Invokana 100 mg daily in am  Please return in 1 month with your sugar log.

## 2015-03-09 LAB — HEMOGLOBIN A1C: Hgb A1c MFr Bld: 9 % — ABNORMAL HIGH (ref 4.6–6.5)

## 2015-03-20 ENCOUNTER — Other Ambulatory Visit: Payer: Self-pay | Admitting: Internal Medicine

## 2015-04-10 ENCOUNTER — Ambulatory Visit: Payer: 59 | Admitting: Internal Medicine

## 2015-05-01 ENCOUNTER — Other Ambulatory Visit: Payer: Self-pay | Admitting: Internal Medicine

## 2015-05-03 ENCOUNTER — Other Ambulatory Visit: Payer: Self-pay | Admitting: *Deleted

## 2015-05-03 ENCOUNTER — Encounter: Payer: Self-pay | Admitting: Internal Medicine

## 2015-05-03 ENCOUNTER — Ambulatory Visit (INDEPENDENT_AMBULATORY_CARE_PROVIDER_SITE_OTHER): Payer: 59 | Admitting: Internal Medicine

## 2015-05-03 VITALS — BP 128/88 | HR 80 | Temp 97.9°F | Resp 14 | Wt 236.0 lb

## 2015-05-03 DIAGNOSIS — E1165 Type 2 diabetes mellitus with hyperglycemia: Secondary | ICD-10-CM

## 2015-05-03 DIAGNOSIS — IMO0002 Reserved for concepts with insufficient information to code with codable children: Secondary | ICD-10-CM

## 2015-05-03 DIAGNOSIS — E11319 Type 2 diabetes mellitus with unspecified diabetic retinopathy without macular edema: Secondary | ICD-10-CM

## 2015-05-03 MED ORDER — GLIPIZIDE 10 MG PO TABS: ORAL_TABLET | ORAL | Status: DC

## 2015-05-03 MED ORDER — SITAGLIPTIN PHOSPHATE 100 MG PO TABS: 100.0000 mg | ORAL_TABLET | Freq: Every day | ORAL | Status: DC

## 2015-05-03 MED ORDER — GLUMETZA 500 MG PO TB24: 1000.0000 mg | ORAL_TABLET | Freq: Two times a day (BID) | ORAL | Status: DC

## 2015-05-03 NOTE — Progress Notes (Signed)
Patient ID: Jamie Foster, male   DOB: 05-01-1966, 49 y.o.   MRN: [id number]  HPI: Jamie Foster is a 49 y.o.-year-old male, returning for f/u for DM2, dx 2006, non-insulin-dependent, uncontrolled, without complications. Last visit 2 mo ago.  Last hemoglobin A1c was: Lab Results  Component Value Date   HGBA1C 9.0* 03/08/2015   HGBA1C 10.5* 12/07/2014   HGBA1C 10.6 08/30/2014  He had 1 steroid inj in his R shoulder this year and 1 last year. He also had a prednisone taper 12/2013.   Pt was on a regimen of: - Glumetza DAW 500 mg bid >> 1000 mg bid  - Januvia 100 mg in am - Glipizide 10 mg bid He tried Invokana 100 mg in am in 03/2015 >> increased urination and urgency >> had to stop. He was JanuMet 50/1000 mg bid >> diarrhea and abdominal pain, mm weakness >> stopped 2 mo ago.  Pt checks his sugars once a day mostly in am - no log - brings meter (but new one - few checks) - am: 125-300s >> 168-211 >> 180-230 >> 125-140, 180x1 >> 128-143 >> 110-200s, 289 >> 133-185, 203 - 2h after b'fast: n/c >> 180s - before lunch: n/c  - 2h after lunch: n/c >> 150-200 >> n/c Soccer 4x a week - before dinner: n/c >> 180-220 >> 1x 298 - 2h after dinner: n/c >> 177-180 >> n/c >> 1x 368 - bedtime: 200s-300s >> 147 (on 500 mg bid >> sugars 250-300) >> 230-240 >> 150s >> n/c >> 248 >> n/c No lows. Lowest sugar was 110; highest 248; has hypoglycemia awareness - ? level  He has a Art therapist.  Pt's meals are: - Breakfast: oatmeal + 50-50% milk  - Lunch: steak + veggies; sometimes rice, veggies, chicken, tomato stew - Dinner: fufu gari-pounded yam: vegetable soup  - Snacks: no No sodas; alcohol only at parties  - Has mild CKD, last BUN/creatinine:  Lab Results  Component Value Date   BUN 12 08/30/2014   CREATININE 1.02 08/30/2014  On Lisinopril 10 - last set of lipids: Lab Results  Component Value Date   CHOL 189 08/30/2014   HDL 55 08/30/2014   LDLCALC 118* 08/30/2014   TRIG 78  08/30/2014   CHOLHDL 3.4 08/30/2014  Not on a statin. - last eye exam was in 11/29/2014. + DR OU. - no numbness and tingling in his feet.  I reviewed his chart and he also has a history of OSA, GERD, HTN, HL, H pylori ulcer.  ROS: Constitutional: no weight gain, no fatigue, no subjective hyperthermia/hypothermia Eyes: no blurry vision, no xerophthalmia ENT: no sore throat, no nodules palpated in throat, no dysphagia/odynophagia, no hoarseness Cardiovascular: no CP/SOB/palpitations/leg swelling Respiratory: no cough/SOB Gastrointestinal: no N/V/D/C/heartburn Musculoskeletal: no muscle/joint aches Skin: no rashes Neurological: no tremors/numbness/tingling/dizziness  I reviewed pt's medications, allergies, PMH, social hx, family hx, and changes were documented in the history of present illness. Otherwise, unchanged from my initial visit note.  PE: BP 128/88 mmHg  Pulse 80  Temp(Src) 97.9 F (36.6 C) (Oral)  Resp 14  SpO2 97% There is no weight on file to calculate BMI.  Wt Readings from Last 3 Encounters:  03/08/15 243 lb (110.224 kg)  12/07/14 236 lb (107.049 kg)  10/02/14 241 lb 3.2 oz (109.408 kg)   Constitutional: overweight, in NAD Eyes: PERRLA, EOMI, no exophthalmos ENT: moist mucous membranes, no thyromegaly, no cervical lymphadenopathy Cardiovascular: RRR, No MRG Respiratory: CTA B Gastrointestinal: abdomen soft, NT, ND, BS+  Musculoskeletal: no deformities, strength intact in all 4 Skin: moist, warm, no rashes Neurological: mild tremor with outstretched hands, DTR normal in all 4  ASSESSMENT: 1. DM2, non-insulin-dependent, uncontrolled, without complications  PLAN:  1. Patient with long-standing, uncontrolled diabetes, on oral antidiabetic regimen, with still high sugars. He is not compliant with sugar checks. He refuses insulin and finally accepted to try Invokana at last visit, but he could not take it as he had severe urinary frequency and urgency. He would  like to continue to work on his diet and to relax his schedule, but is reticent to try new meds... - Will then stay on the same regimen:  Patient Instructions  Please continue: - Glumetza 1000 mg 2x a day with meals - Januvia 100 mg in am  - Glipizide 10 mg 2x a day before meals  Please return in 2 month with your sugar log.  - again advised him to start checking sugars at different times of the day - check 2-3 times a day, rotating checks and bring me the log or meter - he is up to date with yearly eye exams  - check hba1c at next visit - Return to clinic in 2 mo with sugar log - check A1c then

## 2015-05-03 NOTE — Patient Instructions (Signed)
Please continue: - Glumetza 1000 mg 2x a day with meals - Januvia 100 mg in am  - Glipizide 10 mg 2x a day before meals  Please return in 2 month with your sugar log.

## 2015-06-12 ENCOUNTER — Ambulatory Visit (INDEPENDENT_AMBULATORY_CARE_PROVIDER_SITE_OTHER): Payer: 59 | Admitting: Internal Medicine

## 2015-06-12 VITALS — BP 132/88 | HR 82 | Temp 97.6°F | Resp 18 | Ht 74.75 in | Wt 237.0 lb

## 2015-06-12 DIAGNOSIS — H9202 Otalgia, left ear: Secondary | ICD-10-CM | POA: Diagnosis not present

## 2015-06-12 DIAGNOSIS — K047 Periapical abscess without sinus: Secondary | ICD-10-CM

## 2015-06-12 MED ORDER — NEOMYCIN-POLYMYXIN-HC 3.5-10000-1 OT SUSP: 3.0000 [drp] | Freq: Three times a day (TID) | OTIC | Status: DC

## 2015-06-12 MED ORDER — MELOXICAM 15 MG PO TABS: 15.0000 mg | ORAL_TABLET | Freq: Every day | ORAL | Status: DC

## 2015-06-12 MED ORDER — AMOXICILLIN-POT CLAVULANATE 875-125 MG PO TABS: 1.0000 | ORAL_TABLET | Freq: Two times a day (BID) | ORAL | Status: DC

## 2015-06-12 NOTE — Progress Notes (Addendum)
Subjective:  This chart was scribed for Ellamae Sia, MD by Stann Ore, Medical Scribe. This patient was seen in Room 9 and the patient's care was started at 9:06 AM.     Patient ID: Jamie Foster, male    DOB: 01/20/1966, 49 y.o.   MRN: [id number]  HPI Jamie Foster is a 49 y.o. male who presents to [facility] complaining of tooth pain and ear pain with associated headaches that started during his flight from [place]. He says he also has some trouble hearing and popping sounds in his ear. He says there is pain in both sides, but more painful on left. He notes having some congestion as well. He had taken 4 Advil to calm it down before next flight. He was in [place] for a family reunion.   While he was in [place], he was eating a piece of beef for dinner and felt a tooth on his bottom right side shift. Now with lots of pain there  He notes that his DM doing better, however, he feels that his medication is making his bones brittle. He says he feels weakness in his right calf. Thinks was due to janumet--now changed  He states that he had breakfast 3 days ago and had plenty of water. In the morning, when he checked his blood sugar, it was 147. But later in the day, before he checked ate dinner, he checked his blood sugar to be 258. After he ate, he rechecked for 180.  His last company has planned to close their [employer] branch. He's been out of his job since March.    Patient Active Problem List   Diagnosis Date Noted  . Type 2 diabetes, uncontrolled, with retinopathy 06/09/2014  . Family hx of prostate cancer 06/22/2013  . Allergic conjunctivitis 06/22/2013  . Allergic rhinitis 06/22/2013  . OSA (obstructive sleep apnea) 10/06/2012  . BMI 30.0-30.9,adult 07/19/2012  . HTN (hypertension) 07/19/2012  . GERD (gastroesophageal reflux disease) 07/19/2012  . DDD (degenerative disc disease), cervical 07/19/2012  . Sinusitis, chronic 07/19/2012    Current outpatient prescriptions:  .  Blood  Glucose Monitoring Suppl (ONE TOUCH ULTRA 2) W/DEVICE KIT, Use to test blood sugar daily as instructed., Disp: 1 each, Rfl: 0 .  cyclobenzaprine (FLEXERIL) 10 MG tablet, Take 1 tablet (10 mg total) by mouth at bedtime. For neck pain, Disp: 30 tablet, Rfl: 0 .  diclofenac (VOLTAREN) 75 MG EC tablet, Take 1 tablet (75 mg total) by mouth 2 (two) times daily., Disp: 60 tablet, Rfl: 0 .  glipiZIDE (GLUCOTROL) 10 MG tablet, TAKE 1 TABLET (10 MG TOTAL) BY MOUTH 2 (TWO) TIMES DAILY BEFORE A MEAL., Disp: 180 tablet, Rfl: 0 .  glucose blood (ONE TOUCH ULTRA TEST) test strip, Use to test blood sugar 2 times daily as instructed., Disp: 100 each, Rfl: 11 .  glucose blood (TRUETRACK TEST) test strip, Use 2x a day, Disp: 100 each, Rfl: 5 .  GLUMETZA 500 MG 24 hr tablet, Take 2 tablets (1,000 mg total) by mouth 2 (two) times daily with a meal., Disp: 120 tablet, Rfl: 2 .  HYDROcodone-acetaminophen (NORCO) 5-325 MG per tablet, Take 1 tablet by mouth every 6 (six) hours as needed for moderate pain., Disp: 10 tablet, Rfl: 0 .  lisinopril (PRINIVIL,ZESTRIL) 10 MG tablet, Take 1 tablet (10 mg total) by mouth daily., Disp: 90 tablet, Rfl: 3 .  meloxicam (MOBIC) 7.5 MG tablet, Take 7.5 mg by mouth as needed. , Disp: , Rfl: 2 .  neomycin-polymyxin-hydrocortisone (CORTISPORIN) 3.5-10000-1 otic suspension, Place 3 drops into both ears 3 (three) times daily., Disp: 10 mL, Rfl: 0 .  olopatadine (PATANOL) 0.1 % ophthalmic solution, Place 1 drop into both eyes 2 (two) times daily., Disp: 5 mL, Rfl: 12 .  omeprazole (PRILOSEC) 40 MG capsule, Take 1 capsule (40 mg total) by mouth daily., Disp: 90 capsule, Rfl: 3 .  ONETOUCH DELICA LANCETS 68T MISC, Use to test blood sugar 2 times daily as instructed., Disp: 100 each, Rfl: 11 .  sitaGLIPtin (JANUVIA) 100 MG tablet, Take 1 tablet (100 mg total) by mouth daily., Disp: 60 tablet, Rfl: 2 .  [place] LANCETS MISC, Use 2x a day - Tru Track, Disp: 200 each, Rfl: 5    Review of Systems    Constitutional: Negative for fever, chills, diaphoresis and fatigue.  HENT: Positive for congestion, dental problem, ear pain and hearing loss.   Gastrointestinal: Negative for nausea, vomiting, diarrhea and constipation.  Skin: Negative for rash and wound.  Neurological: Positive for headaches.       Objective:   Physical Exam  Constitutional: He is oriented to person, place, and time. He appears well-developed and well-nourished. No distress.  HENT:  Head: Normocephalic and atraumatic.  Left auditory canal is irritated mildly Both TMs are clear Nares are slightly boggy without discharge Throat is shallow but clear No nodes The right posterior molar has tenderness at the Root with redness//is capped  Eyes: EOM are normal. Pupils are equal, round, and reactive to light.  Neck: Neck supple.  Cardiovascular: Normal rate.   Pulmonary/Chest: Effort normal. No respiratory distress.  Musculoskeletal: Normal range of motion.  Neurological: He is alert and oriented to person, place, and time.  Skin: Skin is warm and dry.  Psychiatric: He has a normal mood and affect. His behavior is normal.  Nursing note and vitals reviewed.   BP 132/88 mmHg  Pulse 82  Temp(Src) 97.6 F (36.4 C) (Oral)  Resp 18  Ht 6' 2.75" (1.899 m)  Wt 237 lb (107.502 kg)  BMI 29.81 kg/m2  SpO2 99%       Assessment & Plan:  Dental abscess  Otalgia, left due to ext otit Meds ordered this encounter  Medications  . amoxicillin-clavulanate (AUGMENTIN) 875-125 MG per tablet    Sig: Take 1 tablet by mouth 2 (two) times daily.    Dispense:  20 tablet    Refill:  0  . meloxicam (MOBIC) 15 MG tablet    Sig: Take 1 tablet (15 mg total) by mouth daily.    Dispense:  30 tablet    Refill:  0  . neomycin-polymyxin-hydrocortisone (CORTISPORIN) 3.5-10000-1 otic suspension    Sig: Place 3 drops into both ears 3 (three) times daily.    Dispense:  10 mL    Refill:  0   F/u dentist I have completed the patient  encounter in its entirety as documented by the scribe, with editing by me where necessary. Jalyric Kaestner P. Laney Pastor, M.D.

## 2015-07-04 ENCOUNTER — Ambulatory Visit: Payer: 59 | Admitting: Internal Medicine

## 2015-08-09 ENCOUNTER — Other Ambulatory Visit: Payer: Self-pay | Admitting: Internal Medicine

## 2015-08-23 ENCOUNTER — Telehealth: Payer: Self-pay | Admitting: Internal Medicine

## 2015-08-23 NOTE — Telephone Encounter (Signed)
Pt states that he does need a call back there is a substitute med we could call in but I unfortunately could not understand what he was saying and he got very upset with me and stated he wanted to speak with [facility].

## 2015-08-23 NOTE — Telephone Encounter (Signed)
Called pt and advised him that we have co-pay cards for [facility]. Will leave one at the front desk for him. Pt voided understanding.

## 2015-08-23 NOTE — Telephone Encounter (Signed)
Pt needs to see if we can give a coupon or help him with his glimetza rx

## 2015-08-23 NOTE — Telephone Encounter (Signed)
We can try Glucophage ER (DAW) at the same dose >> start slow and advance up. I really would need to see him and check a HbA1c for further changes.

## 2015-08-23 NOTE — Telephone Encounter (Signed)
Pt stated he cannot afford the Glumetza. It cost him $75. The co-pay cards we had are out of date. Pt cannot afford co-pay to come in for his follow-up. Please advise.

## 2015-08-24 ENCOUNTER — Other Ambulatory Visit: Payer: Self-pay | Admitting: *Deleted

## 2015-08-24 MED ORDER — GLUCOPHAGE XR 500 MG PO TB24: 1000.0000 mg | ORAL_TABLET | Freq: Two times a day (BID) | ORAL | Status: DC

## 2015-08-24 NOTE — Telephone Encounter (Signed)
Done

## 2015-08-24 NOTE — Addendum Note (Signed)
Addended by: Adline Mango B on: 08/24/2015 03:55 PM   Modules accepted: Orders, Medications

## 2015-08-24 NOTE — Telephone Encounter (Signed)
Called pt and advised him per Dr Charlean Sanfilippo message below. Pt ok to change. Please advise directions and I will send in. Thank you.

## 2015-08-24 NOTE — Telephone Encounter (Signed)
Please look at my note, send the same dose of Glucophage ex are d.a.w.

## 2015-09-25 ENCOUNTER — Other Ambulatory Visit: Payer: Self-pay | Admitting: Internal Medicine

## 2015-10-09 ENCOUNTER — Telehealth: Payer: Self-pay | Admitting: Internal Medicine

## 2015-10-09 MED ORDER — GLIPIZIDE 10 MG PO TABS: ORAL_TABLET | ORAL | Status: DC

## 2015-10-09 NOTE — Telephone Encounter (Signed)
Pt needs refill on glipizide 10mg  called to [facility] please

## 2015-10-09 NOTE — Telephone Encounter (Signed)
Refill sent to pt's pharmacy. 

## 2015-10-22 ENCOUNTER — Encounter: Payer: Self-pay | Admitting: Internal Medicine

## 2015-12-12 ENCOUNTER — Other Ambulatory Visit: Payer: Self-pay | Admitting: Internal Medicine

## 2015-12-12 MED FILL — ONE TOUCH ULTRA TEST STRIPS: 50 days supply | Qty: 100 | Fill #1

## 2015-12-12 MED FILL — glipiZIDE 10 MG TABS: 10 | 90 days supply | Qty: 180 | Fill #0

## 2015-12-12 MED FILL — METFORMIN HCL ER 500 MG TAB: 500 | 30 days supply | Qty: 120 | Fill #0

## 2015-12-21 ENCOUNTER — Telehealth: Payer: Self-pay | Admitting: Internal Medicine

## 2015-12-21 ENCOUNTER — Ambulatory Visit (INDEPENDENT_AMBULATORY_CARE_PROVIDER_SITE_OTHER): Payer: 59 | Admitting: Internal Medicine

## 2015-12-21 ENCOUNTER — Encounter: Payer: Self-pay | Admitting: Internal Medicine

## 2015-12-21 VITALS — BP 118/70 | HR 83 | Temp 98.2°F | Resp 12 | Wt 235.0 lb

## 2015-12-21 DIAGNOSIS — E1165 Type 2 diabetes mellitus with hyperglycemia: Secondary | ICD-10-CM

## 2015-12-21 DIAGNOSIS — E11319 Type 2 diabetes mellitus with unspecified diabetic retinopathy without macular edema: Secondary | ICD-10-CM | POA: Diagnosis not present

## 2015-12-21 DIAGNOSIS — IMO0002 Reserved for concepts with insufficient information to code with codable children: Secondary | ICD-10-CM | POA: Insufficient documentation

## 2015-12-21 LAB — COMPLETE METABOLIC PANEL WITH GFR
ALT: 24 U/L (ref 9–46)
AST: 17 U/L (ref 10–35)
Albumin: 4.1 g/dL (ref 3.6–5.1)
Alkaline Phosphatase: 47 U/L (ref 40–115)
BUN: 11 mg/dL (ref 7–25)
CO2: 29 mmol/L (ref 20–31)
Calcium: 9.4 mg/dL (ref 8.6–10.3)
Chloride: 101 mmol/L (ref 98–110)
Creat: 1.16 mg/dL (ref 0.70–1.33)
GFR, Est African American: 84 mL/min (ref >=60)
GFR, Est Non African American: 73 mL/min (ref >=60)
Glucose, Bld: 185 mg/dL — ABNORMAL HIGH (ref 65–99)
Potassium: 4.4 mmol/L (ref 3.5–5.3)
Sodium: 139 mmol/L (ref 135–146)
Total Bilirubin: 0.6 mg/dL (ref 0.2–1.2)
Total Protein: 6.7 g/dL (ref 6.1–8.1)

## 2015-12-21 LAB — LIPID PANEL
Cholesterol: 202 mg/dL — ABNORMAL HIGH (ref 0–200)
HDL: 50.8 mg/dL (ref >39.00)
LDL Cholesterol: 134 mg/dL — ABNORMAL HIGH (ref 0–99)
NonHDL: 150.7
Total CHOL/HDL Ratio: 4
Triglycerides: 85 mg/dL (ref 0.0–149.0)
VLDL: 17 mg/dL (ref 0.0–40.0)

## 2015-12-21 LAB — HEMOGLOBIN A1C: Hgb A1c MFr Bld: 11.1 % — ABNORMAL HIGH (ref 4.6–6.5)

## 2015-12-21 NOTE — Progress Notes (Signed)
Patient ID: Jamie Foster, male   DOB: 11/21/1966, 50 y.o.   MRN: [id number]  HPI: Jamie Foster is a 50 y.o.-year-old male, returning for f/u for DM2, dx 2006, non-insulin-dependent, uncontrolled, with complications (DR).   Last visit 7 mo ago!  Last hemoglobin A1c was: Lab Results  Component Value Date   HGBA1C 9.0* 03/08/2015   HGBA1C 10.5* 12/07/2014   HGBA1C 10.6 08/30/2014  He had 1 steroid inj in his R shoulder this year and 1 last year. He also had a prednisone taper 12/2013.   Pt was on a regimen of: - Glumetza DAW 500 mg bid >> 1000 mg bid  >> now Metformin ER 1000 mg bid - Januvia 100 mg in am - Glipizide 10 mg bid He tried Invokana 100 mg in am in 03/2015 >> increased urination and urgency >> had to stop. He was JanuMet 50/1000 mg bid >> diarrhea and abdominal pain, mm weakness >> stopped 2 mo ago.  Pt checks his sugars - no log, no meter:  - am: 168-211 >> 180-230 >> 125-140, 180x1 >> 128-143 >> 110-200s, 289 >> 133-185, 203 >> 140-210 - 2h after b'fast: n/c >> 180s >> n/c - before lunch: n/c  - 2h after lunch: n/c >> 150-200 >> n/c Soccer 4x a week - before dinner: n/c >> 180-220 >> 1x 298 >> n/c >> 200-224  - 2h after dinner: n/c >> 177-180 >> n/c >> 1x 368 >> n/c - bedtime:147 (on 500 mg bid >> sugars 250-300) >> 230-240 >> 150s >> n/c >> 248 >> n/c >> 180-240 No lows. Lowest sugar was 110 >> 140; highest 248 >> 240; has hypoglycemia awareness - ? level  He has a Art therapist.  Pt's meals are: - Breakfast: oatmeal + 50-50% milk  - Lunch: steak + veggies; sometimes rice, veggies, chicken, tomato stew - Dinner: fufu gari-pounded yam: vegetable soup  - Snacks: no No sodas; alcohol only at parties  - Has mild CKD, last BUN/creatinine:  Lab Results  Component Value Date   BUN 12 08/30/2014   CREATININE 1.02 08/30/2014  On Lisinopril 10 - last set of lipids: Lab Results  Component Value Date   CHOL 189 08/30/2014   HDL 55 08/30/2014   LDLCALC  118* 08/30/2014   TRIG 78 08/30/2014   CHOLHDL 3.4 08/30/2014  Not on a statin. - last eye exam was in 11/29/2014. + DR OU. - no numbness and tingling in his feet.  He also has a history of OSA, GERD, HTN, HL, H pylori ulcer.  ROS: Constitutional: no weight gain, no fatigue, no subjective hyperthermia/hypothermia Eyes: no blurry vision, no xerophthalmia ENT: no sore throat, no nodules palpated in throat, no dysphagia/odynophagia, no hoarseness Cardiovascular: no CP/SOB/palpitations/leg swelling Respiratory: no cough/SOB Gastrointestinal: no N/V/D/C/heartburn Musculoskeletal: no muscle/joint aches Skin: no rashes Neurological: no tremors/numbness/tingling/dizziness  I reviewed pt's medications, allergies, PMH, social hx, family hx, and changes were documented in the history of present illness. Otherwise, unchanged from my initial visit note.  PE: BP 118/70 mmHg  Pulse 83  Temp(Src) 98.2 F (36.8 C) (Oral)  Resp 12  Wt 235 lb (106.595 kg)  SpO2 98% Body mass index is 29.56 kg/(m^2).  Wt Readings from Last 3 Encounters:  12/21/15 235 lb (106.595 kg)  06/12/15 237 lb (107.502 kg)  05/03/15 236 lb (107.049 kg)   Constitutional: overweight, in NAD Eyes: PERRLA, EOMI, no exophthalmos ENT: moist mucous membranes, no thyromegaly, no cervical lymphadenopathy Cardiovascular: RRR, No MRG Respiratory: CTA B  Gastrointestinal: abdomen soft, NT, ND, BS+ Musculoskeletal: no deformities, strength intact in all 4 Skin: moist, warm, no rashes Neurological: no tremor with outstretched hands, DTR normal in all 4  ASSESSMENT: 1. DM2, non-insulin-dependent, uncontrolled, without complications  PLAN:  1. Patient with long-standing, uncontrolled diabetes, on oral antidiabetic regimen, with persisting high sugars. He comes after a long absence (7 months) without the sugar log and without his meter. His sugars are still very high. He was reluctant in accepting any treatment for his diabetes in  the past, but now he is on 3 oral medicines. He mentions that he is taking them, but I I am not sure if he is completely compliant with his doses. He has repeatedly refused insulin in the past, and unfortunately, I do not see other solution at this point except maybe a GLP-1 receptor agonist in place of Januvia. He again refuses lab any other medicine at this point and would like to continue to work on his diet and exercise. I advised him that he has been working on this for at least a year without consistent results. We discussed at length about the risks of uncontrolled diabetes, the best target for hemoglobin A1c, and the reason to target an A1c lower than 7%. He would like me to give him another 3 months and accepts to think about insulin if his sugars are not controlled the next visit. - Will then stay on the same regimen:  Patient Instructions  Please continue: - Metformin ER 1000 mg 2x day - Januvia 100 mg in am - Glipizide 10 mg 2x a day  PLEASE consider insulin 1x a day.   Please come back for a follow-up appointment in 3 months.  - again advised him to start checking sugars at different times of the day - check 2 times a day, rotating checks and bring me the log or meter - he needs a new eye exam >> advised to schedule  - check hba1c today, along with CMP and Lipids - Return to clinic in 3 mo with sugar log   Office Visit on 12/21/2015  Component Date Value Ref Range Status  . Cholesterol 12/21/2015 202* 0 - 200 mg/dL Final   ATP III Classification       Desirable:  < 200 mg/dL               Borderline High:  200 - 239 mg/dL          High:  > = 409 mg/dL  . Triglycerides 12/21/2015 85.0  0.0 - 149.0 mg/dL Final   Normal:  <811 mg/dLBorderline High:  150 - 199 mg/dL  . HDL 12/21/2015 50.80  >39.00 mg/dL Final  . VLDL 91/47/8295 17.0  0.0 - 40.0 mg/dL Final  . LDL Cholesterol 12/21/2015 134* 0 - 99 mg/dL Final  . Total CHOL/HDL Ratio 12/21/2015 4   Final                  Men           Women1/2 Average Risk     3.4          3.3Average Risk          5.0          4.42X Average Risk          9.6          7.13X Average Risk          15.0  11.0                      . NonHDL 12/21/2015 150.70   Final   NOTE:  Non-HDL goal should be 30 mg/dL higher than patient's LDL goal (i.e. LDL goal of < 70 mg/dL, would have non-HDL goal of < 100 mg/dL)  . Hgb A1c MFr Bld 12/21/2015 11.1* 4.6 - 6.5 % Final   Glycemic Control Guidelines for People with Diabetes:Non Diabetic:  <6%Goal of Therapy: <7%Additional Action Suggested:  >8%    Abysmal HbA1c! I doubt that he is taking his medicines as prescribed. If he does not start doing this, I may not be able to help him further.

## 2015-12-21 NOTE — Telephone Encounter (Signed)
Returned call and answered pt's question.

## 2015-12-21 NOTE — Telephone Encounter (Signed)
Patient ask you to call him °

## 2015-12-21 NOTE — Patient Instructions (Addendum)
Please continue: - Metformin ER 1000 mg 2x day - Januvia 100 mg in am - Glipizide 10 mg 2x a day  PLEASE consider insulin 1x a day.   Please come back for a follow-up appointment in 3 months.

## 2015-12-24 ENCOUNTER — Telehealth: Payer: Self-pay | Admitting: Internal Medicine

## 2015-12-24 ENCOUNTER — Other Ambulatory Visit: Payer: Self-pay | Admitting: Internal Medicine

## 2015-12-24 MED FILL — JANUVIA 100 MG TABLET: 100 | 60 days supply | Qty: 60 | Fill #0

## 2015-12-24 NOTE — Telephone Encounter (Signed)
Patient need refills of everythings on his med list send to  [facility] - [place] - [address] [phone] (Phone) [phone] (Fax)

## 2015-12-24 NOTE — Telephone Encounter (Signed)
Refill sent to pt's pharmacy. 

## 2015-12-25 ENCOUNTER — Other Ambulatory Visit: Payer: Self-pay | Admitting: Internal Medicine

## 2015-12-27 ENCOUNTER — Other Ambulatory Visit: Payer: Self-pay

## 2015-12-27 NOTE — Telephone Encounter (Signed)
error 

## 2016-01-15 ENCOUNTER — Other Ambulatory Visit: Payer: Self-pay | Admitting: Internal Medicine

## 2016-01-15 MED FILL — METFORMIN HCL ER 500 MG TAB: 500 | 90 days supply | Qty: 360 | Fill #0

## 2016-01-15 MED FILL — LISINOPRIL 10 MG TABLET: 10 | 90 days supply | Qty: 90 | Fill #0

## 2016-01-30 ENCOUNTER — Ambulatory Visit (INDEPENDENT_AMBULATORY_CARE_PROVIDER_SITE_OTHER): Payer: 59 | Admitting: Internal Medicine

## 2016-01-30 ENCOUNTER — Encounter: Payer: Self-pay | Admitting: Internal Medicine

## 2016-01-30 VITALS — BP 149/98 | HR 94 | Temp 97.6°F | Resp 16 | Ht 75.0 in | Wt 234.0 lb

## 2016-01-30 DIAGNOSIS — E11319 Type 2 diabetes mellitus with unspecified diabetic retinopathy without macular edema: Secondary | ICD-10-CM | POA: Diagnosis not present

## 2016-01-30 DIAGNOSIS — Z0000 Encounter for general adult medical examination without abnormal findings: Secondary | ICD-10-CM | POA: Diagnosis not present

## 2016-01-30 DIAGNOSIS — Z23 Encounter for immunization: Secondary | ICD-10-CM

## 2016-01-30 DIAGNOSIS — I10 Essential (primary) hypertension: Secondary | ICD-10-CM | POA: Diagnosis not present

## 2016-01-30 DIAGNOSIS — Z6830 Body mass index (BMI) 30.0-30.9, adult: Secondary | ICD-10-CM

## 2016-01-30 DIAGNOSIS — E1165 Type 2 diabetes mellitus with hyperglycemia: Secondary | ICD-10-CM | POA: Diagnosis not present

## 2016-01-30 DIAGNOSIS — J0100 Acute maxillary sinusitis, unspecified: Secondary | ICD-10-CM | POA: Diagnosis not present

## 2016-01-30 MED ORDER — AMOXICILLIN 875 MG PO TABS: 875.0000 mg | ORAL_TABLET | Freq: Two times a day (BID) | ORAL | Status: DC

## 2016-01-30 MED ORDER — NEOMYCIN-POLYMYXIN-HC 3.5-10000-1 OT SUSP: 3.0000 [drp] | Freq: Four times a day (QID) | OTIC | Status: DC

## 2016-01-30 MED ORDER — LISINOPRIL 10 MG PO TABS: ORAL_TABLET | ORAL | Status: DC

## 2016-01-30 MED FILL — AMOXICILLIN 875 MG TABLET: 875 | 10 days supply | Qty: 20 | Fill #0

## 2016-01-30 MED FILL — NEOMYCIN-POLYMYXIN-HC EAR S: 3.5-10000-1 | 17 days supply | Qty: 10 | Fill #0

## 2016-01-30 NOTE — Patient Instructions (Signed)

## 2016-01-31 LAB — MICROALBUMIN, URINE: Microalb, Ur: 3.8 mg/dL

## 2016-02-01 NOTE — Progress Notes (Signed)
Subjective:    Patient ID: Jamie Foster, male    DOB: 1966/01/27, 50 y.o.   MRN: [id number]  HPI Chief Complaint  Patient presents with  . Annual Exam  . Ear Pain--as in past--intermittent without affecting hearing Responds to cortisporin drops even tho there is never a finding of otit ext.   Patient Active Problem List   Diagnosis Date Noted  . Family hx of prostate cancer 06/22/2013  . HTN (hypertension) 07/19/2012  . DDD (degenerative disc disease), cervical 07/19/2012  . Uncontrolled type 2 diabetes mellitus with retinopathy, without long-term current use of insulin (HCC) 12/21/2015  . Allergic conjunctivitis 06/22/2013  . Allergic rhinitis 06/22/2013  . OSA (obstructive sleep apnea) 10/06/2012  . BMI 30.0-30.9,adult 07/19/2012  . GERD (gastroesophageal reflux disease) 07/19/2012  . Sinusitis, chronic 07/19/2012   See last ENDO note--Jamie Foster does what Jamie Foster wants as usual. Just back from [place]--family. 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 Jamie Foster appears well-developed and well-nourished.  HENT:  Head: Normocephalic and atraumatic.  Right Ear: Hearing, tympanic membrane, external ear and ear canal normal.  Left Ear: Hearing, tympanic membrane, external ear and ear canal normal.  Nose: Nose normal.  Mouth/Throat: Uvula is midline, oropharynx is clear and moist and mucous membranes are normal.  Eyes: Conjunctivae, EOM and lids are normal. Pupils are equal, round, and reactive to light. Right eye exhibits no discharge. Left eye exhibits no discharge. No scleral icterus.  Neck: Trachea normal and normal range of motion. Neck supple. Carotid bruit is not  present.  Cardiovascular: Normal rate, regular rhythm, normal heart sounds, intact distal pulses and normal pulses.   No murmur heard. Pulmonary/Chest: Effort normal and breath sounds normal. No respiratory distress. Jamie Foster has no wheezes. Jamie Foster has no rhonchi. Jamie Foster has no rales.  Abdominal: Soft. Normal appearance and bowel sounds are normal. Jamie Foster exhibits no abdominal bruit. There is no tenderness.  Musculoskeletal: Normal range of motion. Jamie Foster exhibits no edema or tenderness.  Lymphadenopathy:       Head (right side): No submental, no submandibular, no tonsillar, no preauricular, no posterior auricular and no occipital adenopathy present.       Head (left side): No submental, no submandibular, no tonsillar, no preauricular, no posterior auricular and no occipital adenopathy present.    Jamie Foster has no cervical adenopathy.  Neurological: Jamie Foster is alert and oriented to person, place, and time. Jamie Foster has normal strength and normal reflexes. No cranial nerve deficit or sensory deficit. Coordination and gait normal.  Skin: Skin is warm, dry and intact. No lesion and no rash noted.  Psychiatric: Jamie Foster has a normal mood and affect. His speech is normal and behavior is normal. Judgment and thought content normal.  BP 149/98 mmHg  Pulse 94  Temp(Src) 97.6 F (36.4 C)  Resp 16  Ht 6\' 3"  (1.905 m)  Wt 234 lb (106.142 kg)  BMI 29.25 kg/m2       Assessment & Plan:  Annual physical exam - Plan: Ambulatory referral to Gastroenterology -prevnar -Tdap  Essential hypertension--not currently controlled//no meds for few days --adher to meds and exercise/outside BP to document control Uncontrolled type 2 diabetes mellitus with retinopathy,  without long-term current use of insulin, macular edema presence unspecified, unspecified retinopathy severity (HCC)  - Plan: Microalbumin, urine, - Diabetes Foot Exam stable!! - urged compliance with Dr Charlean Sanfilippo regimen  BMI 30.0-30.9,adult  Acute maxillary sinusitis, recurrence not  specified  Meds ordered this encounter  Medications  . lisinopril (PRINIVIL,ZESTRIL) 10 MG tablet    Sig: TAKE 1 TABLET (10 MG TOTAL) BY MOUTH DAILY    Dispense:  90 tablet    Refill:  3  . amoxicillin (AMOXIL) 875 MG tablet    Sig: Take 1 tablet (875 mg total) by mouth 2 (two) times daily.    Dispense:  20 tablet    Refill:  0  . neomycin-polymyxin-hydrocortisone (CORTISPORIN) 3.5-10000-1 otic suspension    Sig: Place 3 drops into the left ear 4 (four) times daily.    Dispense:  10 mL    Refill:  2   Addend labs Results for orders placed or performed in visit on 01/30/16  Microalbumin, urine  Result Value Ref Range   Microalb, Ur 3.8 Not estab mg/dL

## 2016-02-26 MED FILL — JANUVIA 100 MG TABLET: 100 | 90 days supply | Qty: 90 | Fill #1

## 2016-03-21 ENCOUNTER — Encounter: Payer: Self-pay | Admitting: Internal Medicine

## 2016-03-21 ENCOUNTER — Other Ambulatory Visit (INDEPENDENT_AMBULATORY_CARE_PROVIDER_SITE_OTHER): Payer: 59 | Admitting: *Deleted

## 2016-03-21 ENCOUNTER — Ambulatory Visit (INDEPENDENT_AMBULATORY_CARE_PROVIDER_SITE_OTHER): Payer: 59 | Admitting: Internal Medicine

## 2016-03-21 VITALS — BP 122/80 | HR 88 | Temp 97.9°F | Resp 12 | Wt 235.0 lb

## 2016-03-21 DIAGNOSIS — E1165 Type 2 diabetes mellitus with hyperglycemia: Secondary | ICD-10-CM

## 2016-03-21 DIAGNOSIS — E11319 Type 2 diabetes mellitus with unspecified diabetic retinopathy without macular edema: Secondary | ICD-10-CM

## 2016-03-21 LAB — POCT GLYCOSYLATED HEMOGLOBIN (HGB A1C): Hemoglobin A1C: 8.7

## 2016-03-21 MED ORDER — SITAGLIPTIN PHOSPHATE 100 MG PO TABS: ORAL_TABLET | ORAL | Status: DC

## 2016-03-21 MED ORDER — METFORMIN HCL ER 500 MG PO TB24: ORAL_TABLET | ORAL | Status: DC

## 2016-03-21 MED ORDER — GLIPIZIDE 10 MG PO TABS: ORAL_TABLET | ORAL | Status: DC

## 2016-03-21 MED FILL — glipiZIDE 10 MG TABS: 10 | 90 days supply | Qty: 180 | Fill #0

## 2016-03-21 MED FILL — METFORMIN HCL ER 500 MG TAB: 500 | 90 days supply | Qty: 360 | Fill #0

## 2016-03-21 NOTE — Patient Instructions (Signed)
Please continue: - Metformin ER 1000 mg 2x day - Glipizide 10 mg 2x a day  Move Januvia 100 mg to before breakfast.  Please come back for a follow-up appointment in 3 months.

## 2016-03-21 NOTE — Progress Notes (Signed)
Patient ID: Jamie Foster, male   DOB: 12-06-1965, 50 y.o.   MRN: [id number]  HPI: Jamie Foster is a 50 y.o.-year-old male, returning for f/u for DM2, dx 2006, non-insulin-dependent, uncontrolled, with complications (DR). Last visit 3 mo ago.  Last hemoglobin A1c was: Lab Results  Component Value Date   HGBA1C 11.1* 12/21/2015   HGBA1C 9.0* 03/08/2015   HGBA1C 10.5* 12/07/2014  He had 1 steroid inj in his R shoulder this year and 1 last year. He also had a prednisone taper 12/2013.   Pt was on a regimen of: - Metformin ER 1000 mg bid - Januvia 100 mg in the evening (!) - Glipizide 10 mg bid He tried Invokana 100 mg in am in 03/2015 >> increased urination and urgency >> had to stop. He was JanuMet 50/1000 mg bid >> diarrhea and abdominal pain, mm weakness >> stopped 2 mo ago.  Pt checks his sugars - no log, no meter:  - am: 168-211 >> 180-230 >> 125-140, 180x1 >> 128-143 >> 110-200s, 289 >> 133-185, 203 >> 140-210 >> 110-120, 186 - 2h after b'fast: n/c >> 180s >> n/c - before lunch: n/c  - 2h after lunch: n/c >> 150-200 >> n/c Soccer 4x a week - before dinner: n/c >> 180-220 >> 1x 298 >> n/c >> 200-224 >> 150-160s - 2h after dinner: n/c >> 177-180 >> n/c >> 1x 368 >> n/c - bedtime:147 (on 500 mg bid >> sugars 250-300) >> 230-240 >> 150s >> n/c >> 248 >> n/c >> 180-240 >> 110, 196 No lows. Lowest sugar was 110 >> 140 >> 97; highest 248 >> 240 >> 205; has hypoglycemia awareness - ? level  He has a TrueTrack and a OneTouch Ultra meter glucometer.  Pt's meals are: - Breakfast: oatmeal + 50-50% milk  - Lunch: steak + veggies; sometimes rice, veggies, chicken, tomato stew - Dinner: fufu gari-pounded yam: vegetable soup  - Snacks: no No sodas; alcohol only at parties  - Has mild CKD, last BUN/creatinine:  Lab Results  Component Value Date   BUN 11 12/21/2015   CREATININE 1.16 12/21/2015  On Lisinopril 10 - last set of lipids: Lab Results  Component Value Date   CHOL 202*  12/21/2015   HDL 50.80 12/21/2015   LDLCALC 134* 12/21/2015   TRIG 85.0 12/21/2015   CHOLHDL 4 12/21/2015  Not on a statin. - last eye exam was in 11/29/2014. + DR OU. - no numbness and tingling in his feet.  He also has a history of OSA, GERD, HTN, HL, H pylori ulcer.  ROS: Constitutional: no weight gain, no fatigue, no subjective hyperthermia/hypothermia Eyes: no blurry vision, no xerophthalmia ENT: no sore throat, no nodules palpated in throat, no dysphagia/odynophagia, no hoarseness Cardiovascular: no CP/SOB/palpitations/leg swelling Respiratory: no cough/SOB Gastrointestinal: no N/V/D/C/heartburn Musculoskeletal: no muscle/joint aches Skin: no rashes Neurological: no tremors/numbness/tingling/dizziness  I reviewed pt's medications, allergies, PMH, social hx, family hx, and changes were documented in the history of present illness. Otherwise, unchanged from my initial visit note.  PE: BP 122/80 mmHg  Pulse 88  Temp(Src) 97.9 F (36.6 C) (Oral)  Resp 12  Wt 235 lb (106.595 kg)  SpO2 96% Body mass index is 29.37 kg/(m^2).  Wt Readings from Last 3 Encounters:  03/21/16 235 lb (106.595 kg)  01/30/16 234 lb (106.142 kg)  12/21/15 235 lb (106.595 kg)   Constitutional: overweight, in NAD Eyes: PERRLA, EOMI, no exophthalmos ENT: moist mucous membranes, no thyromegaly, no cervical lymphadenopathy Cardiovascular: RRR, No  MRG Respiratory: CTA B Gastrointestinal: abdomen soft, NT, ND, BS+ Musculoskeletal: no deformities, strength intact in all 4 Skin: moist, warm, no rashes Neurological: no tremor with outstretched hands, DTR normal in all 4  ASSESSMENT: 1. DM2, non-insulin-dependent, uncontrolled, without complications  PLAN:  1. Patient with long-standing, uncontrolled diabetes, on oral antidiabetic regimen, with improving sugars in last 3 mo, likely 2/2 starting to take meds as advised.  - He has repeatedly refused insulin in the past. He also refused any other  medicine... - as he is taking the Januvia at night >> will move to am - again advised him to write sugar values down >> refuses as he has a different schedule... - I advised him to:  Patient Instructions  Please continue: - Metformin ER 1000 mg 2x day - Glipizide 10 mg 2x a day  Move Januvia 100 mg to before breakfast.  Please come back for a follow-up appointment in 3 months.  - again advised him to start checking sugars at different times of the day - check 2 times a day, rotating checks and bring me the log or meter - he needs a new eye exam >> advised to schedule  - check Hbba1c today >> 8.7% (better!) - Return to clinic in 3 mo with sugar log

## 2016-04-30 MED FILL — LISINOPRIL 10 MG TABLET: 10 | 90 days supply | Qty: 90 | Fill #0

## 2016-05-05 MED FILL — JANUVIA 100 MG TABLET: 100 | 90 days supply | Qty: 90 | Fill #0

## 2016-05-23 ENCOUNTER — Encounter: Payer: Self-pay | Admitting: Gastroenterology

## 2016-06-17 ENCOUNTER — Ambulatory Visit: Payer: 59 | Admitting: Internal Medicine

## 2016-07-09 ENCOUNTER — Ambulatory Visit (AMBULATORY_SURGERY_CENTER): Payer: Self-pay | Admitting: *Deleted

## 2016-07-09 VITALS — Ht 74.25 in | Wt 235.0 lb

## 2016-07-09 DIAGNOSIS — Z1211 Encounter for screening for malignant neoplasm of colon: Secondary | ICD-10-CM

## 2016-07-09 MED ORDER — NA SULFATE-K SULFATE-MG SULF 17.5-3.13-1.6 GM/177ML PO SOLN: 1.0000 | Freq: Once | ORAL | 0 refills | Status: AC

## 2016-07-09 MED FILL — SUPREP BOWEL PREP KIT: 17.5-3.13-1 | 1 days supply | Qty: 354 | Fill #0

## 2016-07-09 NOTE — Progress Notes (Signed)
No egg or soy allergy known to patient  No issues with past sedation with any surgeries  or procedures, no intubation problems  No diet pills per patient No home 02 use per patient  No blood thinners per patient  Pt denies issues with constipation   

## 2016-07-22 MED FILL — glipiZIDE 10 MG TABS: 10 | 90 days supply | Qty: 180 | Fill #1

## 2016-07-22 MED FILL — JANUVIA 100 MG TABLET: 100 | 90 days supply | Qty: 90 | Fill #1

## 2016-07-22 MED FILL — METFORMIN HCL ER 500 MG TAB: 500 | 90 days supply | Qty: 360 | Fill #1

## 2016-07-23 ENCOUNTER — Encounter: Payer: Self-pay | Admitting: Gastroenterology

## 2016-07-23 ENCOUNTER — Ambulatory Visit (AMBULATORY_SURGERY_CENTER): Payer: 59 | Admitting: Gastroenterology

## 2016-07-23 VITALS — BP 139/91 | HR 84 | Temp 96.0°F | Resp 22 | Ht 74.0 in | Wt 235.0 lb

## 2016-07-23 DIAGNOSIS — E119 Type 2 diabetes mellitus without complications: Secondary | ICD-10-CM | POA: Diagnosis not present

## 2016-07-23 DIAGNOSIS — Z1211 Encounter for screening for malignant neoplasm of colon: Secondary | ICD-10-CM | POA: Diagnosis not present

## 2016-07-23 DIAGNOSIS — G4733 Obstructive sleep apnea (adult) (pediatric): Secondary | ICD-10-CM | POA: Diagnosis not present

## 2016-07-23 DIAGNOSIS — E669 Obesity, unspecified: Secondary | ICD-10-CM | POA: Diagnosis not present

## 2016-07-23 DIAGNOSIS — I10 Essential (primary) hypertension: Secondary | ICD-10-CM | POA: Diagnosis not present

## 2016-07-23 MED ORDER — SODIUM CHLORIDE 0.9 % IV SOLN: 500.0000 mL | INTRAVENOUS | Status: DC

## 2016-07-23 NOTE — Op Note (Signed)
[facility] Patient Name: Jamie Foster Carrier Procedure Date: 07/23/2016 8:44 AM MRN: [id number] Endoscopist: Rachael Feeaniel P Dyllin Gulley , MD Age: 50 Referring MD:  Date of Birth: Apr 30, 1966 Gender: Male Account #: [id number] Procedure:                Colonoscopy Indications:              Screening for colorectal malignant neoplasm Medicines:                Monitored Anesthesia Care Procedure:                Pre-Anesthesia Assessment:                           - Prior to the procedure, a History and Physical                            was performed, and patient medications and                            allergies were reviewed. The patient's tolerance of                            previous anesthesia was also reviewed. The risks                            and benefits of the procedure and the sedation                            options and risks were discussed with the patient.                            All questions were answered, and informed consent                            was obtained. Prior Anticoagulants: The patient has                            taken no previous anticoagulant or antiplatelet                            agents. ASA Grade Assessment: II - A patient with                            mild systemic disease. After reviewing the risks                            and benefits, the patient was deemed in                            satisfactory condition to undergo the procedure.                           After obtaining informed consent, the colonoscope  was passed under direct vision. Throughout the                            procedure, the patient's blood pressure, pulse, and                            oxygen saturations were monitored continuously. The                            Model PCF-H190L [id number]) scope was introduced                            through the anus and advanced to the the cecum,                            identified by  appendiceal orifice and ileocecal                            valve. The colonoscopy was performed without                            difficulty. The patient tolerated the procedure                            well. The quality of the bowel preparation was                            excellent. The ileocecal valve, appendiceal                            orifice, and rectum were photographed. Scope In: 8:49:15 AM Scope Out: 9:03:15 AM Scope Withdrawal Time: 0 hours 10 minutes 47 seconds  Total Procedure Duration: 0 hours 14 minutes 0 seconds  Findings:                 The entire examined colon appeared normal on direct                            and retroflexion views. Complications:            No immediate complications. Estimated blood loss:                            None. Estimated Blood Loss:     Estimated blood loss: none. Impression:               - The entire examined colon is normal on direct and                            retroflexion views.                           - No specimens collected. Recommendation:           - Patient has a contact number available for  emergencies. The signs and symptoms of potential                            delayed complications were discussed with the                            patient. Return to normal activities tomorrow.                            Written discharge instructions were provided to the                            patient.                           - Resume previous diet.                           - Continue present medications.                           - Repeat colonoscopy in 10 years for screening                            purposes. Rachael Fee, MD 07/23/2016 9:05:11 AM This report has been signed electronically.

## 2016-07-23 NOTE — Progress Notes (Signed)
Report to PACU, RN, vss, BBS= Clear.  

## 2016-07-23 NOTE — Patient Instructions (Signed)
YOU HAD AN ENDOSCOPIC PROCEDURE TODAY AT THE [facility]:   Refer to the procedure report that was given to you for any specific questions about what was found during the examination.  If the procedure report does not answer your questions, please call your gastroenterologist to clarify.  If you requested that your care partner not be given the details of your procedure findings, then the procedure report has been included in a sealed envelope for you to review at your convenience later.  YOU SHOULD EXPECT: Some feelings of bloating in the abdomen. Passage of more gas than usual.  Walking can help get rid of the air that was put into your GI tract during the procedure and reduce the bloating. If you had a lower endoscopy (such as a colonoscopy or flexible sigmoidoscopy) you may notice spotting of blood in your stool or on the toilet paper. If you underwent a bowel prep for your procedure, you may not have a normal bowel movement for a few days.  Please Note:  You might notice some irritation and congestion in your nose or some drainage.  This is from the oxygen used during your procedure.  There is no need for concern and it should clear up in a day or so.  SYMPTOMS TO REPORT IMMEDIATELY:   Following lower endoscopy (colonoscopy or flexible sigmoidoscopy):  Excessive amounts of blood in the stool  Significant tenderness or worsening of abdominal pains  Swelling of the abdomen that is new, acute  Fever of 100F or higher   For urgent or emergent issues, a gastroenterologist can be reached at any hour by calling [phone].   DIET:  We do recommend a small meal at first, but then you may proceed to your regular diet.  Drink plenty of fluids but you should avoid alcoholic beverages for 24 hours.  ACTIVITY:  You should plan to take it easy for the rest of today and you should NOT DRIVE or use heavy machinery until tomorrow (because of the sedation medicines used during the test).     FOLLOW UP: Our staff will call the number listed on your records the next business day following your procedure to check on you and address any questions or concerns that you may have regarding the information given to you following your procedure. If we do not reach you, we will leave a message.  However, if you are feeling well and you are not experiencing any problems, there is no need to return our call.  We will assume that you have returned to your regular daily activities without incident.  If any biopsies were taken you will be contacted by phone or by letter within the next 1-3 weeks.  Please call us at [phone] if you have not heard about the biopsies in 3 weeks.    SIGNATURES/CONFIDENTIALITY: You and/or your care partner have signed paperwork which will be entered into your electronic medical record.  These signatures attest to the fact that that the information above on your After Visit Summary has been reviewed and is understood.  Full responsibility of the confidentiality of this discharge information lies with you and/or your care-partner.  Normal exam.  Repeat colonoscopy in 10 years 2027.  

## 2016-07-24 ENCOUNTER — Telehealth: Payer: Self-pay | Admitting: *Deleted

## 2016-07-24 NOTE — Telephone Encounter (Signed)
Follow-up, did not leave message per pt request, no identifier

## 2016-07-29 MED FILL — NEOMYCIN-POLYMYXIN-HC EAR S: 3.5-10000-1 | 17 days supply | Qty: 10 | Fill #1

## 2016-07-29 MED FILL — LISINOPRIL 10 MG TABLET: 10 | 90 days supply | Qty: 90 | Fill #1

## 2016-08-07 ENCOUNTER — Other Ambulatory Visit: Payer: Self-pay

## 2016-08-07 ENCOUNTER — Encounter: Payer: Self-pay | Admitting: Internal Medicine

## 2016-08-07 ENCOUNTER — Ambulatory Visit (INDEPENDENT_AMBULATORY_CARE_PROVIDER_SITE_OTHER): Payer: 59 | Admitting: Internal Medicine

## 2016-08-07 VITALS — BP 128/84 | HR 88 | Ht 73.5 in | Wt 241.0 lb

## 2016-08-07 DIAGNOSIS — E1165 Type 2 diabetes mellitus with hyperglycemia: Secondary | ICD-10-CM

## 2016-08-07 DIAGNOSIS — E11319 Type 2 diabetes mellitus with unspecified diabetic retinopathy without macular edema: Secondary | ICD-10-CM | POA: Diagnosis not present

## 2016-08-07 LAB — POCT GLYCOSYLATED HEMOGLOBIN (HGB A1C): Hemoglobin A1C: 8.1

## 2016-08-07 MED ORDER — SITAGLIPTIN PHOSPHATE 100 MG PO TABS: ORAL_TABLET | ORAL | 1 refills | Status: DC

## 2016-08-07 MED ORDER — METFORMIN HCL ER 500 MG PO TB24: ORAL_TABLET | ORAL | 1 refills | Status: DC

## 2016-08-07 MED ORDER — GLIPIZIDE 10 MG PO TABS: ORAL_TABLET | ORAL | 1 refills | Status: DC

## 2016-08-07 NOTE — Progress Notes (Signed)
Patient ID: Jamie Foster, male   DOB: 18-Dec-1965, 50 y.o.   MRN: [id number]   HPI: Jamie Foster is a 50 y.o.-year-old male, returning for f/u for DM2, dx 2006, non-insulin-dependent, uncontrolled, with complications (DR). Last visit 3 mo ago.  Last hemoglobin A1c was: Lab Results  Component Value Date   HGBA1C 8.7 03/21/2016   HGBA1C 11.1 (H) 12/21/2015   HGBA1C 9.0 (H) 03/08/2015  He had 1 steroid inj in his R shoulder this year and 1 last year. He also had a prednisone taper 12/2013.   Pt was on a regimen of: - Metformin ER 1000 mg bid - Januvia 100 mg in am - Glipizide 10 mg bid He tried Invokana 100 mg in am in 03/2015 >> increased urination and urgency >> had to stop. He was JanuMet 50/1000 mg bid >> diarrhea and abdominal pain, mm weakness >> stopped 2 mo ago.  Pt checks his sugars - no log, no meter:  - am: 125-140, 180x1 >> 128-143 >> 110-200s, 289 >> 133-185, 203 >> 140-210 >> 110-120, 186 >> 112 (he does not feel good at this level-146 - 2h after b'fast: n/c >> 180s >> n/c - before lunch: n/c  - 2h after lunch: n/c >> 150-200 >> n/c Soccer 4x a week - before dinner: n/c >> 180-220 >> 1x 298 >> n/c >> 200-224 >> 150-160s >> 111-120 - 2h after dinner: n/c >> 177-180 >> n/c >> 1x 368 >> n/c - bedtime:sugars 250-300) >> 230-240 >> 150s >> n/c >> 248 >> n/c >> 180-240 >> 110, 196 >> n/c No lows. Lowest sugar was 110 >> 140 >> 97 >> 80s; highest 248 >> 240 >> 205 >> 230; has hypoglycemia awareness - ? level  He has a TrueTrack and a OneTouch Ultra meter glucometer.  Pt's meals are: - Breakfast: oatmeal + 50-50% milk  - Lunch: steak + veggies; sometimes rice, veggies, chicken, tomato stew - Dinner: fufu gari-pounded yam: vegetable soup  - Snacks: no No sodas; alcohol only at parties  - Has mild CKD, last BUN/creatinine:  Lab Results  Component Value Date   BUN 11 12/21/2015   CREATININE 1.16 12/21/2015  On Lisinopril 10 - last set of lipids: Lab Results   Component Value Date   CHOL 202 (H) 12/21/2015   HDL 50.80 12/21/2015   LDLCALC 134 (H) 12/21/2015   TRIG 85.0 12/21/2015   CHOLHDL 4 12/21/2015  Not on a statin. - last eye exam was in 11/29/2014. + DR OU. - no numbness and tingling in his feet.  He also has a history of OSA, GERD, HTN, HL, H pylori ulcer.  ROS: Constitutional: no weight gain, no fatigue, no subjective hyperthermia/hypothermia Eyes: no blurry vision, no xerophthalmia ENT: no sore throat, no nodules palpated in throat, no dysphagia/odynophagia, no hoarseness Cardiovascular: no CP/SOB/palpitations/leg swelling Respiratory: no cough/SOB Gastrointestinal: no N/V/D/C/heartburn Musculoskeletal: no muscle/joint aches Skin: no rashes Neurological: no tremors/numbness/tingling/dizziness  I reviewed pt's medications, allergies, PMH, social hx, family hx, and changes were documented in the history of present illness. Otherwise, unchanged from my initial visit note.  PE: BP 128/84 (BP Location: Left Arm, Patient Position: Sitting)   Pulse 88   Ht 6' 1.5" (1.867 m)   Wt 241 lb (109.3 kg)   SpO2 94%   BMI 31.36 kg/m  Body mass index is 31.36 kg/m.  Wt Readings from Last 3 Encounters:  08/07/16 241 lb (109.3 kg)  07/23/16 235 lb (106.6 kg)  07/09/16 235 lb (106.6 kg)  Constitutional: overweight, in NAD Eyes: PERRLA, EOMI, no exophthalmos ENT: moist mucous membranes, no thyromegaly, no cervical lymphadenopathy Cardiovascular: RRR, No MRG Respiratory: CTA B Gastrointestinal: abdomen soft, NT, ND, BS+ Musculoskeletal: no deformities, strength intact in all 4 Skin: moist, warm, no rashes Neurological: no tremor with outstretched hands, DTR normal in all 4  ASSESSMENT: 1. DM2, non-insulin-dependent, uncontrolled, without complications  PLAN:  1. Patient with long-standing, uncontrolled diabetes, on oral antidiabetic regimen, with improving sugars in last 3 mo. - He has repeatedly refused insulin in the past. He  also refused any other medicine... - he was taking the Januvia at night before >> moved to am at last visit - I did advise him to write sugar values down >> refused as he has a different schedule... - I advised him to:  Patient Instructions  Please continue: - Metformin ER 1000 mg 2x day - Glipizide 10 mg 2x a day - Januvia 100 mg to before breakfast.  Please come back for a follow-up appointment in 3 months.  - again advised him to start checking sugars at different times of the day - check 2 times a day, rotating checks and bring me the log or meter - he needs a new eye exam >> advised to schedule  - check Hbba1c today >> 8.1% (better!) - Return to clinic in 3 mo with sugar log   Carlus Pavlov, MD PhD [facility]

## 2016-08-07 NOTE — Patient Instructions (Addendum)
Please continue: - Metformin ER 1000 mg 2x day - Glipizide 10 mg 2x a day - Januvia 100 mg to before breakfast.  Please come back for a follow-up appointment in 3 months.

## 2016-10-27 ENCOUNTER — Ambulatory Visit (INDEPENDENT_AMBULATORY_CARE_PROVIDER_SITE_OTHER): Payer: 59 | Admitting: Internal Medicine

## 2016-10-27 ENCOUNTER — Encounter: Payer: Self-pay | Admitting: Internal Medicine

## 2016-10-27 ENCOUNTER — Ambulatory Visit: Payer: 59 | Admitting: Internal Medicine

## 2016-10-27 VITALS — BP 157/88 | HR 93 | Wt 239.0 lb

## 2016-10-27 DIAGNOSIS — E1165 Type 2 diabetes mellitus with hyperglycemia: Secondary | ICD-10-CM

## 2016-10-27 DIAGNOSIS — E11319 Type 2 diabetes mellitus with unspecified diabetic retinopathy without macular edema: Secondary | ICD-10-CM | POA: Diagnosis not present

## 2016-10-27 LAB — POCT GLYCOSYLATED HEMOGLOBIN (HGB A1C): Hemoglobin A1C: 10.3

## 2016-10-27 MED ORDER — METFORMIN HCL ER 500 MG PO TB24: ORAL_TABLET | ORAL | 1 refills | Status: DC

## 2016-10-27 MED ORDER — GLUCOSE BLOOD VI STRP: ORAL_STRIP | 5 refills | Status: DC

## 2016-10-27 MED ORDER — SITAGLIPTIN PHOSPHATE 100 MG PO TABS: ORAL_TABLET | ORAL | 1 refills | Status: DC

## 2016-10-27 MED ORDER — GLIPIZIDE 10 MG PO TABS: ORAL_TABLET | ORAL | 1 refills | Status: DC

## 2016-10-27 MED FILL — glipiZIDE 10 MG TABS: 10 | 90 days supply | Qty: 180 | Fill #0

## 2016-10-27 MED FILL — ONE TOUCH ULTRA TEST STRIPS: 50 days supply | Qty: 100 | Fill #0

## 2016-10-27 MED FILL — JANUVIA 100 MG TABLET: 100 | 90 days supply | Qty: 90 | Fill #0

## 2016-10-27 MED FILL — METFORMIN HCL ER 500 MG TAB: 500 | 90 days supply | Qty: 360 | Fill #0

## 2016-10-27 MED FILL — LISINOPRIL 10 MG TABLET: 10 | 90 days supply | Qty: 90 | Fill #2

## 2016-10-27 NOTE — Patient Instructions (Signed)
Patient Instructions  Please continue: - Metformin ER 1000 mg 2x day - Glipizide 10 mg 2x a day - Januvia 100 mg to before breakfast.  Please come back for a follow-up appointment in 3 months.

## 2016-10-27 NOTE — Progress Notes (Signed)
Patient ID: Jamie Foster, male   DOB: 01-Jul-1966, 50 y.o.   MRN: [id number]   HPI: Jamie Berlinnthony O Brunelli is a 50 y.o.-year-old male, returning for f/u for DM2, dx 2006, non-insulin-dependent, uncontrolled, with complications (DR). Last visit 3 mo ago. He is late 30 min today for his appt.  Last hemoglobin A1c was: Lab Results  Component Value Date   HGBA1C 8.1 08/07/2016   HGBA1C 8.7 03/21/2016   HGBA1C 11.1 (H) 12/21/2015  He had 1 steroid inj in his R shoulder this year and 1 last year. He also had a prednisone taper 12/2013.   Pt was on a regimen of: - Metformin ER 1000 mg bid - Januvia 100 mg in am - Glipizide 10 mg bid He tried Invokana 100 mg in am in 03/2015 >> increased urination and urgency >> had to stop. He was JanuMet 50/1000 mg bid >> diarrhea and abdominal pain, mm weakness >> stopped 2 mo ago.  Pt checks his sugars - no log, no meter:  - am: 110-200s, 289 >> 133-185, 203 >> 140-210 >> 110-120, 186 >> 112 (he does not feel good at this level)-146 >> 120-140 - 2h after b'fast: n/c >> 180s >> n/c - before lunch: n/c  - 2h after lunch: n/c >> 150-200 >> n/c Soccer 4x a week - before dinner: n/c >> 180-220 >> 1x 298 >> n/c >> 200-224 >> 150-160s >> 111-120 >> 170-180, higher if he skips lunch: 220 - 2h after dinner: n/c >> 177-180 >> n/c >> 1x 368 >> n/c - bedtime:sugars 250-300) >> 230-240 >> 150s >> n/c >> 248 >> n/c >> 180-240 >> 110, 196 >> n/c  He has a Personal assistantTrueTrack and a OneTouch Ultra meter glucometer.  Pt's meals are: - Breakfast: oatmeal + 50-50% milk  - Lunch: steak + veggies; sometimes rice, veggies, chicken, tomato stew - Dinner: fufu gari-pounded yam: vegetable soup  - Snacks: no No sodas; alcohol only at parties  - Has mild CKD, last BUN/creatinine:  Lab Results  Component Value Date   BUN 11 12/21/2015   CREATININE 1.16 12/21/2015  On Lisinopril 10 - last set of lipids: Lab Results  Component Value Date   CHOL 202 (H) 12/21/2015   HDL 50.80  12/21/2015   LDLCALC 134 (H) 12/21/2015   TRIG 85.0 12/21/2015   CHOLHDL 4 12/21/2015  Not on a statin. - last eye exam was in 11/29/2014. + DR OU. - no numbness and tingling in his feet.  He also has a history of OSA, GERD, HTN, HL, H pylori ulcer.  ROS: Constitutional: no weight gain, no fatigue, no subjective hyperthermia/hypothermia Eyes: no blurry vision, no xerophthalmia ENT: no sore throat, no nodules palpated in throat, no dysphagia/odynophagia, no hoarseness Cardiovascular: no CP/SOB/palpitations/leg swelling Respiratory: no cough/SOB Gastrointestinal: no N/V/D/C/heartburn Musculoskeletal: no muscle/joint aches Skin: no rashes Neurological: no tremors/numbness/tingling/dizziness  I reviewed pt's medications, allergies, PMH, social hx, family hx, and changes were documented in the history of present illness. Otherwise, unchanged from my initial visit note.  PE: BP (!) 157/88   Pulse 93   Wt 239 lb (108.4 kg)   BMI 31.10 kg/m  Body mass index is 31.1 kg/m.  Wt Readings from Last 3 Encounters:  10/27/16 239 lb (108.4 kg)  08/07/16 241 lb (109.3 kg)  07/23/16 235 lb (106.6 kg)   Constitutional: overweight, in NAD Eyes: PERRLA, EOMI, no exophthalmos ENT: moist mucous membranes, no thyromegaly, no cervical lymphadenopathy Cardiovascular: RRR, No MRG Respiratory: CTA B Gastrointestinal: abdomen soft,  NT, ND, BS+ Musculoskeletal: no deformities, strength intact in all 4 Skin: moist, warm, no rashes Neurological: no tremor with outstretched hands, DTR normal in all 4  ASSESSMENT: 1. DM2, non-insulin-dependent (refused insulin), uncontrolled, with complications - DR  PLAN:  1. Patient with long-standing, uncontrolled diabetes, on oral antidiabetic regimen, with worsening sugars since last visit: more stress - He has repeatedly refused insulin in the past and again at this visit. He also refused any other medicine... - I did advise him repeatedly to write sugar  values down >> refused as he has a different schedule... - [place] is going to [place] next week for 6 weeks >> he would like to continue current regimen and work on his diet while there.  - I advised him to:  Patient Instructions  Please continue: - Metformin ER 1000 mg 2x day - Glipizide 10 mg 2x a day - Januvia 100 mg to before breakfast.  Please come back for a follow-up appointment in 2 months.  - again advised him to start checking sugars at different times of the day - check 2 times a day, rotating checks and bring me the log or meter - he needs a new eye exam >> again advised to schedule  - check Hbba1c today >> 10.3% (much worse!) - Return to clinic in 60mo with sugar log   Carlus Pavlovristina Oneika Simonian, MD PhD [facility]

## 2016-12-31 ENCOUNTER — Ambulatory Visit (INDEPENDENT_AMBULATORY_CARE_PROVIDER_SITE_OTHER): Payer: 59 | Admitting: Family Medicine

## 2016-12-31 VITALS — BP 170/110 | HR 101 | Temp 98.0°F | Resp 17 | Ht 73.5 in | Wt 243.0 lb

## 2016-12-31 DIAGNOSIS — S61402A Unspecified open wound of left hand, initial encounter: Secondary | ICD-10-CM

## 2016-12-31 DIAGNOSIS — S61412A Laceration without foreign body of left hand, initial encounter: Secondary | ICD-10-CM

## 2016-12-31 MED ORDER — TRAMADOL HCL 50 MG PO TABS: 50.0000 mg | ORAL_TABLET | Freq: Three times a day (TID) | ORAL | 0 refills | Status: AC | PRN

## 2016-12-31 MED ORDER — CEPHALEXIN 500 MG PO CAPS: 500.0000 mg | ORAL_CAPSULE | Freq: Three times a day (TID) | ORAL | 0 refills | Status: DC

## 2016-12-31 MED FILL — CEPHALEXIN 500 MG CAPSULE: 500 | 7 days supply | Qty: 21 | Fill #0

## 2016-12-31 NOTE — Patient Instructions (Addendum)
  Start  Keflex 500 mg three times daily for 7 days to prevent skin infection. Return in 7-10 days for suture removal.    WOUND CARE Please return in 7-10 days to have your stitches/staples removed or sooner if you have concerns. Marland Kitchen. Keep area clean and dry for 24 hours. Do not remove bandage, if applied. . After 24 hours, remove bandage and wash wound gently with mild soap and warm water. Reapply a new bandage after cleaning wound, if directed. . Continue daily cleansing with soap and water until stitches/staples are removed. . Do not apply any ointments or creams to the wound while stitches/staples are in place, as this may cause delayed healing. . Notify the office if you experience any of the following signs of infection: Swelling, redness, pus drainage, streaking, fever >101.0 F . Notify the office if you experience excessive bleeding that does not stop after 15-20 minutes of constant, firm pressure.    IF you received an x-ray today, you will receive an invoice from [facility]. Please contact [facility] at [phone] with questions or concerns regarding your invoice.   IF you received labwork today, you will receive an invoice from [facility]. Please contact [facility] at [phone] with questions or concerns regarding your invoice.   Our billing staff will not be able to assist you with questions regarding bills from these companies.  You will be contacted with the lab results as soon as they are available. The fastest way to get your results is to activate your My Chart account. Instructions are located on the last page of this paperwork. If you have not heard from us regarding the results in 2 weeks, please contact this office.

## 2016-12-31 NOTE — Progress Notes (Signed)
Patient ID: Jamie Foster, male    DOB: Feb 08, 1966, 51 y.o.   MRN: [id number]  PCP: No primary care provider on file.  No chief complaint on file.   Subjective:  HPI 51 year old male, presents for evaluation of cut on upper 3rd finger of left hand.  Pt reports that he was working on his automobile with gloves on and he was attempting to rotate a part on his vehicle and inadvertently cut his upper 3rd finger. He was able to control bleeding and come into the clinic for further evaluation.  Social History   Social History  . Marital status: Married    Spouse name: N/A  . Number of children: N/A  . Years of education: N/A   Occupational History  . Not on file.   Social History Main Topics  . Smoking status: Never Smoker  . Smokeless tobacco: Never Used  . Alcohol use No  . Drug use: No  . Sexual activity: Yes   Other Topics Concern  . Not on file   Social History Narrative   Married.Education: College/Other. Exercise: Walks.    Family History  Problem Relation Age of Onset  . Hypertension Mother   . Colon cancer Neg Hx   . Colon polyps Neg Hx      Review of Systems  Patient Active Problem List   Diagnosis Date Noted  . Uncontrolled type 2 diabetes mellitus with retinopathy, without long-term current use of insulin ([place]) 12/21/2015  . Family hx of prostate cancer 06/22/2013  . Allergic conjunctivitis 06/22/2013  . Allergic rhinitis 06/22/2013  . OSA (obstructive sleep apnea) 10/06/2012  . BMI 30.0-30.9,adult 07/19/2012  . HTN (hypertension) 07/19/2012  . GERD (gastroesophageal reflux disease) 07/19/2012  . DDD (degenerative disc disease), cervical 07/19/2012  . Sinusitis, chronic 07/19/2012    No Known Allergies  Prior to Admission medications   Medication Sig Start Date End Date Taking? Authorizing Provider  Blood Glucose Monitoring Suppl (ONE TOUCH ULTRA 2) W/DEVICE KIT Use to test blood sugar daily as instructed. 01/24/15  Yes Philemon Kingdom, MD    glipiZIDE (GLUCOTROL) 10 MG tablet TAKE 1 TABLET (10 MG TOTAL) BY MOUTH 2 (TWO) TIMES DAILY BEFORE A MEAL. 10/27/16  Yes Philemon Kingdom, MD  glucose blood (ONE TOUCH ULTRA TEST) test strip Use to test blood sugar 2 times daily as instructed. 10/27/16  Yes Philemon Kingdom, MD  glucose blood (TRUETRACK TEST) test strip Use 2x a day 10/27/16  Yes Philemon Kingdom, MD  lisinopril (PRINIVIL,ZESTRIL) 10 MG tablet TAKE 1 TABLET (10 MG TOTAL) BY MOUTH DAILY 01/30/16  Yes Leandrew Koyanagi, MD  meloxicam (MOBIC) 15 MG tablet Take 15 mg by mouth daily.   Yes Historical Provider, MD  metFORMIN (GLUCOPHAGE-XR) 500 MG 24 hr tablet TAKE 2 TABLETS (1,000 MG TOTAL) BY MOUTH 2 TIMES DAILY WITH A MEAL. 10/27/16  Yes Philemon Kingdom, MD  [facility] DELICA LANCETS 69C MISC Use to test blood sugar 2 times daily as instructed. 01/24/15  Yes Philemon Kingdom, MD  sitaGLIPtin (JANUVIA) 100 MG tablet TAKE 1 TABLET (100 MG TOTAL) BY MOUTH DAILY. 10/27/16  Yes Philemon Kingdom, MD  [facility] LANCETS MISC Use 2x a day - Tru Track 06/09/14  Yes Philemon Kingdom, MD    Past Medical, Surgical Family and Social History reviewed and updated.    Objective:   Today's Vitals   12/31/16 1602  BP: (!) 170/110  Pulse: (!) 101  Resp: 17  Temp: 98 F (36.7 C)  TempSrc:  Oral  SpO2: 98%  Weight: 243 lb (110.2 kg)  Height: 6' 1.5" (1.867 m)    Wt Readings from Last 3 Encounters:  12/31/16 243 lb (110.2 kg)  10/27/16 239 lb (108.4 kg)  08/07/16 241 lb (109.3 kg)   Physical Exam  Constitutional: He is oriented to person, place, and time. He appears well-developed and well-nourished.  HENT:  Head: Normocephalic and atraumatic.  Eyes: Conjunctivae and EOM are normal. Pupils are equal, round, and reactive to light.  Cardiovascular: Normal rate.   Pulmonary/Chest: Effort normal.  Musculoskeletal: He exhibits tenderness.  Neurological: He is alert and oriented to person, place, and time.  Skin: Skin is warm and dry.   Psychiatric:  Very anxious and reinforcement needed to for patient to comply with instructions   -Left hand was prompted up on a pillow in order to perform suturing.   Procedure Note: Verbal informed consent obtained Local anesthesia used 1% without epinephrine, a total 5cc used for nerve block of 3rd digit. Cleaned with soap and water Irrigated with normal saline. No glia or body deformity  No foreign object or  debris in wound bed Closed with #5  simple interrupted 4.0 prolene sutures, cleansed and dressed.  Assessment & Plan:  1. Cut of left hand, initial encounter -Patient was unwilling to ly down while suture repair occurred. -Procedure was tolerated. -Wound care instructions provided and pt is return within 7-10 days for suture removal. -Pt was placed on prophylactic antibioitc only due to increased risk of infection as patient is a diabetic.  -Keflex 500 mg x 3 times daily      Carroll Sage. Kenton Kingfisher, MSN, FNP-C [facility]

## 2017-01-01 MED FILL — traMADol HCL 50 MG TABS: 50 | 2 days supply | Qty: 15 | Fill #0

## 2017-01-06 ENCOUNTER — Ambulatory Visit (INDEPENDENT_AMBULATORY_CARE_PROVIDER_SITE_OTHER): Payer: 59 | Admitting: Physician Assistant

## 2017-01-06 VITALS — BP 160/88 | HR 94 | Temp 98.2°F | Resp 18 | Ht 73.5 in | Wt 236.0 lb

## 2017-01-06 DIAGNOSIS — S61402D Unspecified open wound of left hand, subsequent encounter: Secondary | ICD-10-CM

## 2017-01-06 DIAGNOSIS — S61412D Laceration without foreign body of left hand, subsequent encounter: Secondary | ICD-10-CM

## 2017-01-06 DIAGNOSIS — R030 Elevated blood-pressure reading, without diagnosis of hypertension: Secondary | ICD-10-CM

## 2017-01-06 NOTE — Patient Instructions (Signed)
Return on 01/10/17 for suture removal. Thank you for letting me participate in your health and well being.

## 2017-01-06 NOTE — Progress Notes (Signed)
   Patient: Jamie Foster [id number]  Subjective: Jamie Foster is returning for suture removal. Patient was initially seen on 2/71/8 and had 5 sutures placed. He was also placed on prophylactic keflex and tramadol for pain. He notes he has taken the medication as prescribed, has one tablet of keflex left. Notes he is still having tenderness with palpation of the affected area. Denies fever, drainage of pus or blood, wound dehiscence, and edema.    Objective: Physical Exam  Constitutional: He is oriented to person, place, and time and well-developed, well-nourished, and in no distress.  HENT:  Head: Normocephalic and atraumatic.  Eyes: Conjunctivae are normal.  Neck: Normal range of motion.  Pulmonary/Chest: Effort normal.  Neurological: He is alert and oriented to person, place, and time. Gait normal.  Skin: Skin is warm and dry.  Well healing laceration with sutures intact noted on 3rd digit of left hand. Wound edges do not appear fully approximated.   Psychiatric: Affect normal.  Vitals reviewed.  Assessment and Plan: 1. Cut of left hand, subsequent encounter It does not appear that wound is fully healed. Recommend sutures remain in tact for another 4 days. Follow up on 01/10/17 for suture removal. Wound dressed.   2. Elevated blood pressure reading Recheck at next visit  Benjiman CoreBrittany Jiovanni Heeter, PA-C  [facility] 01/06/2017 10:37 AM

## 2017-01-08 ENCOUNTER — Other Ambulatory Visit: Payer: Self-pay | Admitting: Family Medicine

## 2017-01-08 ENCOUNTER — Telehealth: Payer: Self-pay

## 2017-01-08 NOTE — Telephone Encounter (Signed)
Patient wants another round of antibiotic for his cut and he also wants something for the pain.  He stated he uses the med center in [place].  His call back number is [phone]

## 2017-01-09 ENCOUNTER — Other Ambulatory Visit: Payer: Self-pay | Admitting: Family Medicine

## 2017-01-09 ENCOUNTER — Telehealth: Payer: Self-pay

## 2017-01-09 NOTE — Telephone Encounter (Signed)
Pt needs a call back as to why he could not get more antibodics  Best [phone]

## 2017-01-09 NOTE — Telephone Encounter (Signed)
He is due for a suture removal tomorrow

## 2017-01-09 NOTE — Telephone Encounter (Signed)
He is due for recheck/suture removal tomorrow , can come in today if concerns of pain or more antibiotic

## 2017-01-09 NOTE — Telephone Encounter (Signed)
12/31/16 last refill 

## 2017-01-09 NOTE — Telephone Encounter (Signed)
Please call pt. He will be evaluated by NP Tiburcio PeaHarris tomorrow. If he warrants further antibiotic, she will prescribe it then. Also, instruct him that he can use ibuprofen or tylenol OTC for pain as anything stronger is not recommended for this injury. Thanks!

## 2017-01-10 ENCOUNTER — Ambulatory Visit (INDEPENDENT_AMBULATORY_CARE_PROVIDER_SITE_OTHER): Payer: 59 | Admitting: Family Medicine

## 2017-01-10 DIAGNOSIS — Z4802 Encounter for removal of sutures: Secondary | ICD-10-CM

## 2017-01-10 NOTE — Telephone Encounter (Signed)
L/m with providers message and need for suture removal today

## 2017-01-10 NOTE — Progress Notes (Signed)
Suture Removal Only:

## 2017-01-10 NOTE — Patient Instructions (Signed)
     IF you received an x-ray today, you will receive an invoice from [facility]. Please contact [facility] at [phone] with questions or concerns regarding your invoice.   IF you received labwork today, you will receive an invoice from [facility]. Please contact [facility] at [phone] with questions or concerns regarding your invoice.   Our billing staff will not be able to assist you with questions regarding bills from these companies.  You will be contacted with the lab results as soon as they are available. The fastest way to get your results is to activate your My Chart account. Instructions are located on the last page of this paperwork. If you have not heard from us regarding the results in 2 weeks, please contact this office.     

## 2017-01-16 ENCOUNTER — Ambulatory Visit: Payer: 59 | Admitting: Internal Medicine

## 2017-01-16 DIAGNOSIS — Z0289 Encounter for other administrative examinations: Secondary | ICD-10-CM

## 2017-01-29 ENCOUNTER — Encounter: Payer: Self-pay | Admitting: Family Medicine

## 2017-01-29 ENCOUNTER — Ambulatory Visit (INDEPENDENT_AMBULATORY_CARE_PROVIDER_SITE_OTHER): Payer: 59 | Admitting: Family Medicine

## 2017-01-29 VITALS — BP 152/94 | HR 90 | Temp 97.9°F | Resp 16 | Ht 73.5 in | Wt 236.4 lb

## 2017-01-29 DIAGNOSIS — I10 Essential (primary) hypertension: Secondary | ICD-10-CM | POA: Diagnosis not present

## 2017-01-29 DIAGNOSIS — Z0000 Encounter for general adult medical examination without abnormal findings: Secondary | ICD-10-CM

## 2017-01-29 DIAGNOSIS — R0789 Other chest pain: Secondary | ICD-10-CM | POA: Diagnosis not present

## 2017-01-29 DIAGNOSIS — M5030 Other cervical disc degeneration, unspecified cervical region: Secondary | ICD-10-CM

## 2017-01-29 DIAGNOSIS — Z13220 Encounter for screening for lipoid disorders: Secondary | ICD-10-CM | POA: Diagnosis not present

## 2017-01-29 DIAGNOSIS — H9202 Otalgia, left ear: Secondary | ICD-10-CM | POA: Diagnosis not present

## 2017-01-29 DIAGNOSIS — Z125 Encounter for screening for malignant neoplasm of prostate: Secondary | ICD-10-CM | POA: Diagnosis not present

## 2017-01-29 DIAGNOSIS — E11319 Type 2 diabetes mellitus with unspecified diabetic retinopathy without macular edema: Secondary | ICD-10-CM | POA: Diagnosis not present

## 2017-01-29 LAB — POCT URINALYSIS DIP (MANUAL ENTRY)
Bilirubin, UA: NEGATIVE
Blood, UA: NEGATIVE
Glucose, UA: NEGATIVE
Ketones, POC UA: NEGATIVE
Leukocytes, UA: NEGATIVE
Nitrite, UA: NEGATIVE
Protein Ur, POC: NEGATIVE
Spec Grav, UA: <=1.005
Urobilinogen, UA: 0.2
pH, UA: 5.5

## 2017-01-29 MED ORDER — MELOXICAM 7.5 MG PO TABS: 7.5000 mg | ORAL_TABLET | Freq: Every day | ORAL | 0 refills | Status: DC | PRN

## 2017-01-29 MED ORDER — LISINOPRIL 10 MG PO TABS: ORAL_TABLET | ORAL | 1 refills | Status: DC

## 2017-01-29 MED FILL — MELOXICAM 7.5 MG TABLET: 7.5 | 30 days supply | Qty: 30 | Fill #0

## 2017-01-29 MED FILL — LISINOPRIL 10 MG TABLET: 10 | 90 days supply | Qty: 90 | Fill #0

## 2017-01-29 NOTE — Progress Notes (Signed)
By signing my name below, I, Mesha Guinyard, attest that this documentation has been prepared under the direction and in the presence of Merri Ray, MD.  Electronically Signed: Verlee Monte, Medical Scribe. 01/29/17. 11:50 AM.  Subjective:    Patient ID: Jamie Foster, male    DOB: [birth year], 51 y.o.   MRN: [id number]  HPI Chief Complaint  Patient presents with  . Annual Exam    HPI Comments: Jamie Foster is a 51 y.o. male who presents to the [facility] for his annual complete physical. He is a new pt to me. He also has a history of DM, HTN, GERD, cervical DDD, allergic rhinitis, and FHx of prostate CA with father at 73.  HTN: Takes lisinopril 10 mg QD. Lab Results  Component Value Date   CREATININE 1.16 12/21/2015   BP Readings from Last 3 Encounters:  01/29/17 (!) 152/94  01/06/17 (!) 160/88  12/31/16 (!) 170/110   T2DM: He is followed by Dr. Lilia Argue for DM. Last A1c was uncontrolled and in the 10 range with plan on follow-up with glucose log in 2 months with endocrine - he does have diabetic retinopathy. He would like to get a second opinion with another endocrinologist. Lab Results  Component Value Date   HGBA1C 10.3 10/27/2016   Lab Results  Component Value Date   MICROALBUR 3.8 01/30/2016   Ear Pain: Reports intermittent sharp left ear pain that can occur with 6 months intervals without pain. Pt was seen for his sxs in the past and was given ear drops and took it on a regimen when initially rx to him. He now akes it 1 drop for relief of his sxs after initially taking it continuously when it was origionally rx to him. Denies current ear pain.  Arthralgias: Reports right elbow pain and finger pain for the past 3 months.  Neck Pain: Reports sharp pain that could last a week for some time. NKI, no new sx's. Takes meloxicam PRN, and can go a whole month until he takes it he's been out for a while.  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 Reports a non radiating warm sensation in his right sided chest with exertion since Dec. 2017. He initially noticed it when he was dancing at a party, no alcohol was involved at that time. He finds relief in a few minutes with rest. Pt had a stress test a long time ago. Denies history of MI, chest pain, cough, SOB, nausea, and diaphoresis.  Cancer Screening: Colon CA: Colonoscopy done Aug 2017 by Dr. Ardis Hughs. Prostate CA: FHx of prostate Ca as above. Lab Results  Component Value Date   PSA 0.72 08/30/2014   PSA 0.69 06/22/2013   PSA 0.57 12/22/2012   Immunizations: Pt doesn't take flu shot and deferred today. Immunization History  Administered Date(s) Administered  . Pneumococcal Conjugate-13 01/30/2016  . Tdap 01/30/2016   Vision: Hx of diabetic retinopathy. He was advised to schedule another eye exam at his last endocrinologist appt. Pt's last ophthalmologist appt was in 2016. No exam data present  Dentist: Pt is not followed by a dentist and hasn't been seen recently.  Exercise: as above - sometimes exercises with coaching soccer.   Depression Screening: Depression screen [facility] 2/9 01/29/2017 01/29/2017 01/29/2017 12/31/2016 01/30/2016  Decreased Interest 0 0 0 0 0  Down, Depressed, Hopeless 0 0 0 0 0  PHQ - 2 Score 0 0 0 0 0  Patient Active Problem List   Diagnosis Date Noted  . Uncontrolled type 2 diabetes mellitus with retinopathy, without long-term current use of insulin ([place]) 12/21/2015  . Family hx of prostate cancer 06/22/2013  . Allergic conjunctivitis 06/22/2013  . Allergic rhinitis 06/22/2013  . OSA (obstructive sleep apnea) 10/06/2012  . BMI 30.0-30.9,adult 07/19/2012  . HTN (hypertension) 07/19/2012  . GERD (gastroesophageal reflux disease) 07/19/2012  . DDD (degenerative disc disease), cervical 07/19/2012  . Sinusitis, chronic 07/19/2012   Past Medical History:  Diagnosis Date  . Allergy   . Diabetes mellitus without complication ([place])   . H pylori  ulcer   . Hyperlipidemia   . Hypertension   . Kidney stone   . Sleep apnea    Past Surgical History:  Procedure Laterality Date  . NASAL SINUS SURGERY     No Known Allergies Prior to Admission medications   Medication Sig Start Date End Date Taking? Authorizing Provider  Blood Glucose Monitoring Suppl (ONE TOUCH ULTRA 2) W/DEVICE KIT Use to test blood sugar daily as instructed. 01/24/15  Yes Philemon Kingdom, MD  glipiZIDE (GLUCOTROL) 10 MG tablet TAKE 1 TABLET (10 MG TOTAL) BY MOUTH 2 (TWO) TIMES DAILY BEFORE A MEAL. 10/27/16  Yes Philemon Kingdom, MD  glucose blood (ONE TOUCH ULTRA TEST) test strip Use to test blood sugar 2 times daily as instructed. 10/27/16  Yes Philemon Kingdom, MD  glucose blood (TRUETRACK TEST) test strip Use 2x a day 10/27/16  Yes Philemon Kingdom, MD  lisinopril (PRINIVIL,ZESTRIL) 10 MG tablet TAKE 1 TABLET (10 MG TOTAL) BY MOUTH DAILY 01/30/16  Yes Leandrew Koyanagi, MD  meloxicam (MOBIC) 15 MG tablet Take 15 mg by mouth daily.   Yes Historical Provider, MD  metFORMIN (GLUCOPHAGE-XR) 500 MG 24 hr tablet TAKE 2 TABLETS (1,000 MG TOTAL) BY MOUTH 2 TIMES DAILY WITH A MEAL. 10/27/16  Yes Philemon Kingdom, MD  [facility] DELICA LANCETS 00Q MISC Use to test blood sugar 2 times daily as instructed. 01/24/15  Yes Philemon Kingdom, MD  sitaGLIPtin (JANUVIA) 100 MG tablet TAKE 1 TABLET (100 MG TOTAL) BY MOUTH DAILY. 10/27/16  Yes Philemon Kingdom, MD  [facility] LANCETS MISC Use 2x a day - Tru Track 06/09/14  Yes Philemon Kingdom, MD  cephALEXin (KEFLEX) 500 MG capsule Take 1 capsule (500 mg total) by mouth 3 (three) times daily. Patient not taking: Reported on 01/29/2017 12/31/16   Sedalia Muta, FNP   Social History   Social History  . Marital status: Married    Spouse name: N/A  . Number of children: N/A  . Years of education: N/A   Occupational History  . Not on file.   Social History Main Topics  . Smoking status: Never Smoker  . Smokeless tobacco: Never Used    . Alcohol use No  . Drug use: No  . Sexual activity: Yes   Other Topics Concern  . Not on file   Social History Narrative   Married.Education: College/Other. Exercise: Walks.   Review of Systems  HENT: Positive for ear pain (intermittent left side).   Musculoskeletal: Positive for arthralgias and neck pain.  13 point ROS positive for the above Objective:  Physical Exam  Constitutional: He is oriented to person, place, and time. He appears well-developed and well-nourished.  HENT:  Head: Normocephalic and atraumatic.  Right Ear: External ear normal.  Left Ear: External ear normal.  Mouth/Throat: Oropharynx is clear and moist.  Eyes: Conjunctivae and EOM are normal. Pupils are equal, round, and reactive  to light.  Neck: Normal range of motion. Neck supple. No thyromegaly present.  Slight decreased flexion Decreased extension  Pain with left rotation Decreased rotation bilaterally  Cardiovascular: Normal rate, regular rhythm, normal heart sounds and intact distal pulses.   Pulmonary/Chest: Effort normal and breath sounds normal. No respiratory distress. He has no wheezes.  Abdominal: Soft. He exhibits no distension. There is no tenderness. Hernia confirmed negative in the right inguinal area and confirmed negative in the left inguinal area.  Genitourinary: Prostate normal.  Musculoskeletal: Normal range of motion. He exhibits no edema or tenderness.  Lymphadenopathy:    He has no cervical adenopathy.  Neurological: He is alert and oriented to person, place, and time. He has normal reflexes.  Skin: Skin is warm and dry.  Psychiatric: He has a normal mood and affect. His behavior is normal.  Vitals reviewed.  Vitals:   01/29/17 1104  BP: (!) 152/94  Pulse: 90  Resp: 16  Temp: 97.9 F (36.6 C)  TempSrc: Oral  SpO2: 99%  Weight: 236 lb 6.4 oz (107.2 kg)  Height: 6' 1.5" (1.867 m)  Body mass index is 30.77 kg/m.   [EKG Reading]: Sinus rhythm, rate of 86. No acute  findings or apparent changes from 2014. Assessment & Plan:   INRI SOBIESKI is a 51 y.o. male Annual physical exam  - -anticipatory guidance as below in AVS, screening labs above. Health maintenance items as above in HPI discussed/recommended as applicable.   Chest discomfort - Plan: EKG 12-Lead, Ambulatory referral to Cardiology, CANCELED: EKG 12-Lead  - Recheck risk factors of diabetes, uncontrolled recently, hypertension, age, hyperlipidemia. Refer to cardiology. ER/911 chest pain precautions reviewed, and avoid exertional exercise until cleared by cardiology.  Left ear pain  - Asymptomatic currently, advised to follow-up when he does have symptoms to determine exact cause and then can decide on treatment. He can send him a copy or let us know what drops his use in the past, but again would want to see him at time of symptoms  Essential hypertension - Plan: POCT urinalysis dipstick, Comprehensive metabolic panel, lisinopril (PRINIVIL,ZESTRIL) 10 MG tablet  - Borderline, will continue lisinopril same dose for now. Can monitor out of office and will follow-up with cardiology.  Screening for hyperlipidemia - Plan: Comprehensive metabolic panel, Lipid panel  DDD (degenerative disc disease), cervical - Plan: meloxicam (MOBIC) 7.5 MG tablet  - Long-standing neck issues. DDD noted previously. Wrote for lower dose of meloxicam 7.5 mg on when necessary use only as cardiac risks discussed, especially with long-term use. Follow-up to discuss neck pain and plan further.  Screening for prostate cancer - Plan: PSA  - We discussed pros and cons of prostate cancer screening, and after this discussion, he chose to have screening done. PSA obtained, and no concerning findings on DRE.   Type 2 diabetes mellitus with retinopathy, without long-term current use of insulin, macular edema presence unspecified, unspecified laterality, unspecified retinopathy severity ([place]) - Plan: Ambulatory referral to  Endocrinology  - Due for follow-up with his endocrinologist, but at his request I did place a referral to another endocrinologist for second opinion.  Advised to follow-up to discuss elbow pain, finger pain at future visit due to other concerns addressed above along with physical during time we had today, and non urgent nature of those symptoms.   Meds ordered this encounter  Medications  . lisinopril (PRINIVIL,ZESTRIL) 10 MG tablet    Sig: TAKE 1 TABLET (10 MG TOTAL) BY MOUTH DAILY  Dispense:  90 tablet    Refill:  1  . meloxicam (MOBIC) 7.5 MG tablet    Sig: Take 1 tablet (7.5 mg total) by mouth daily as needed for pain.    Dispense:  30 tablet    Refill:  0   Patient Instructions   Call or send email about the ear drops, but I would like to examine your ear when you are having that pain to make sure we are treating the right thing.   I can write a meloxicam prescription only to use if needed short term (heart disease risks with that medication). Please follow-up to discuss neck pain and plan further if requiring that medicine frequently.  I will refer you to a cardiologist for your chest symptoms, but if you have any worse chest pains or worsening symptoms, go to the emergency room or call 911 if needed.   Please follow-up to discuss your right arm pain.   I will refer you to another endocrinologist for a second opinion as requested, but you are due for diabetes follow up with Dr. Cruzita Lederer as well.    Keeping you healthy  Get these tests  Blood pressure- Have your blood pressure checked once a year by your healthcare provider.  Normal blood pressure is 120/80  Weight- Have your body mass index (BMI) calculated to screen for obesity.  BMI is a measure of body fat based on height and weight. You can also calculate your own BMI at [email or web address].  Cholesterol- Have your cholesterol checked every year.  Diabetes- Have your blood sugar checked regularly if you have  high blood pressure, high cholesterol, have a family history of diabetes or if you are overweight.  Screening for Colon Cancer- Colonoscopy starting at age 39.  Screening may begin sooner depending on your family history and other health conditions. Follow up colonoscopy as directed by your Gastroenterologist.  Screening for Prostate Cancer- Both blood work (PSA) and a rectal exam help screen for Prostate Cancer.  Screening begins at age 77 with African-American men and at age 65 with Caucasian men.  Screening may begin sooner depending on your family history.  Take these medicines  Aspirin- One aspirin daily can help prevent Heart disease and Stroke.  Flu shot- Every fall.  Tetanus- Every 10 years.  Zostavax- Once after the age of 70 to prevent Shingles.  Pneumonia shot- Once after the age of 12; if you are younger than 64, ask your healthcare provider if you need a Pneumonia shot.  Take these steps  Don't smoke- If you do smoke, talk to your doctor about quitting.  For tips on how to quit, go to www.smokefree.gov or call 1-800-QUIT-NOW.  Be physically active- Exercise 5 days a week for at least 30 minutes.  If you are not already physically active start slow and gradually work up to 30 minutes of moderate physical activity.  Examples of moderate activity include walking briskly, mowing the yard, dancing, swimming, bicycling, etc.  Eat a healthy diet- Eat a variety of healthy food such as fruits, vegetables, low fat milk, low fat cheese, yogurt, lean meant, poultry, fish, beans, tofu, etc. For more information go to www.thenutritionsource.org  Drink alcohol in moderation- Limit alcohol intake to less than two drinks a day. Never drink and drive.  Dentist- Brush and floss twice daily; visit your dentist twice a year.  Depression- Your emotional health is as important as your physical health. If you're feeling down, or losing interest in things you would  normally enjoy please talk to  your healthcare provider.  Eye exam- Visit your eye doctor every year.  Safe sex- If you may be exposed to a sexually transmitted infection, use a condom.  Seat belts- Seat belts can save your life; always wear one.  Smoke/Carbon Monoxide detectors- These detectors need to be installed on the appropriate level of your home.  Replace batteries at least once a year.  Skin cancer- When out in the sun, cover up and use sunscreen 15 SPF or higher.  Violence- If anyone is threatening you, please tell your healthcare provider.  Living Will/ Health care power of attorney- Speak with your healthcare provider and family.  IF you received an x-ray today, you will receive an invoice from [facility]. Please contact [facility] at [phone] with questions or concerns regarding your invoice.   IF you received labwork today, you will receive an invoice from [place] [facility]. Please contact [facility] at [phone] with questions or concerns regarding your invoice.   Our billing staff will not be able to assist you with questions regarding bills from these companies.  You will be contacted with the lab results as soon as they are available. The fastest way to get your results is to activate your My Chart account. Instructions are located on the last page of this paperwork. If you have not heard from [place] regarding the results in 2 weeks, please contact this office.    I personally performed the services described in this documentation, which was scribed in my presence. The recorded information has been reviewed and considered for accuracy and completeness, addended by me as needed, and agree with information above.  Signed,   Merri Ray, MD [facility] [place].  01/29/17 1:55 PM

## 2017-01-29 NOTE — Patient Instructions (Addendum)
Call or send email about the ear drops, but I would like to examine your ear when you are having that pain to make sure we are treating the right thing.   I can write a meloxicam prescription only to use if needed short term (heart disease risks with that medication). Please follow-up to discuss neck pain and plan further if requiring that medicine frequently.  I will refer you to a cardiologist for your chest symptoms, but if you have any worse chest pains or worsening symptoms, go to the emergency room or call 911 if needed.   Please follow-up to discuss your right arm pain.   I will refer you to another endocrinologist for a second opinion as requested, but you are due for diabetes follow up with Dr. Elvera Lennox as well.    Keeping you healthy  Get these tests  Blood pressure- Have your blood pressure checked once a year by your healthcare provider.  Normal blood pressure is 120/80  Weight- Have your body mass index (BMI) calculated to screen for obesity.  BMI is a measure of body fat based on height and weight. You can also calculate your own BMI at [email or web address].  Cholesterol- Have your cholesterol checked every year.  Diabetes- Have your blood sugar checked regularly if you have high blood pressure, high cholesterol, have a family history of diabetes or if you are overweight.  Screening for Colon Cancer- Colonoscopy starting at age 57.  Screening may begin sooner depending on your family history and other health conditions. Follow up colonoscopy as directed by your Gastroenterologist.  Screening for Prostate Cancer- Both blood work (PSA) and a rectal exam help screen for Prostate Cancer.  Screening begins at age 28 with African-American men and at age 40 with Caucasian men.  Screening may begin sooner depending on your family history.  Take these medicines  Aspirin- One aspirin daily can help prevent Heart disease and Stroke.  Flu shot- Every fall.  Tetanus- Every 10  years.  Zostavax- Once after the age of 6 to prevent Shingles.  Pneumonia shot- Once after the age of 55; if you are younger than 49, ask your healthcare provider if you need a Pneumonia shot.  Take these steps  Don't smoke- If you do smoke, talk to your doctor about quitting.  For tips on how to quit, go to www.smokefree.gov or call 1-800-QUIT-NOW.  Be physically active- Exercise 5 days a week for at least 30 minutes.  If you are not already physically active start slow and gradually work up to 30 minutes of moderate physical activity.  Examples of moderate activity include walking briskly, mowing the yard, dancing, swimming, bicycling, etc.  Eat a healthy diet- Eat a variety of healthy food such as fruits, vegetables, low fat milk, low fat cheese, yogurt, lean meant, poultry, fish, beans, tofu, etc. For more information go to www.thenutritionsource.org  Drink alcohol in moderation- Limit alcohol intake to less than two drinks a day. Never drink and drive.  Dentist- Brush and floss twice daily; visit your dentist twice a year.  Depression- Your emotional health is as important as your physical health. If you're feeling down, or losing interest in things you would normally enjoy please talk to your healthcare provider.  Eye exam- Visit your eye doctor every year.  Safe sex- If you may be exposed to a sexually transmitted infection, use a condom.  Seat belts- Seat belts can save your life; always wear one.  Smoke/Carbon Monoxide detectors- These detectors need to  be installed on the appropriate level of your home.  Replace batteries at least once a year.  Skin cancer- When out in the sun, cover up and use sunscreen 15 SPF or higher.  Violence- If anyone is threatening you, please tell your healthcare provider.  Living Will/ Health care power of attorney- Speak with your healthcare provider and family.  IF you received an x-ray today, you will receive an invoice from [facility]. Please contact [facility] at [phone] with questions or concerns regarding your invoice.   IF you received labwork today, you will receive an invoice from [facility]. Please contact [facility] at [phone] with questions or concerns regarding your invoice.   Our billing staff will not be able to assist you with questions regarding bills from these companies.  You will be contacted with the lab results as soon as they are available. The fastest way to get your results is to activate your My Chart account. Instructions are located on the last page of this paperwork. If you have not heard from us regarding the results in 2 weeks, please contact this office.

## 2017-01-30 LAB — LIPID PANEL
Chol/HDL Ratio: 3.5 ratio units (ref 0.0–5.0)
Cholesterol, Total: 170 mg/dL (ref 100–199)
HDL: 48 mg/dL (ref >39)
LDL Calculated: 98 mg/dL (ref 0–99)
Triglycerides: 118 mg/dL (ref 0–149)
VLDL Cholesterol Cal: 24 mg/dL (ref 5–40)

## 2017-01-30 LAB — COMPREHENSIVE METABOLIC PANEL
ALT: 23 IU/L (ref 0–44)
AST: 18 IU/L (ref 0–40)
Albumin/Globulin Ratio: 1.6 (ref 1.2–2.2)
Albumin: 4.6 g/dL (ref 3.5–5.5)
Alkaline Phosphatase: 50 IU/L (ref 39–117)
BUN/Creatinine Ratio: 13 (ref 9–20)
BUN: 14 mg/dL (ref 6–24)
Bilirubin Total: 0.3 mg/dL (ref 0.0–1.2)
CO2: 28 mmol/L (ref 18–29)
Calcium: 9.7 mg/dL (ref 8.7–10.2)
Chloride: 97 mmol/L (ref 96–106)
Creatinine, Ser: 1.05 mg/dL (ref 0.76–1.27)
GFR calc Af Amer: 94 mL/min/{1.73_m2} (ref >59)
GFR calc non Af Amer: 81 mL/min/{1.73_m2} (ref >59)
Globulin, Total: 2.9 g/dL (ref 1.5–4.5)
Glucose: 133 mg/dL — ABNORMAL HIGH (ref 65–99)
Potassium: 4.3 mmol/L (ref 3.5–5.2)
Sodium: 139 mmol/L (ref 134–144)
Total Protein: 7.5 g/dL (ref 6.0–8.5)

## 2017-01-30 LAB — PSA: Prostate Specific Ag, Serum: 0.8 ng/mL (ref 0.0–4.0)

## 2017-02-26 ENCOUNTER — Encounter: Payer: 59 | Admitting: Family Medicine

## 2017-03-02 ENCOUNTER — Encounter: Payer: Self-pay | Admitting: Cardiology

## 2017-03-18 ENCOUNTER — Encounter (INDEPENDENT_AMBULATORY_CARE_PROVIDER_SITE_OTHER): Payer: Self-pay

## 2017-03-18 ENCOUNTER — Ambulatory Visit (INDEPENDENT_AMBULATORY_CARE_PROVIDER_SITE_OTHER): Payer: 59 | Admitting: Cardiology

## 2017-03-18 ENCOUNTER — Encounter: Payer: Self-pay | Admitting: Cardiology

## 2017-03-18 VITALS — BP 130/90 | HR 80 | Ht 75.0 in | Wt 234.0 lb

## 2017-03-18 DIAGNOSIS — R079 Chest pain, unspecified: Secondary | ICD-10-CM | POA: Diagnosis not present

## 2017-03-18 NOTE — Patient Instructions (Addendum)
Medication Instructions:  Your physician recommends that you continue on your current medications as directed. Please refer to the Current Medication list given to you today.   Please contact your prescribing MD for Lisinopril refills.  Labwork: None  Testing/Procedures: Your physician has requested that you have an exercise tolerance test. For further information please visit [email or web address] Please also follow instruction sheet, as given.  Follow-Up: Your physician recommends that you schedule a follow-up appointment AS NEEDED with Dr. Mayford Knife pending study results.   Any Other Special Instructions Will Be Listed Below (If Applicable).     If you need a refill on your cardiac medications before your next appointment, please call your pharmacy.

## 2017-03-18 NOTE — Progress Notes (Signed)
Cardiology Office Note    Date:  03/18/2017   ID:  MALICHI PALARDY, DOB 1965/12/19, MRN [id number]  PCP: Shade Flood, MD  Cardiologist:  Armanda Magic, MD   Chief Complaint  Patient presents with  . Chest Pain    History of Present Illness:  Jamie Foster is a 51 y.o. male with a history of DM, hyperlipidemia and HTN who is referred here today by his PCP, Dr. Meredith Staggers, for evaluation of chest pain.  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 He says that this occurs every time he plays soccer.  He says that this has occurred for years even when he was playing soccer.  He will also notice SOB but no nausea or diaphoresis.  He says that this also occurs with anything that is cardiovascular.  He denies any palpitations but does get dizziness when going from bending over to standing up.  He denies any LE edema, claudication or syncope.  He denies any significant tobacco use except sampling as a child.  He has no family history of CAD.    Past Medical History:  Diagnosis Date  . Allergy   . Diabetes mellitus without complication (HCC)   . H pylori ulcer   . Hyperlipidemia   . Hypertension   . Kidney stone   . Sleep apnea     Past Surgical History:  Procedure Laterality Date  . NASAL SINUS SURGERY      Current Medications: Current Meds  Medication Sig  . glipiZIDE (GLUCOTROL) 10 MG tablet TAKE 1 TABLET (10 MG TOTAL) BY MOUTH 2 (TWO) TIMES DAILY BEFORE A MEAL.  Marland Kitchen lisinopril (PRINIVIL,ZESTRIL) 10 MG tablet TAKE 1 TABLET (10 MG TOTAL) BY MOUTH DAILY  . metFORMIN (GLUCOPHAGE-XR) 500 MG 24 hr tablet TAKE 2 TABLETS (1,000 MG TOTAL) BY MOUTH 2 TIMES DAILY WITH A MEAL.  Marland Kitchen sitaGLIPtin (JANUVIA) 100 MG tablet TAKE 1 TABLET (100 MG TOTAL) BY MOUTH DAILY.   Current Facility-Administered Medications for the 03/18/17 encounter (Office Visit) with Quintella Reichert, MD    Medication  . 0.9 %  sodium chloride infusion    Allergies:   Patient has no known allergies.   Social History   Social History  . Marital status: Married    Spouse name: N/A  . Number of children: N/A  . Years of education: N/A   Social History Main Topics  . Smoking status: Never Smoker  . Smokeless tobacco: Never Used  . Alcohol use No  . Drug use: No  . Sexual activity: Yes   Other Topics Concern  . None   Social History Narrative   Married.Education: College/Other. Exercise: Walks.     Family History:  The patient's family history includes Hypertension in his mother; Prostate cancer in his father.   ROS:   Please see the history of present illness.    ROS All other systems reviewed and are negative.  No flowsheet data found.     PHYSICAL EXAM:   VS:  BP 130/90   Pulse 80   Ht  (1.905 m)   Wt 234 lb (106.1 kg)   BMI 29.25 kg/m    GEN: Well nourished, well developed, in no acute distress  HEENT: normal  Neck: no JVD, carotid bruits, or masses Cardiac: RRR; no murmurs, rubs, or gallops,no edema.  Intact distal pulses  bilaterally.  Respiratory:  clear to auscultation bilaterally, normal work of breathing GI: soft, nontender, nondistended, + BS MS: no deformity or atrophy  Skin: warm and dry, no rash Neuro:  Alert and Oriented x 3, Strength and sensation are intact Psych: euthymic mood, full affect  Wt Readings from Last 3 Encounters:  03/18/17 234 lb (106.1 kg)  01/29/17 236 lb 6.4 oz (107.2 kg)  01/06/17 236 lb (107 kg)      Studies/Labs Reviewed:   EKG:  EKG is not ordered today.    Recent Labs: 01/29/2017: ALT 23; BUN 14; Creatinine, Ser 1.05; Potassium 4.3; Sodium 139   Lipid Panel    Component Value Date/Time   CHOL 170 01/29/2017 1344   TRIG 118 01/29/2017 1344   HDL 48 01/29/2017 1344   CHOLHDL 3.5 01/29/2017 1344   CHOLHDL 4 12/21/2015 1028   VLDL 17.0 12/21/2015 1028   LDLCALC 98 01/29/2017 1344    Additional studies/  records that were reviewed today include:  OV note from PCP    ASSESSMENT:    1. Chest pain, unspecified type      PLAN:  In order of problems listed above:  1. Chest pain that is very atypical. It is right sided and feels like a hot flash and associated with SOB but no diaphoresis or nausea.  There is no radiation of the discomfort.  Of concern is that it is exertional and resolves with rest.  Any aerobic activity triggers it.  It has been present, though, for years and even when he was playing soccer.  His EKG is nonischemic.  He has risk factors for CAD including DM, hyperlipidemia and HTN.  I suspect this could be exercise induced asthma but with risk factors need to rule out CAD.  I will set him up for an ETT to rule out ischemia.     Medication Adjustments/Labs and Tests Ordered: Current medicines are reviewed at length with the patient today.  Concerns regarding medicines are outlined above.  Medication changes, Labs and Tests ordered today are listed in the Patient Instructions below.  Patient Instructions  Medication Instructions:  Your physician recommends that you continue on your current medications as directed. Please refer to the Current Medication list given to you today.   Please contact your prescribing MD for Lisinopril refills.  Labwork: None  Testing/Procedures: Your physician has requested that you have an exercise tolerance test. For further information please visit [email or web address] Please also follow instruction sheet, as given.  Follow-Up: Your physician recommends that you schedule a follow-up appointment AS NEEDED with Dr. Mayford Knife pending study results.   Any Other Special Instructions Will Be Listed Below (If Applicable).     If you need a refill on your cardiac medications before your next appointment, please call your pharmacy.      Signed, Armanda Magic, MD  03/18/2017 9:34 AM    [facility] [address] [place],  [place], [place]  [address] Phone: [phone]; Fax: [phone]

## 2017-03-24 ENCOUNTER — Telehealth: Payer: Self-pay

## 2017-03-24 ENCOUNTER — Ambulatory Visit (INDEPENDENT_AMBULATORY_CARE_PROVIDER_SITE_OTHER): Payer: 59

## 2017-03-24 DIAGNOSIS — I10 Essential (primary) hypertension: Secondary | ICD-10-CM

## 2017-03-24 DIAGNOSIS — R079 Chest pain, unspecified: Secondary | ICD-10-CM

## 2017-03-24 LAB — EXERCISE TOLERANCE TEST
Estimated workload: 8.5 METS
Exercise duration (min): 7 min
Exercise duration (sec): 0 s
MPHR: 168 {beats}/min
Peak HR: 146 {beats}/min
Percent HR: 86 %
Percent of predicted max HR: 86 %
RPE: 20
Rest HR: 75 {beats}/min
Stage 1 Grade: 0 %
Stage 1 HR: 86 {beats}/min
Stage 1 Speed: 0 mph
Stage 2 DBP: 88 mmHg
Stage 2 Grade: 0 %
Stage 2 HR: 88 {beats}/min
Stage 2 SBP: 143 mmHg
Stage 2 Speed: 0 mph
Stage 3 Grade: 0 %
Stage 3 HR: 86 {beats}/min
Stage 3 Speed: 1 mph
Stage 4 Grade: 0 %
Stage 4 HR: 86 {beats}/min
Stage 4 Speed: 1 mph
Stage 5 DBP: 85 mmHg
Stage 5 Grade: 10 %
Stage 5 HR: 109 {beats}/min
Stage 5 SBP: 181 mmHg
Stage 5 Speed: 1.7 mph
Stage 6 DBP: 81 mmHg
Stage 6 Grade: 12 %
Stage 6 HR: 136 {beats}/min
Stage 6 SBP: 215 mmHg
Stage 6 Speed: 2.5 mph
Stage 7 Grade: 14 %
Stage 7 HR: 146 {beats}/min
Stage 7 Speed: 3.4 mph
Stage 8 DBP: 78 mmHg
Stage 8 Grade: 0 %
Stage 8 HR: 126 {beats}/min
Stage 8 SBP: 210 mmHg
Stage 8 Speed: 0 mph
Stage 9 DBP: 88 mmHg
Stage 9 Grade: 0 %
Stage 9 HR: 93 {beats}/min
Stage 9 SBP: 145 mmHg
Stage 9 Speed: 0 mph

## 2017-03-24 NOTE — Telephone Encounter (Signed)
-----   Message from Quintella Reichert, MD sent at 03/24/2017  4:41 PM EDT ----- Please let patient know that stress test was fine but BP was elevated - please find out if he held any of his meds for the stress test.  If no then get a 24 hour BP monitor

## 2017-03-24 NOTE — Telephone Encounter (Signed)
Informed patient of results and verbal understanding expressed.  Patient takes his lisinopril at night and took it the night before. 24 hour monitor ordered for scheduling. Patient agrees with treatment plan.

## 2017-04-08 MED FILL — AMOXICILLIN 875 MG TABLET: 875 | 5 days supply | Qty: 10 | Fill #0

## 2017-04-08 MED FILL — METFORMIN HCL ER 500 MG TAB: 500 | 90 days supply | Qty: 360 | Fill #1

## 2017-04-08 MED FILL — glipiZIDE 10 MG TABS: 10 | 90 days supply | Qty: 180 | Fill #1

## 2017-04-08 MED FILL — IBUPROFEN 800 MG TAB: 800 | 8 days supply | Qty: 30 | Fill #0

## 2017-04-08 MED FILL — JANUVIA 100 MG TABLET: 100 | 90 days supply | Qty: 90 | Fill #1

## 2017-04-14 ENCOUNTER — Encounter: Payer: Self-pay | Admitting: *Deleted

## 2017-04-14 NOTE — Progress Notes (Signed)
Patient ID: Jamie Foster, male   DOB: 1966/09/25, 51 y.o.   MRN: [id number] Patient did not show up for 04/14/17, 3:30 PM, appointment, to have a 24 hour ambulatory blood pressure monitor applied.

## 2017-04-14 NOTE — Progress Notes (Signed)
Please call patient to reschedule.

## 2017-04-15 NOTE — Progress Notes (Signed)
[facility]

## 2017-04-24 NOTE — Progress Notes (Signed)
Patient has been rescheduled to 6/22

## 2017-05-01 ENCOUNTER — Encounter: Payer: Self-pay | Admitting: Endocrinology

## 2017-05-06 MED FILL — LISINOPRIL 10 MG TABLET: 10 | 90 days supply | Qty: 90 | Fill #1

## 2017-05-19 ENCOUNTER — Ambulatory Visit (INDEPENDENT_AMBULATORY_CARE_PROVIDER_SITE_OTHER): Payer: 59 | Admitting: Family Medicine

## 2017-05-19 ENCOUNTER — Encounter: Payer: Self-pay | Admitting: Family Medicine

## 2017-05-19 VITALS — BP 153/88 | HR 91 | Temp 98.3°F | Resp 16 | Ht 75.0 in | Wt 231.2 lb

## 2017-05-19 DIAGNOSIS — J0100 Acute maxillary sinusitis, unspecified: Secondary | ICD-10-CM

## 2017-05-19 DIAGNOSIS — I10 Essential (primary) hypertension: Secondary | ICD-10-CM | POA: Diagnosis not present

## 2017-05-19 MED ORDER — AMOXICILLIN-POT CLAVULANATE 875-125 MG PO TABS: 1.0000 | ORAL_TABLET | Freq: Two times a day (BID) | ORAL | 0 refills | Status: DC

## 2017-05-19 MED FILL — AMOX-CLAV 875-125 MG TABLET: 875-125 | 10 days supply | Qty: 20 | Fill #0

## 2017-05-19 NOTE — Progress Notes (Signed)
By signing my name below, I, Mesha Guinyard, attest that this documentation has been prepared under the direction and in the presence of Merri Ray, MD.  Electronically Signed: Verlee Monte, Medical Scribe. 05/19/17. 12:10 PM.  Subjective:    Patient ID: Jamie Foster, male    DOB: 09/10/1966, 51 y.o.   MRN: [id number]  HPI Chief Complaint  Patient presents with  . Sinusitis    with pressure; R side of face    HPI Comments: Jamie Foster is a 51 y.o. male who presents to Primary Care at [facility] complaining of congestion for about a month but sinus pain located on the right side of his face started 5 days ago. He was last seen in March 2018 for his annual physical. BP was elevated at 152/94. He was in lisinopril 10 mg QD. Recommended cardiology follow-up and check bp outside of office.   Sinus Pressure: Reports associated sxs of HA, increasing sinus pressure, rhinorrhea, and minimal cough. Pt used advil, saline nasal wash, and vicks vaper rub with little relief of his sxs. Pt describes his sinus pain as a stabbing pain that is felt in his head. He isn't sure if he had a fever.  HTN: Pt has an appt with his cardiologist next week. Pt has not been checking his bp at home. Lab Results  Component Value Date   CREATININE 1.05 01/29/2017   BP Readings from Last 3 Encounters:  05/19/17 (!) 153/88  03/18/17 130/90  01/29/17 (!) 152/94   DM: He had been followed by Dr. Cruzita Lederer for his DM when he was last seen. Requested a second opinion. Second opinion request sent to referrals.   Patient Active Problem List   Diagnosis Date Noted  . Chest pain 03/18/2017  . Uncontrolled type 2 diabetes mellitus with retinopathy, without long-term current use of insulin ([place]) 12/21/2015  . Family hx of prostate cancer 06/22/2013  . Allergic conjunctivitis 06/22/2013  . Allergic rhinitis 06/22/2013  . OSA (obstructive sleep apnea) 10/06/2012  . BMI 30.0-30.9,adult 07/19/2012  . HTN  (hypertension) 07/19/2012  . GERD (gastroesophageal reflux disease) 07/19/2012  . DDD (degenerative disc disease), cervical 07/19/2012  . Sinusitis, chronic 07/19/2012   Past Medical History:  Diagnosis Date  . Allergy   . Diabetes mellitus without complication ([place])   . H pylori ulcer   . Hyperlipidemia   . Hypertension   . Kidney stone   . Sleep apnea    Past Surgical History:  Procedure Laterality Date  . NASAL SINUS SURGERY     No Known Allergies Prior to Admission medications   Medication Sig Start Date End Date Taking? Authorizing Provider  Blood Glucose Monitoring Suppl (ONE TOUCH ULTRA 2) W/DEVICE KIT Use to test blood sugar daily as instructed. 01/24/15  Yes Philemon Kingdom, MD  glipiZIDE (GLUCOTROL) 10 MG tablet TAKE 1 TABLET (10 MG TOTAL) BY MOUTH 2 (TWO) TIMES DAILY BEFORE A MEAL. 10/27/16  Yes Philemon Kingdom, MD  glucose blood (ONE TOUCH ULTRA TEST) test strip Use to test blood sugar 2 times daily as instructed. 10/27/16  Yes Philemon Kingdom, MD  glucose blood (TRUETRACK TEST) test strip Use 2x a day 10/27/16  Yes Philemon Kingdom, MD  lisinopril (PRINIVIL,ZESTRIL) 10 MG tablet TAKE 1 TABLET (10 MG TOTAL) BY MOUTH DAILY 01/29/17  Yes Wendie Agreste, MD  metFORMIN (GLUCOPHAGE-XR) 500 MG 24 hr tablet TAKE 2 TABLETS (1,000 MG TOTAL) BY MOUTH 2 TIMES DAILY WITH A MEAL. 10/27/16  Yes Philemon Kingdom, MD  ONETOUCH DELICA LANCETS 89F MISC Use to test blood sugar 2 times daily as instructed. 01/24/15  Yes Philemon Kingdom, MD  sitaGLIPtin (JANUVIA) 100 MG tablet TAKE 1 TABLET (100 MG TOTAL) BY MOUTH DAILY. 10/27/16  Yes Philemon Kingdom, MD  [facility] LANCETS MISC Use 2x a day - Tru Track 06/09/14  Yes Philemon Kingdom, MD  meloxicam (MOBIC) 7.5 MG tablet Take 1 tablet (7.5 mg total) by mouth daily as needed for pain. Patient not taking: Reported on 05/19/2017 01/29/17   Wendie Agreste, MD   Social History   Social History  . Marital status: Married    Spouse name: N/A    . Number of children: N/A  . Years of education: N/A   Occupational History  . Not on file.   Social History Main Topics  . Smoking status: Never Smoker  . Smokeless tobacco: Never Used  . Alcohol use No  . Drug use: No  . Sexual activity: Yes   Other Topics Concern  . Not on file   Social History Narrative   Married.Education: College/Other. Exercise: Walks.   Review of Systems  Constitutional: Negative for fever.  HENT: Positive for congestion, rhinorrhea, sinus pain and sinus pressure.   Respiratory: Positive for cough.   Neurological: Positive for headaches.   Objective:  Physical Exam  Constitutional: He is oriented to person, place, and time. He appears well-developed and well-nourished. No distress.  HENT:  Head: Normocephalic and atraumatic.  Right Ear: Tympanic membrane, external ear and ear canal normal.  Left Ear: Tympanic membrane, external ear and ear canal normal.  Nose: No rhinorrhea. Right sinus exhibits maxillary sinus tenderness and frontal sinus tenderness. Left sinus exhibits no maxillary sinus tenderness and no frontal sinus tenderness.  Mouth/Throat: Oropharynx is clear and moist and mucous membranes are normal. No oropharyngeal exudate or posterior oropharyngeal erythema.  Thick yellow discharge at the right nare Left nare is clear  Eyes: Conjunctivae are normal. Pupils are equal, round, and reactive to light.  Neck: Neck supple.  Cardiovascular: Normal rate, regular rhythm, normal heart sounds and intact distal pulses.  Exam reveals no gallop and no friction rub.   No murmur heard. Pulmonary/Chest: Effort normal and breath sounds normal. No respiratory distress. He has no wheezes. He has no rhonchi. He has no rales.  Abdominal: Soft. There is no tenderness.  Lymphadenopathy:    He has no cervical adenopathy.  No adenopathy  Neurological: He is alert and oriented to person, place, and time.  Skin: Skin is warm and dry. No rash noted.   Psychiatric: He has a normal mood and affect. His behavior is normal.  Nursing note and vitals reviewed.   Vitals:   05/19/17 1119  BP: (!) 153/88  Pulse: 91  Resp: 16  Temp: 98.3 F (36.8 C)  TempSrc: Oral  SpO2: 99%  Weight: 231 lb 3.2 oz (104.9 kg)  Height: 6' 3" (1.905 m)  Body mass index is 28.9 kg/m. Assessment & Plan:    Jamie Foster is a 51 y.o. male Acute maxillary sinusitis, recurrence not specified - Plan: amoxicillin-clavulanate (AUGMENTIN) 875-125 MG tablet  - On further discussion appears he has had persisting congestion now with secondary worsening, likely right maxillary sinusitis. Afebrile, no concerning findings on exam  -start Augmentin, potential side effects discussed. Saline nasal spray, Mucinex, RTC precautions.  Essential hypertension  - Elevated again office today, but does have some pain with face pain from sinusitis. Advised to check his blood pressure outside of office  and if remaining over 130/80 with history of diabetes would recommend additional medication. Understanding expressed.  History of diabetes, requested second opinion from separate endocrinologist, will place message to referrals  to look into that further.  Meds ordered this encounter  Medications  . amoxicillin-clavulanate (AUGMENTIN) 875-125 MG tablet    Sig: Take 1 tablet by mouth 2 (two) times daily.    Dispense:  20 tablet    Refill:  0   Patient Instructions    Keep a record of your blood pressures outside of the office and if over 130/80 - would recommend changing your medication.   I will ask the referral staff to look into the referral to different endocrinologist.  Keep follow-up with cardiology as planned.   You appear to have a sinus infection, start Augmentin 1 pill twice per day, saline nasal spray such as simply saline over-the-counter, and Mucinex over-the-counter as needed. Try to avoid decongestants as this can elevate your blood pressure.  Return to the  clinic or go to the nearest emergency room if any of your symptoms worsen or new symptoms occur.   Sinusitis, Adult Sinusitis is soreness and inflammation of your sinuses. Sinuses are hollow spaces in the bones around your face. Your sinuses are located:  Around your eyes.  In the middle of your forehead.  Behind your nose.  In your cheekbones.  Your sinuses and nasal passages are lined with a stringy fluid (mucus). Mucus normally drains out of your sinuses. When your nasal tissues become inflamed or swollen, the mucus can become trapped or blocked so air cannot flow through your sinuses. This allows bacteria, viruses, and funguses to grow, which leads to infection. Sinusitis can develop quickly and last for 7?10 days (acute) or for more than 12 weeks (chronic). Sinusitis often develops after a cold. What are the causes? This condition is caused by anything that creates swelling in the sinuses or stops mucus from draining, including:  Allergies.  Asthma.  Bacterial or viral infection.  Abnormally shaped bones between the nasal passages.  Nasal growths that contain mucus (nasal polyps).  Narrow sinus openings.  Pollutants, such as chemicals or irritants in the air.  A foreign object stuck in the nose.  A fungal infection. This is rare.  What increases the risk? The following factors may make you more likely to develop this condition:  Having allergies or asthma.  Having had a recent cold or respiratory tract infection.  Having structural deformities or blockages in your nose or sinuses.  Having a weak immune system.  Doing a lot of swimming or diving.  Overusing nasal sprays.  Smoking.  What are the signs or symptoms? The main symptoms of this condition are pain and a feeling of pressure around the affected sinuses. Other symptoms include:  Upper toothache.  Earache.  Headache.  Bad breath.  Decreased sense of smell and taste.  A cough that may get  worse at night.  Fatigue.  Fever.  Thick drainage from your nose. The drainage is often green and it may contain pus (purulent).  Stuffy nose or congestion.  Postnasal drip. This is when extra mucus collects in the throat or back of the nose.  Swelling and warmth over the affected sinuses.  Sore throat.  Sensitivity to light.  How is this diagnosed? This condition is diagnosed based on symptoms, a medical history, and a physical exam. To find out if your condition is acute or chronic, your health care provider may:  Look in  your nose for signs of nasal polyps.  Tap over the affected sinus to check for signs of infection.  View the inside of your sinuses using an imaging device that has a light attached (endoscope).  If your health care provider suspects that you have chronic sinusitis, you may also:  Be tested for allergies.  Have a sample of mucus taken from your nose (nasal culture) and checked for bacteria.  Have a mucus sample examined to see if your sinusitis is related to an allergy.  If your sinusitis does not respond to treatment and it lasts longer than 8 weeks, you may have an MRI or CT scan to check your sinuses. These scans also help to determine how severe your infection is. In rare cases, a bone biopsy may be done to rule out more serious types of fungal sinus disease. How is this treated? Treatment for sinusitis depends on the cause and whether your condition is chronic or acute. If a virus is causing your sinusitis, your symptoms will go away on their own within 10 days. You may be given medicines to relieve your symptoms, including:  Topical nasal decongestants. They shrink swollen nasal passages and let mucus drain from your sinuses.  Antihistamines. These drugs block inflammation that is triggered by allergies. This can help to ease swelling in your nose and sinuses.  Topical nasal corticosteroids. These are nasal sprays that ease inflammation and  swelling in your nose and sinuses.  Nasal saline washes. These rinses can help to get rid of thick mucus in your nose.  If your condition is caused by bacteria, you will be given an antibiotic medicine. If your condition is caused by a fungus, you will be given an antifungal medicine. Surgery may be needed to correct underlying conditions, such as narrow nasal passages. Surgery may also be needed to remove polyps. Follow these instructions at home: Medicines  Take, use, or apply over-the-counter and prescription medicines only as told by your health care provider. These may include nasal sprays.  If you were prescribed an antibiotic medicine, take it as told by your health care provider. Do not stop taking the antibiotic even if you start to feel better. Hydrate and Humidify  Drink enough water to keep your urine clear or pale yellow. Staying hydrated will help to thin your mucus.  Use a cool mist humidifier to keep the humidity level in your home above 50%.  Inhale steam for 10-15 minutes, 3-4 times a day or as told by your health care provider. You can do this in the bathroom while a hot shower is running.  Limit your exposure to cool or dry air. Rest  Rest as much as possible.  Sleep with your head raised (elevated).  Make sure to get enough sleep each night. General instructions  Apply a warm, moist washcloth to your face 3-4 times a day or as told by your health care provider. This will help with discomfort.  Wash your hands often with soap and water to reduce your exposure to viruses and other germs. If soap and water are not available, use hand sanitizer.  Do not smoke. Avoid being around people who are smoking (secondhand smoke).  Keep all follow-up visits as told by your health care provider. This is important. Contact a health care provider if:  You have a fever.  Your symptoms get worse.  Your symptoms do not improve within 10 days. Get help right away  if:  You have a severe headache.  You have persistent vomiting.  You have pain or swelling around your face or eyes.  You have vision problems.  You develop confusion.  Your neck is stiff.  You have trouble breathing. This information is not intended to replace advice given to you by your health care provider. Make sure you discuss any questions you have with your health care provider. Document Released: 11/10/2005 Document Revised: 07/06/2016 Document Reviewed: 09/05/2015 Elsevier Interactive Patient Education  2017 [employer].    IF you received an x-ray today, you will receive an invoice from [facility]. Please contact [facility] at [phone] with questions or concerns regarding your invoice.   IF you received labwork today, you will receive an invoice from [facility]. Please contact [facility] at [phone] with questions or concerns regarding your invoice.   Our billing staff will not be able to assist you with questions regarding bills from these companies.  You will be contacted with the lab results as soon as they are available. The fastest way to get your results is to activate your My Chart account. Instructions are located on the last page of this paperwork. If you have not heard from [place] regarding the results in 2 weeks, please contact this office.      I personally performed the services described in this documentation, which was scribed in my presence. The recorded information has been reviewed and considered for accuracy and completeness, addended by me as needed, and agree with information above.  Signed,   Merri Ray, MD [facility] at [place].  05/19/17 1:38 PM

## 2017-05-19 NOTE — Patient Instructions (Addendum)
Keep a record of your blood pressures outside of the office and if over 130/80 - would recommend changing your medication.   I will ask the referral staff to look into the referral to different endocrinologist.  Keep follow-up with cardiology as planned.   You appear to have a sinus infection, start Augmentin 1 pill twice per day, saline nasal spray such as simply saline over-the-counter, and Mucinex over-the-counter as needed. Try to avoid decongestants as this can elevate your blood pressure.  Return to the clinic or go to the nearest emergency room if any of your symptoms worsen or new symptoms occur.   Sinusitis, Adult Sinusitis is soreness and inflammation of your sinuses. Sinuses are hollow spaces in the bones around your face. Your sinuses are located:  Around your eyes.  In the middle of your forehead.  Behind your nose.  In your cheekbones.  Your sinuses and nasal passages are lined with a stringy fluid (mucus). Mucus normally drains out of your sinuses. When your nasal tissues become inflamed or swollen, the mucus can become trapped or blocked so air cannot flow through your sinuses. This allows bacteria, viruses, and funguses to grow, which leads to infection. Sinusitis can develop quickly and last for 7?10 days (acute) or for more than 12 weeks (chronic). Sinusitis often develops after a cold. What are the causes? This condition is caused by anything that creates swelling in the sinuses or stops mucus from draining, including:  Allergies.  Asthma.  Bacterial or viral infection.  Abnormally shaped bones between the nasal passages.  Nasal growths that contain mucus (nasal polyps).  Narrow sinus openings.  Pollutants, such as chemicals or irritants in the air.  A foreign object stuck in the nose.  A fungal infection. This is rare.  What increases the risk? The following factors may make you more likely to develop this condition:  Having allergies or  asthma.  Having had a recent cold or respiratory tract infection.  Having structural deformities or blockages in your nose or sinuses.  Having a weak immune system.  Doing a lot of swimming or diving.  Overusing nasal sprays.  Smoking.  What are the signs or symptoms? The main symptoms of this condition are pain and a feeling of pressure around the affected sinuses. Other symptoms include:  Upper toothache.  Earache.  Headache.  Bad breath.  Decreased sense of smell and taste.  A cough that may get worse at night.  Fatigue.  Fever.  Thick drainage from your nose. The drainage is often green and it may contain pus (purulent).  Stuffy nose or congestion.  Postnasal drip. This is when extra mucus collects in the throat or back of the nose.  Swelling and warmth over the affected sinuses.  Sore throat.  Sensitivity to light.  How is this diagnosed? This condition is diagnosed based on symptoms, a medical history, and a physical exam. To find out if your condition is acute or chronic, your health care provider may:  Look in your nose for signs of nasal polyps.  Tap over the affected sinus to check for signs of infection.  View the inside of your sinuses using an imaging device that has a light attached (endoscope).  If your health care provider suspects that you have chronic sinusitis, you may also:  Be tested for allergies.  Have a sample of mucus taken from your nose (nasal culture) and checked for bacteria.  Have a mucus sample examined to see if your sinusitis is  related to an allergy.  If your sinusitis does not respond to treatment and it lasts longer than 8 weeks, you may have an MRI or CT scan to check your sinuses. These scans also help to determine how severe your infection is. In rare cases, a bone biopsy may be done to rule out more serious types of fungal sinus disease. How is this treated? Treatment for sinusitis depends on the cause and  whether your condition is chronic or acute. If a virus is causing your sinusitis, your symptoms will go away on their own within 10 days. You may be given medicines to relieve your symptoms, including:  Topical nasal decongestants. They shrink swollen nasal passages and let mucus drain from your sinuses.  Antihistamines. These drugs block inflammation that is triggered by allergies. This can help to ease swelling in your nose and sinuses.  Topical nasal corticosteroids. These are nasal sprays that ease inflammation and swelling in your nose and sinuses.  Nasal saline washes. These rinses can help to get rid of thick mucus in your nose.  If your condition is caused by bacteria, you will be given an antibiotic medicine. If your condition is caused by a fungus, you will be given an antifungal medicine. Surgery may be needed to correct underlying conditions, such as narrow nasal passages. Surgery may also be needed to remove polyps. Follow these instructions at home: Medicines  Take, use, or apply over-the-counter and prescription medicines only as told by your health care provider. These may include nasal sprays.  If you were prescribed an antibiotic medicine, take it as told by your health care provider. Do not stop taking the antibiotic even if you start to feel better. Hydrate and Humidify  Drink enough water to keep your urine clear or pale yellow. Staying hydrated will help to thin your mucus.  Use a cool mist humidifier to keep the humidity level in your home above 50%.  Inhale steam for 10-15 minutes, 3-4 times a day or as told by your health care provider. You can do this in the bathroom while a hot shower is running.  Limit your exposure to cool or dry air. Rest  Rest as much as possible.  Sleep with your head raised (elevated).  Make sure to get enough sleep each night. General instructions  Apply a warm, moist washcloth to your face 3-4 times a day or as told by your  health care provider. This will help with discomfort.  Wash your hands often with soap and water to reduce your exposure to viruses and other germs. If soap and water are not available, use hand sanitizer.  Do not smoke. Avoid being around people who are smoking (secondhand smoke).  Keep all follow-up visits as told by your health care provider. This is important. Contact a health care provider if:  You have a fever.  Your symptoms get worse.  Your symptoms do not improve within 10 days. Get help right away if:  You have a severe headache.  You have persistent vomiting.  You have pain or swelling around your face or eyes.  You have vision problems.  You develop confusion.  Your neck is stiff.  You have trouble breathing. This information is not intended to replace advice given to you by your health care provider. Make sure you discuss any questions you have with your health care provider. Document Released: 11/10/2005 Document Revised: 07/06/2016 Document Reviewed: 09/05/2015 Elsevier Interactive Patient Education  2017 [employer].    IF  you received an x-ray today, you will receive an invoice from [facility] Radiology. Please contact [facility] Radiology at [phone] with questions or concerns regarding your invoice.   IF you received labwork today, you will receive an invoice from [facility]. Please contact [facility] at [phone] with questions or concerns regarding your invoice.   Our billing staff will not be able to assist you with questions regarding bills from these companies.  You will be contacted with the lab results as soon as they are available. The fastest way to get your results is to activate your My Chart account. Instructions are located on the last page of this paperwork. If you have not heard from [place] regarding the results in 2 weeks, please contact this office.

## 2017-06-02 ENCOUNTER — Telehealth: Payer: Self-pay | Admitting: Cardiology

## 2017-06-02 ENCOUNTER — Telehealth: Payer: Self-pay | Admitting: Internal Medicine

## 2017-06-02 NOTE — Telephone Encounter (Signed)
See below

## 2017-06-02 NOTE — Telephone Encounter (Signed)
Pt has no showed 2 times and arrived to late for appt 2 times. I have removed order at this time after calling patient to reschedule and no return call.

## 2017-06-02 NOTE — Telephone Encounter (Signed)
Called pt to schedule fu appt. Pt was seen Dec 2017 and did not schedule 2 month fu. Pt states he "already told us he was not coming back and to stop calling." He says he received no show fees and was not satisfied with the care and has asked his PCP to refer him elsewhere for continued care.

## 2017-06-02 NOTE — Telephone Encounter (Signed)
Wanted you to be aware.  Thank you!   

## 2017-06-02 NOTE — Telephone Encounter (Signed)
Noted  

## 2017-08-12 ENCOUNTER — Other Ambulatory Visit: Payer: Self-pay | Admitting: Family Medicine

## 2017-08-12 ENCOUNTER — Other Ambulatory Visit: Payer: Self-pay | Admitting: Internal Medicine

## 2017-08-12 ENCOUNTER — Telehealth: Payer: Self-pay | Admitting: Internal Medicine

## 2017-08-12 DIAGNOSIS — I10 Essential (primary) hypertension: Secondary | ICD-10-CM

## 2017-08-12 MED FILL — JANUVIA 100 MG TABLET: 100 | 90 days supply | Qty: 90 | Fill #0

## 2017-08-12 MED FILL — glipiZIDE 10 MG TABS: 10 | 90 days supply | Qty: 180 | Fill #0

## 2017-08-12 MED FILL — METFORMIN HCL ER 500 MG TAB: 500 | 90 days supply | Qty: 360 | Fill #0

## 2017-08-13 MED FILL — LISINOPRIL 10 MG TABLET: 10 | 30 days supply | Qty: 30 | Fill #0

## 2017-08-13 NOTE — Telephone Encounter (Signed)
Agree with temporary refill, and follow up appt. Thanks.

## 2017-08-13 NOTE — Telephone Encounter (Signed)
Refilled Lisinopril x 30 days with note to return for follow up-. Pt did not keep cardiology appt - has canceled 2-3.  BP uncontrolled. Sent to scheduling to call and get in to see Neva Seat.

## 2017-11-11 ENCOUNTER — Other Ambulatory Visit: Payer: Self-pay | Admitting: Family Medicine

## 2017-11-11 ENCOUNTER — Other Ambulatory Visit: Payer: Self-pay | Admitting: Internal Medicine

## 2017-11-11 DIAGNOSIS — I10 Essential (primary) hypertension: Secondary | ICD-10-CM

## 2017-11-11 MED FILL — METFORMIN HCL ER 500 MG TAB: 500 | 90 days supply | Qty: 360 | Fill #1

## 2017-11-11 MED FILL — JANUVIA 100 MG TABLET: 100 | 90 days supply | Qty: 90 | Fill #1

## 2017-11-11 MED FILL — glipiZIDE 10 MG TABS: 10 | 90 days supply | Qty: 180 | Fill #1

## 2017-12-16 MED FILL — CHLORHEXIDINE 0.12% RINSE: 0.12 | 30 days supply | Qty: 473 | Fill #0

## 2018-01-06 ENCOUNTER — Other Ambulatory Visit: Payer: Self-pay | Admitting: Internal Medicine

## 2018-01-25 ENCOUNTER — Telehealth: Payer: Self-pay | Admitting: Cardiology

## 2018-01-25 DIAGNOSIS — I10 Essential (primary) hypertension: Secondary | ICD-10-CM

## 2018-01-25 NOTE — Telephone Encounter (Signed)
New Message   Patient is calling about the holter monitor he was to have but he missed [place]. He would like to know if he still needs the monitor. Please call to discuss.

## 2018-01-25 NOTE — Telephone Encounter (Signed)
Ok to get 24 hour BP monitor

## 2018-01-25 NOTE — Telephone Encounter (Signed)
Returned call to patient. He is requesting an order for 24 BP monitor. Patient was scheduled for 24 bp monitor on 06/14/17, he was a no show, he stated [place]. Patient has a annual OV scheduled on 03/09/18. Informed patient that since its been so long he should wait till his OV. Patient is demanding the monitor prior to his visit because he feels like his blood pressure is elevated. Patient had no blood pressure readings and patient denied any symptoms. Advised patient to check blood pressure and call back with readings. Patient stated, "im not a doctor I do not check by BP" I asked how do you know your BP is elevated, pt states "when I reach over to touch by big toe I can feel it". Patient states he takes lisinopril 10 mg once a day. Informed patient I would send to Dr. Mayford Knifeurner for review. Patient verbalized understanding

## 2018-01-27 NOTE — Telephone Encounter (Signed)
Left message with patient that per Dr. Mayford Knifeurner okay to schedule patient for 24- hour BP monitor

## 2018-01-29 ENCOUNTER — Telehealth: Payer: Self-pay | Admitting: Cardiology

## 2018-01-29 ENCOUNTER — Other Ambulatory Visit: Payer: Self-pay | Admitting: *Deleted

## 2018-01-29 DIAGNOSIS — I10 Essential (primary) hypertension: Secondary | ICD-10-CM

## 2018-01-29 MED ORDER — LISINOPRIL 10 MG PO TABS: 10.0000 mg | ORAL_TABLET | Freq: Every day | ORAL | 1 refills | Status: DC

## 2018-01-29 MED FILL — LISINOPRIL 10 MG TABS: 10 | 30 days supply | Qty: 30 | Fill #0

## 2018-01-29 NOTE — Telephone Encounter (Signed)
Scheduled patient for 24-hour blood pressure monitor for 03/21/9. Patient in agreement with plan and thankful for the call

## 2018-01-29 NOTE — Telephone Encounter (Signed)
°*  STAT* If patient is at the pharmacy, call can be transferred to refill team.   1. Which medications need to be refilled? (please list name of each medication and dose if known)Lisinopril 10mg   2. Which pharmacy/location (including street and city if local pharmacy) is medication to be sent to?[facility]  3. Do they need a 30 day or 90 day supply? 30 day

## 2018-01-29 NOTE — Telephone Encounter (Signed)
Follow up  ° ° °Patient is returning call. Please call to discuss.  °

## 2018-02-11 ENCOUNTER — Encounter: Payer: Self-pay | Admitting: *Deleted

## 2018-02-11 ENCOUNTER — Ambulatory Visit: Payer: 59

## 2018-02-11 DIAGNOSIS — I10 Essential (primary) hypertension: Secondary | ICD-10-CM

## 2018-02-11 NOTE — Progress Notes (Signed)
Patient ID: Jamie Foster, male   DOB: 11-10-1966, 52 y.o.   MRN: [id number]  24 hour ambulatory blood pressure monitor applied to patient using standard adult cuff.

## 2018-03-03 ENCOUNTER — Encounter: Payer: Self-pay | Admitting: Cardiology

## 2018-03-09 ENCOUNTER — Encounter: Payer: Self-pay | Admitting: Cardiology

## 2018-03-09 ENCOUNTER — Ambulatory Visit: Payer: 59 | Admitting: Cardiology

## 2018-03-09 VITALS — BP 150/94 | HR 95 | Ht 75.0 in | Wt 227.0 lb

## 2018-03-09 DIAGNOSIS — R079 Chest pain, unspecified: Secondary | ICD-10-CM | POA: Diagnosis not present

## 2018-03-09 DIAGNOSIS — E11319 Type 2 diabetes mellitus with unspecified diabetic retinopathy without macular edema: Secondary | ICD-10-CM | POA: Diagnosis not present

## 2018-03-09 DIAGNOSIS — I10 Essential (primary) hypertension: Secondary | ICD-10-CM

## 2018-03-09 DIAGNOSIS — E1165 Type 2 diabetes mellitus with hyperglycemia: Secondary | ICD-10-CM

## 2018-03-09 DIAGNOSIS — IMO0002 Reserved for concepts with insufficient information to code with codable children: Secondary | ICD-10-CM

## 2018-03-09 MED ORDER — LISINOPRIL 10 MG PO TABS: 10.0000 mg | ORAL_TABLET | Freq: Every day | ORAL | 1 refills | Status: DC

## 2018-03-09 MED ORDER — METOPROLOL TARTRATE 50 MG PO TABS: 50.0000 mg | ORAL_TABLET | Freq: Once | ORAL | 0 refills | Status: DC

## 2018-03-09 MED FILL — METOPROLOL TARTRATE 50 MG T: 50 | 1 days supply | Qty: 1 | Fill #0

## 2018-03-09 MED FILL — LISINOPRIL 10 MG TABS: 10 | 30 days supply | Qty: 30 | Fill #0

## 2018-03-09 NOTE — Progress Notes (Signed)
Cardiology Office Note:    Date:  03/09/2018   ID:  Jamie Foster, DOB October 06, 1966, MRN [id number]  PCP:  Wendie Agreste, MD  Cardiologist:  No primary care provider on file.    Referring MD: Wendie Agreste, MD   Chief Complaint  Patient presents with  . Chest Pain  . Hypertension    History of Present Illness:    Jamie Foster is a 52 y.o. male with a hx of  DM, hyperlipidemia, HTN and chest pain.  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  Exercise stress test showed no ischemia but did have a hypertensive blood pressure response to exercise.  He was started on lisinopril 10 mg daily.  He is now here for follow-up. He is here today for followup and is doing well.  He denies any chest pain or pressure, SOB, DOE, PND, orthopnea, LE edema, dizziness, palpitations or syncope. He is compliant with his meds and is tolerating meds with no SE.      Past Medical History:  Diagnosis Date  . Allergy   . Diabetes mellitus without complication ([place])   . H pylori ulcer   . Hyperlipidemia   . Hypertension   . Kidney stone   . Sleep apnea     Past Surgical History:  Procedure Laterality Date  . NASAL SINUS SURGERY      Current Medications: Current Meds  Medication Sig  . Blood Glucose Monitoring Suppl (ONE TOUCH ULTRA 2) W/DEVICE KIT Use to test blood sugar daily as instructed.  Marland Kitchen glipiZIDE (GLUCOTROL) 10 MG tablet TAKE 1 TABLET (10 MG TOTAL) BY MOUTH 2 (TWO) TIMES DAILY BEFORE A MEAL.  Marland Kitchen glucose blood (ONE TOUCH ULTRA TEST) test strip Use to test blood sugar 2 times daily as instructed.  Marland Kitchen glucose blood (TRUETRACK TEST) test strip Use 2x a day  . JANUVIA 100 MG tablet TAKE 1 TABLET (100 MG TOTAL) BY MOUTH DAILY.  Marland Kitchen lisinopril (PRINIVIL,ZESTRIL) 10 MG tablet Take 1 tablet (10 mg total) by mouth daily. Call for follow up-prior to refill needed.  . meloxicam (MOBIC) 7.5 MG  tablet Take 1 tablet (7.5 mg total) by mouth daily as needed for pain.  . metFORMIN (GLUCOPHAGE-XR) 500 MG 24 hr tablet TAKE 2 TABLETS (1,000 MG TOTAL) BY MOUTH 2 TIMES DAILY WITH A MEAL.  Marland Kitchen ONETOUCH DELICA LANCETS 59D MISC Use to test blood sugar 2 times daily as instructed.  . sitaGLIPtin (JANUVIA) 100 MG tablet TAKE 1 TABLET (100 MG TOTAL) BY MOUTH DAILY.  Marland Kitchen [employer] LANCETS MISC Use 2x a day - Tru Track   Current Facility-Administered Medications for the 03/09/18 encounter (Office Visit) with Sueanne Margarita, MD  Medication  . 0.9 %  sodium chloride infusion     Allergies:   Patient has no known allergies.   Social History   Socioeconomic History  . Marital status: Married    Spouse name: Not on file  . Number of children: Not on file  . Years of education: Not on file  . Highest education level: Not on file  Occupational History  . Not on file  Social Needs  . Financial resource strain: Not on file  . Food insecurity:    Worry: Not on file    Inability: Not on file  . Transportation needs:    Medical: Not on file    Non-medical:  Not on file  Tobacco Use  . Smoking status: Never Smoker  . Smokeless tobacco: Never Used  Substance and Sexual Activity  . Alcohol use: No  . Drug use: No  . Sexual activity: Yes  Lifestyle  . Physical activity:    Days per week: Not on file    Minutes per session: Not on file  . Stress: Not on file  Relationships  . Social connections:    Talks on phone: Not on file    Gets together: Not on file    Attends religious service: Not on file    Active member of club or organization: Not on file    Attends meetings of clubs or organizations: Not on file    Relationship status: Not on file  Other Topics Concern  . Not on file  Social History Narrative   Married.Education: College/Other. Exercise: Walks.     Family History: The patient's family history includes Hypertension in his mother; Prostate cancer in his father. There is no  history of Colon cancer or Colon polyps.  ROS:   Please see the history of present illness.    ROS  All other systems reviewed and negative.   EKGs/Labs/Other Studies Reviewed:    The following studies were reviewed today: none  EKG:  EKG is  ordered today.  The ekg ordered today demonstrates NSR at 95bpm with no ST changes  Recent Labs: No results found for requested labs within last 8760 hours.   Recent Lipid Panel    Component Value Date/Time   CHOL 170 01/29/2017 1344   TRIG 118 01/29/2017 1344   HDL 48 01/29/2017 1344   CHOLHDL 3.5 01/29/2017 1344   CHOLHDL 4 12/21/2015 1028   VLDL 17.0 12/21/2015 1028   LDLCALC 98 01/29/2017 1344    Physical Exam:    VS:  BP (!) 150/94   Pulse 95   Ht '6\' 3"'$  (1.905 m)   Wt 227 lb (103 kg)   SpO2 98%   BMI 28.37 kg/m     Wt Readings from Last 3 Encounters:  03/09/18 227 lb (103 kg)  05/19/17 231 lb 3.2 oz (104.9 kg)  03/18/17 234 lb (106.1 kg)     GEN:  Well nourished, well developed in no acute distress HEENT: Normal NECK: No JVD; No carotid bruits LYMPHATICS: No lymphadenopathy CARDIAC: RRR, no murmurs, rubs, gallops RESPIRATORY:  Clear to auscultation without rales, wheezing or rhonchi  ABDOMEN: Soft, non-tender, non-distended MUSCULOSKELETAL:  No edema; No deformity  SKIN: Warm and dry NEUROLOGIC:  Alert and oriented x 3 PSYCHIATRIC:  Normal affect   ASSESSMENT:    1. Essential hypertension   2. Chest pain, unspecified type   3. Uncontrolled type 2 diabetes mellitus with retinopathy, without long-term current use of insulin (HCC)    PLAN:    In order of problems listed above:  1.  Hypertension - blood pressure borderline controlled on exam today.  He stopped taking his ACE I and has been noncompliant with his diabetes medications as well.  He wanted me to refill his Metformin and Januvia and I told hHe will continue on lisinopril 10 mg daily.  Check a bmet today.  2.  Chest pain - this occurred a year ago  and was very atypical in description and an exercise treadmill test a year ago showed no inducible ischemia.  3.  Type 2 DM - He wanted me to refill his Metformin and Januvia and I told him he needed to see his  PCP.  He has been noncompliant with his meds.  I instructed him to followup ASAP with his PCP to get his DM meds refilled and for routine followup.  I will have him followup with me on a PRN basis and see PCP for further management of his HTN.     Medication Adjustments/Labs and Tests Ordered: Current medicines are reviewed at length with the patient today.  Concerns regarding medicines are outlined above.  No orders of the defined types were placed in this encounter.  No orders of the defined types were placed in this encounter.   Signed, Fransico Him, MD  03/09/2018 9:24 AM    [place]

## 2018-03-09 NOTE — Patient Instructions (Addendum)
Medication Instructions:  Your physician has recommended you make the following change in your medication:   1. Restart Lisinopril, 10mg , one tablet daily.  Labwork: None ordered.  Testing/Procedures: Your physician has requested that you have cardiac CT. Cardiac computed tomography (CT) is a painless test that uses an x-ray machine to take clear, detailed pictures of your heart. For further information please visit [email or web address]. Please follow instruction sheet as given.   Follow-Up: Your physician recommends that you schedule a follow-up appointment as needed with Dr Mayford Knifeurner.  Any Other Special Instructions Will Be Listed Below (If Applicable).  Cardiac CT Instructions  [facility] at ___________________   (30-45 minutes prior to test start time)  [facility] [address] [place], [place] [address] [phone]  Proceed to the [facility] Radiology Department (First Floor).  Please follow these instructions carefully (unless otherwise directed):  Hold all erectile dysfunction medications at least 48 hours prior to test.  On the Night Before the Test: . Drink plenty of water. . Do not consume any caffeinated/decaffeinated beverages or chocolate 12 hours prior to your test. . Do not take any antihistamines 12 hours prior to your test. . If you take Glipizide, Metformin or Januvia, do not take 24 hours prior to test.  On the Day of the Test: . Drink plenty of water. Do not drink any water within one hour of the test. . Do not eat any food 4 hours prior to the test. . You may take your regular medications prior to the test. . IF NOT ON A BETA BLOCKER - Take 50 mg of lopressor (metoprolol) one hour before the test.   After the Test: . Drink plenty of water. . After receiving IV contrast, you may experience a mild flushed feeling. This is normal. . On occasion, you may experience a mild rash up  to 24 hours after the test. This is not dangerous. If this occurs, you can take Benadryl 25 mg and increase your fluid intake. . If you experience trouble breathing, this can be serious. If it is severe call 911 IMMEDIATELY. If it is mild, please call our office. . If you take any of these medications: Glipizide/Metformin, Avandament, Glucavance, please do not take 48 hours after completing test.    If you need a refill on your cardiac medications before your next appointment, please call your pharmacy.

## 2018-03-31 ENCOUNTER — Telehealth: Payer: Self-pay

## 2018-03-31 NOTE — Telephone Encounter (Signed)
-----   Message from Pricilla Holm sent at 03/26/2018  5:17 PM EDT ----- Regarding: RE: Cardiac CT He was just schedule this afternoon and you will need to send him a copy of the instruction 04-21-18 @ 10:30rob ----- Message ----- From: Silas [place] Sent: 03/26/2018   9:06 AM To: Pricilla Holm Subject: FW: Cardiac CT                                   ----- Message ----- From: Phineas Semen, RN Sent: 03/25/2018   4:07 PM To: [facility] Subject: FW: Cardiac CT                                 Following up on appt for cardiac CT   ----- Message ----- From: Oretha Milch, RN Sent: 03/09/2018  10:01 AM To: Kennis Carina, RN Subject: Cardiac CT                                     Hello,  Mr Bessinger will need a cardiac CT scheduled for Dr Mayford Knife.   Thanks!  [place]

## 2018-04-21 ENCOUNTER — Ambulatory Visit (HOSPITAL_COMMUNITY)
Admission: RE | Admit: 2018-04-21 | Discharge: 2018-04-21 | Disposition: A | Discharge status: Home / Self Care | Payer: 59 | Source: Ambulatory Visit | Attending: Cardiology | Admitting: Cardiology

## 2018-04-21 ENCOUNTER — Ambulatory Visit (HOSPITAL_COMMUNITY): Payer: 59

## 2018-04-21 ENCOUNTER — Other Ambulatory Visit: Payer: Self-pay

## 2018-04-21 DIAGNOSIS — I10 Essential (primary) hypertension: Secondary | ICD-10-CM

## 2018-04-21 DIAGNOSIS — E1165 Type 2 diabetes mellitus with hyperglycemia: Secondary | ICD-10-CM

## 2018-04-21 DIAGNOSIS — E11319 Type 2 diabetes mellitus with unspecified diabetic retinopathy without macular edema: Secondary | ICD-10-CM

## 2018-04-21 DIAGNOSIS — R079 Chest pain, unspecified: Secondary | ICD-10-CM

## 2018-04-21 DIAGNOSIS — IMO0002 Reserved for concepts with insufficient information to code with codable children: Secondary | ICD-10-CM

## 2018-04-21 MED ORDER — METOPROLOL TARTRATE 50 MG PO TABS: 50.0000 mg | ORAL_TABLET | Freq: Once | ORAL | 0 refills | Status: DC

## 2018-04-21 MED FILL — METOPROLOL TARTRATE 50 MG T: 50 | 1 days supply | Qty: 1 | Fill #0

## 2018-04-21 NOTE — Progress Notes (Signed)
Pt arrived for cardiac CT, was unable to complete scan due to having coffee prior to scan. Patient stated he took his one time dose of metoprolol but had a cup of coffee this morning. Pt will have to be rescheduled. One time dose of metoprolol ordered to be taken 1 hour prior to test.

## 2018-04-22 ENCOUNTER — Ambulatory Visit (HOSPITAL_COMMUNITY)
Admission: RE | Admit: 2018-04-22 | Discharge: 2018-04-22 | Disposition: A | Discharge status: Home / Self Care | Payer: 59 | Source: Ambulatory Visit | Attending: Cardiology | Admitting: Cardiology

## 2018-04-22 DIAGNOSIS — R0789 Other chest pain: Secondary | ICD-10-CM | POA: Diagnosis not present

## 2018-04-22 DIAGNOSIS — E1165 Type 2 diabetes mellitus with hyperglycemia: Secondary | ICD-10-CM | POA: Insufficient documentation

## 2018-04-22 DIAGNOSIS — I700 Atherosclerosis of aorta: Secondary | ICD-10-CM | POA: Insufficient documentation

## 2018-04-22 DIAGNOSIS — I10 Essential (primary) hypertension: Secondary | ICD-10-CM | POA: Insufficient documentation

## 2018-04-22 DIAGNOSIS — R079 Chest pain, unspecified: Secondary | ICD-10-CM | POA: Diagnosis not present

## 2018-04-22 DIAGNOSIS — E11319 Type 2 diabetes mellitus with unspecified diabetic retinopathy without macular edema: Secondary | ICD-10-CM | POA: Insufficient documentation

## 2018-04-22 DIAGNOSIS — I2510 Atherosclerotic heart disease of native coronary artery without angina pectoris: Secondary | ICD-10-CM | POA: Diagnosis not present

## 2018-04-22 LAB — POCT I-STAT CREATININE: Creatinine, Ser: 1 mg/dL (ref 0.61–1.24)

## 2018-04-22 MED ORDER — IOPAMIDOL (ISOVUE-370) INJECTION 76%
INTRAVENOUS | Status: AC
  Filled 2018-04-22: qty 100

## 2018-04-22 MED ORDER — IOPAMIDOL (ISOVUE-370) INJECTION 76%
100.0000 mL | Freq: Once | INTRAVENOUS | Status: AC | PRN
  Administered 2018-04-22: 100 mL via INTRAVENOUS

## 2018-04-22 MED ORDER — METOPROLOL TARTRATE 5 MG/5ML IV SOLN
5.0000 mg | Freq: Once | INTRAVENOUS | Status: DC
  Filled 2018-04-22: qty 5

## 2018-04-22 MED ORDER — NITROGLYCERIN 0.4 MG SL SUBL
0.8000 mg | SUBLINGUAL_TABLET | Freq: Once | SUBLINGUAL | Status: AC
  Administered 2018-04-22: 0.8 mg via SUBLINGUAL
  Filled 2018-04-22: qty 25

## 2018-04-22 MED ORDER — METOPROLOL TARTRATE 5 MG/5ML IV SOLN
INTRAVENOUS | Status: AC
  Administered 2018-04-22: 5 mg
  Filled 2018-04-22: qty 5

## 2018-04-22 MED ORDER — NITROGLYCERIN 0.4 MG SL SUBL
SUBLINGUAL_TABLET | SUBLINGUAL | Status: AC
  Filled 2018-04-22: qty 2

## 2018-04-27 ENCOUNTER — Telehealth: Payer: Self-pay

## 2018-04-27 DIAGNOSIS — E785 Hyperlipidemia, unspecified: Secondary | ICD-10-CM

## 2018-04-27 NOTE — Telephone Encounter (Signed)
Notes recorded by Phineas Semenobertson, Rosalie Buenaventura, RN on 04/27/2018 at 4:46 PM EDT Pt made aware of lab results. I informed pt Dr. Mayford Knifeurner recommends a repeat FLP and NMR. Pt in agreement with plan, he states he will discuss with his spouse and call back to schedule a lab appt.  Notes recorded by Quintella Reicherturner, Traci R, MD on 04/23/2018 at 4:11 PM EDT Please get repeat FLP with NMR  Notes recorded by Quintella Reicherturner, Traci R, MD on 04/23/2018 at 1:04 PM EDT Coronary CTA showed mild nonobstructive CAD with 0-25% mid LCx and low calcium score at 3.

## 2018-05-03 ENCOUNTER — Ambulatory Visit (INDEPENDENT_AMBULATORY_CARE_PROVIDER_SITE_OTHER): Payer: 59 | Admitting: Family Medicine

## 2018-05-03 ENCOUNTER — Encounter: Payer: Self-pay | Admitting: Family Medicine

## 2018-05-03 ENCOUNTER — Other Ambulatory Visit: Payer: Self-pay

## 2018-05-03 VITALS — BP 138/89 | HR 85 | Temp 98.3°F | Resp 18 | Ht 75.0 in | Wt 224.2 lb

## 2018-05-03 DIAGNOSIS — E11319 Type 2 diabetes mellitus with unspecified diabetic retinopathy without macular edema: Secondary | ICD-10-CM | POA: Diagnosis not present

## 2018-05-03 DIAGNOSIS — Z114 Encounter for screening for human immunodeficiency virus [HIV]: Secondary | ICD-10-CM

## 2018-05-03 DIAGNOSIS — R350 Frequency of micturition: Secondary | ICD-10-CM

## 2018-05-03 DIAGNOSIS — Z1159 Encounter for screening for other viral diseases: Secondary | ICD-10-CM | POA: Diagnosis not present

## 2018-05-03 DIAGNOSIS — R5383 Other fatigue: Secondary | ICD-10-CM | POA: Diagnosis not present

## 2018-05-03 DIAGNOSIS — Z0000 Encounter for general adult medical examination without abnormal findings: Secondary | ICD-10-CM | POA: Diagnosis not present

## 2018-05-03 DIAGNOSIS — R1084 Generalized abdominal pain: Secondary | ICD-10-CM | POA: Diagnosis not present

## 2018-05-03 DIAGNOSIS — Z125 Encounter for screening for malignant neoplasm of prostate: Secondary | ICD-10-CM | POA: Diagnosis not present

## 2018-05-03 DIAGNOSIS — M545 Low back pain, unspecified: Secondary | ICD-10-CM

## 2018-05-03 DIAGNOSIS — I10 Essential (primary) hypertension: Secondary | ICD-10-CM | POA: Diagnosis not present

## 2018-05-03 DIAGNOSIS — E785 Hyperlipidemia, unspecified: Secondary | ICD-10-CM

## 2018-05-03 DIAGNOSIS — Z23 Encounter for immunization: Secondary | ICD-10-CM

## 2018-05-03 LAB — POCT CBC
Granulocyte percent: 62.7 %G (ref 37–80)
HCT, POC: 40.9 % — AB (ref 43.5–53.7)
Hemoglobin: 13.6 g/dL — AB (ref 14.1–18.1)
Lymph, poc: 2 (ref 0.6–3.4)
MCH, POC: 29.8 pg (ref 27–31.2)
MCHC: 33.4 g/dL (ref 31.8–35.4)
MCV: 89.1 fL (ref 80–97)
MID (cbc): 0.1 (ref 0–0.9)
MPV: 7.2 fL (ref 0–99.8)
POC Granulocyte: 3.6 (ref 2–6.9)
POC LYMPH PERCENT: 35.4 %L (ref 10–50)
POC MID %: 1.9 %M (ref 0–12)
Platelet Count, POC: 291 10*3/uL (ref 142–424)
RBC: 4.58 M/uL — AB (ref 4.69–6.13)
RDW, POC: 13.7 %
WBC: 5.7 10*3/uL (ref 4.6–10.2)

## 2018-05-03 LAB — POCT URINALYSIS DIP (MANUAL ENTRY)
Bilirubin, UA: NEGATIVE
Blood, UA: NEGATIVE
Glucose, UA: NEGATIVE mg/dL
Ketones, POC UA: NEGATIVE mg/dL
Leukocytes, UA: NEGATIVE
Nitrite, UA: NEGATIVE
Protein Ur, POC: NEGATIVE mg/dL
Spec Grav, UA: 1.01 (ref 1.010–1.025)
Urobilinogen, UA: 0.2 E.U./dL
pH, UA: 6.5 (ref 5.0–8.0)

## 2018-05-03 LAB — POCT GLYCOSYLATED HEMOGLOBIN (HGB A1C): Hemoglobin A1C: 9.1 % — AB (ref 4.0–5.6)

## 2018-05-03 LAB — POC MICROSCOPIC URINALYSIS (UMFC): Mucus: ABSENT

## 2018-05-03 LAB — GLUCOSE, POCT (MANUAL RESULT ENTRY): POC Glucose: 177 mg/dl — AB (ref 70–99)

## 2018-05-03 MED ORDER — GLIPIZIDE 10 MG PO TABS: ORAL_TABLET | ORAL | 1 refills | Status: DC

## 2018-05-03 MED ORDER — SITAGLIPTIN PHOSPHATE 100 MG PO TABS
ORAL_TABLET | ORAL | 1 refills | Status: DC
Start: 2018-05-03 — End: 2018-07-08

## 2018-05-03 MED ORDER — LISINOPRIL 10 MG PO TABS: 10.0000 mg | ORAL_TABLET | Freq: Every day | ORAL | 1 refills | Status: DC

## 2018-05-03 MED ORDER — METFORMIN HCL 500 MG PO TABS
ORAL_TABLET | ORAL | 1 refills | Status: DC
Start: 2018-05-03 — End: 2018-07-08

## 2018-05-03 MED ORDER — ZOSTER VAC RECOMB ADJUVANTED 50 MCG/0.5ML IM SUSR: 0.5000 mL | Freq: Once | INTRAMUSCULAR | 1 refills | Status: AC

## 2018-05-03 MED FILL — glipiZIDE 10 MG TABS: 10 | 90 days supply | Qty: 180 | Fill #0

## 2018-05-03 MED FILL — JANUVIA 100 MG TABLET: 100 | 90 days supply | Qty: 90 | Fill #0

## 2018-05-03 MED FILL — metFORMIN HCL 500 MG TABS: 500 | 90 days supply | Qty: 270 | Fill #0

## 2018-05-03 MED FILL — LISINOPRIL 10 MG TABLET: 10 | 90 days supply | Qty: 90 | Fill #0

## 2018-05-03 NOTE — Patient Instructions (Addendum)
For lower back pain and abdominal symptoms, I will check some other testing. Tylenol is ok temporarily if needed for back pain, but follow up to discuss that area further in the next 1 week.  Return to the clinic or go to the nearest emergency room if any of your symptoms worsen or new symptoms occur.  Please follow up to discuss fatigue and other concerns from your paperwork in the next week or two, and can discuss results from today further at that time.   I will check labs requested by cardiology with your blood work today.   I will refer you to eye doctor.   I will send shingles vaccine to your pharmacy.   Try one metformin in the morning, two at night, continue glipizide same dose, januvia same dose, and bring record of blood sugars (fasting or 2 hour after meals) in next 2 weeks to decide on med changes.   Return to the clinic or go to the nearest emergency room if any of your symptoms worsen or new symptoms occur.   Type 2 Diabetes Mellitus, Self Care, Adult When you have type 2 diabetes (type 2 diabetes mellitus), you must keep your blood sugar (glucose) under control. You can do this with:  Nutrition.  Exercise.  Lifestyle changes.  Medicines or insulin, if needed.  Support from your doctors and others.  How do I manage my blood sugar?  Check your blood sugar level every day, as often as told.  Call your doctor if your blood sugar is above your goal numbers for 2 tests in a row.  Have your A1c (hemoglobin A1c) level checked at least two times a year. Have it checked more often if your doctor tells you to. Your doctor will set treatment goals for you. Generally, you should have these blood sugar levels:  Before meals (preprandial): 80-130 mg/dL (4.4-7.2 mmol/L).  After meals (postprandial): lower than 180 mg/dL (10 mmol/L).  A1c level: less than 7%.  What do I need to know about high blood sugar? High blood sugar is called hyperglycemia. Know the signs of high  blood sugar. Signs may include:  Feeling: ? Thirsty. ? Hungry. ? Very tired.  Needing to pee (urinate) more than usual.  Blurry vision.  What do I need to know about low blood sugar? Low blood sugar is called hypoglycemia. This is when blood sugar is at or below 70 mg/dL (3.9 mmol/L). Symptoms may include:  Feeling: ? Hungry. ? Worried or nervous (anxious). ? Sweaty and clammy. ? Confused. ? Dizzy. ? Sleepy. ? Sick to your stomach (nauseous).  Having: ? A fast heartbeat (palpitations). ? A headache. ? A change in your vision. ? Jerky movements that you cannot control (seizure). ? Nightmares. ? Tingling or no feeling (numbness) around the mouth, lips, or tongue.  Having trouble with: ? Talking. ? Paying attention (concentrating). ? Moving (coordination). ? Sleeping.  Shaking.  Passing out (fainting).  Getting upset easily (irritability).  Treating low blood sugar  To treat low blood sugar, eat or drink something sugary right away. If you can think clearly and swallow safely, follow the 15:15 rule:  Take 15 grams of a fast-acting carb (carbohydrate). Some fast-acting carbs are: ? 1 tube of glucose gel. ? 3 sugar tablets (glucose pills). ? 6-8 pieces of hard candy. ? 4 oz (120 mL) of fruit juice. ? 4 oz (120 mL) regular (not diet) soda.  Check your blood sugar 15 minutes after you take the carb.  If  your blood sugar is still at or below 70 mg/dL (3.9 mmol/L), take 15 grams of a carb again.  If your blood sugar does not go above 70 mg/dL (3.9 mmol/L) after 3 tries, get help right away.  After your blood sugar goes back to normal, eat a meal or a snack within 1 hour.  Treating very low blood sugar If your blood sugar is at or below 54 mg/dL (3 mmol/L), you have very low blood sugar (severe hypoglycemia). This is an emergency. Do not wait to see if the symptoms will go away. Get medical help right away. Call your local emergency services (911 in the [place] Do  not drive yourself to the hospital. If you have very low blood sugar and you cannot eat or drink, you may need a glucagon shot (injection). A family member or friend should learn how to check your blood sugar and how to give you a glucagon shot. Ask your doctor if you need to have a glucagon shot kit at home. What else is important to manage my diabetes? Medicine Follow these instructions about insulin and diabetes medicines:  Take them as told by your doctor.  Adjust them as told by your doctor.  Do not run out of them.  Having diabetes can raise your risk for other long-term conditions. These include heart or kidney disease. Your doctor may prescribe medicines to help prevent problems from diabetes. Food   Make healthy food choices. These include: ? Chicken, fish, egg whites, and beans. ? Oats, whole wheat, bulgur, brown rice, quinoa, and millet. ? Fresh fruits and vegetables. ? Low-fat dairy products. ? Nuts, avocado, olive oil, and canola oil.  Make a food plan with a specialist (dietitian).  Follow instructions from your doctor about what you cannot eat or drink.  Drink enough fluid to keep your pee (urine) clear or pale yellow.  Eat healthy snacks between healthy meals.  Keep track of carbs that you eat. Read food labels. Learn food serving sizes.  Follow your sick day plan when you cannot eat or drink normally. Make this plan with your doctor so it is ready to use. Activity  Exercise at least 3 times a week.  Do not go more than 2 days without exercising.  Talk with your doctor before you start a new exercise. Your doctor may need to adjust your insulin, medicines, or food. Lifestyle   Do not use any tobacco products. These include cigarettes, chewing tobacco, and e-cigarettes.If you need help quitting, ask your doctor.  Ask your doctor how much alcohol is safe for you.  Learn to deal with stress. If you need help with this, ask your doctor. Body care  Stay  up to date with your shots (immunizations).  Have your eyes and feet checked by a doctor as often as told.  Check your skin and feet every day. Check for cuts, bruises, redness, blisters, or sores.  Brush your teeth and gums two times a day.  Floss at least one time a day.  Go to the dentist least one time every 6 months.  Stay at a healthy weight. General instructions   Take over-the-counter and prescription medicines only as told by your doctor.  Share your diabetes care plan with: ? Your work or school. ? People you live with.  Check your pee (urine) for ketones: ? When you are sick. ? As told by your doctor.  Carry a card or wear jewelry that says that you have diabetes.  Ask  your doctor: ? Do I need to meet with a diabetes educator? ? Where can I find a support group for people with diabetes?  Keep all follow-up visits as told by your doctor. This is important. Where to find more information: To learn more about diabetes, visit:  American Diabetes Association: www.diabetes.org  American Association of Diabetes Educators: www.diabeteseducator.org/patient-resources  This information is not intended to replace advice given to you by your health care provider. Make sure you discuss any questions you have with your health care provider. Document Released: 03/03/2016 Document Revised: 04/17/2016 Document Reviewed: 12/14/2015 Elsevier Interactive Patient Education  2018 [employer].   Diabetes Mellitus and Standards of Medical Care Managing diabetes (diabetes mellitus) can be complicated. Your diabetes treatment may be managed by a team of health care providers, including:  A diet and nutrition specialist (registered dietitian).  A nurse.  A certified diabetes educator (CDE).  A diabetes specialist (endocrinologist).  An eye doctor.  A primary care provider.  A dentist.  Your health care providers follow a schedule in order to help you get the best  quality of care. The following schedule is a general guideline for your diabetes management plan. Your health care providers may also give you more specific instructions. HbA1c ( hemoglobin A1c) test This test provides information about blood sugar (glucose) control over the previous 2-3 months. It is used to check whether your diabetes management plan needs to be adjusted.  If you are meeting your treatment goals, this test is done at least 2 times a year.  If you are not meeting treatment goals or if your treatment goals have changed, this test is done 4 times a year.  Blood pressure test  This test is done at every routine medical visit. For most people, the goal is less than 130/80. Ask your health care provider what your goal blood pressure should be. Dental and eye exams  Visit your dentist two times a year.  If you have type 1 diabetes, get an eye exam 3-5 years after you are diagnosed, and then once a year after your first exam. ? If you were diagnosed with type 1 diabetes as a child, get an eye exam when you are age 42 or older and have had diabetes for 3-5 years. After the first exam, you should get an eye exam once a year.  If you have type 2 diabetes, have an eye exam as soon as you are diagnosed, and then once a year after your first exam. Foot care exam  Visual foot exams are done at every routine medical visit. The exams check for cuts, bruises, redness, blisters, sores, or other problems with the feet.  A complete foot exam is done by your health care provider once a year. This exam includes an inspection of the structure and skin of your feet, and a check of the pulses and sensation in your feet. ? Type 1 diabetes: Get your first exam 3-5 years after diagnosis. ? Type 2 diabetes: Get your first exam as soon as you are diagnosed.  Check your feet every day for cuts, bruises, redness, blisters, or sores. If you have any of these or other problems that are not healing,  contact your health care provider. Kidney function test ( urine microalbumin)  This test is done once a year. ? Type 1 diabetes: Get your first test 5 years after diagnosis. ? Type 2 diabetes: Get your first test as soon as you are diagnosed.  If you have  chronic kidney disease (CKD), get a serum creatinine and estimated glomerular filtration rate (eGFR) test once a year. Lipid profile (cholesterol, HDL, LDL, triglycerides)  This test should be done when you are diagnosed with diabetes, and every 5 years after the first test. If you are on medicines to lower your cholesterol, you may need to get this test done every year. ? The goal for LDL is less than 100 mg/dL (5.5 mmol/L). If you are at high risk, the goal is less than 70 mg/dL (3.9 mmol/L). ? The goal for HDL is 40 mg/dL (2.2 mmol/L) for men and 50 mg/dL(2.8 mmol/L) for women. An HDL cholesterol of 60 mg/dL (3.3 mmol/L) or higher gives some protection against heart disease. ? The goal for triglycerides is less than 150 mg/dL (8.3 mmol/L). Immunizations  The yearly flu (influenza) vaccine is recommended for everyone 6 months or older who has diabetes.  The pneumonia (pneumococcal) vaccine is recommended for everyone 2 years or older who has diabetes. If you are 57 or older, you may get the pneumonia vaccine as a series of two separate shots.  The hepatitis B vaccine is recommended for adults shortly after they have been diagnosed with diabetes.  The Tdap (tetanus, diphtheria, and pertussis) vaccine should be given: ? According to normal childhood vaccination schedules, for children. ? Every 10 years, for adults who have diabetes.  The shingles vaccine is recommended for people who have had chicken pox and are 50 years or older. Mental and emotional health  Screening for symptoms of eating disorders, anxiety, and depression is recommended at the time of diagnosis and afterward as needed. If your screening shows that you have symptoms  (you have a positive screening result), you may need further evaluation and be referred to a mental health care provider. Diabetes self-management education  Education about how to manage your diabetes is recommended at diagnosis and ongoing as needed. Treatment plan  Your treatment plan will be reviewed at every medical visit. Summary  Managing diabetes (diabetes mellitus) can be complicated. Your diabetes treatment may be managed by a team of health care providers.  Your health care providers follow a schedule in order to help you get the best quality of care.  Standards of care including having regular physical exams, blood tests, blood pressure monitoring, immunizations, screening tests, and education about how to manage your diabetes.  Your health care providers may also give you more specific instructions based on your individual health. This information is not intended to replace advice given to you by your health care provider. Make sure you discuss any questions you have with your health care provider. Document Released: 09/07/2009 Document Revised: 08/08/2016 Document Reviewed: 08/08/2016 Elsevier Interactive Patient Education  2018 [employer].     IF you received an x-ray today, you will receive an invoice from [facility]. Please contact [facility] at [phone] with questions or concerns regarding your invoice.   IF you received labwork today, you will receive an invoice from [facility]. Please contact [facility] at [phone] with questions or concerns regarding your invoice.   Our billing staff will not be able to assist you with questions regarding bills from these companies.  You will be contacted with the lab results as soon as they are available. The fastest way to get your results is to activate your My Chart account. Instructions are located on the last page of this paperwork. If you have not heard from [place] regarding the results in 2 weeks,  please contact  this office.

## 2018-05-03 NOTE — Progress Notes (Addendum)
Subjective:  By signing my name below, I, Moises Blood, attest that this documentation has been prepared under the direction and in the presence of Merri Ray, MD. Electronically Signed: Moises Blood, [place]. 05/03/2018 , 4:11 PM .  Patient was seen in Room 11 .   Patient ID: Jamie Foster, male    DOB: 1966-03-11, 52 y.o.   MRN: [id number] Chief Complaint  Patient presents with  . Annual Exam    Pt is fasting    HPI Jamie Foster is a 52 y.o. male Here for annual physical. He has a history of diabetes, HTN, GERD, and cervical DDD. Last physical with me was in March 2018 and was a new patient to me at that time.   He has 4 children: 93 year old, 23 year old, 33 year old and 54 year old. 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   Low abdominal pain He notes having bilateral lower abdominal pain, radiating into his bilateral flanks that started about 2 weeks ago. When he's sitting up, he feels more soreness into his back. He noticed pain occurs more when he's holding onto his urine. He denies trouble with urinating, and no more frequency than usual. He denies fever, incontinence, or saddle anesthesia. He denies back injury. He denies hematuria, nausea, vomiting or diarrhea.   Fatigue He's been feeling more tired in the past week or two. He denies chest pain.   Shortness of breath Not currently.   Decreased concentration + sleep disturbance He states there's been a lot on his mind. He denies SI/HI. He denies any change in thirst recently.   Diabetes He has been followed by Dr. Cruzita Lederer. He was seen on Dec 6th, 2017, and did not follow up. Last A1C of 10.3 from Dec 2017, not recent A1C since. Complicated diabetes with diabetic retinopathy. He was planning to seek care with a different endocrinologist.   He states he's been taking metformin twice a day if he's at home, but if he's leaving the house, he doesn't take the morning dose of metformin. He denies missing  any doses of his glipizide or Januvia, as he's had refills. He's been checking his blood sugars at home, running about 150s, lowest around 120s. He denies any low blood sugars. When he takes metformin in the morning, he has mild abdominal pain. He denies any diarrhea.   HTN Prior history of chest pain. He had a stress test without ischemia but had hypertensive BP response with exercise. He was started on Lisinopril 10 mg qd. He saw cardiologist, Dr. Radford Pax, on April 16th; asymptomatic at that point from cardiac standpoint with BP of 150/94. Thought to be due to non compliance as off medications. Recommended follow up HTN with me and cardiac follow up as needed.   Patient states he's been out of his Lisinopril since last week. He denies side effects with the Lisinopril. He notes his BP fluctuates. He denies chest pain, shortness of breath, lightheadedness, or dizziness.   Hyperlipidemia Lab Results  Component Value Date   CHOL 170 01/29/2017   HDL 48 01/29/2017   LDLCALC 98 01/29/2017   TRIG 118 01/29/2017   CHOLHDL 3.5 01/29/2017   Last lipid panel in March 2018. He was improved from LDL 134 prior.   HIV screening: He states he's had it done in the past, but doesn't recall, and agrees to add to blood work today.  Hep C screening: He states he's had it done in the past, but  doesn't recall, and agrees to add to blood work today.   Cancer Screening Colonoscopy: last done on 07/23/16 by Dr. Ardis Hughs. Repeat in 10 years.  Prostate cancer screening:  Lab Results  Component Value Date   PSA1 0.8 01/29/2017    Immunizations Immunization History  Administered Date(s) Administered  . Pneumococcal Conjugate-13 01/30/2016  . Tdap 01/30/2016   Shingles: will be sent to the pharmacy.  Pneumovax: agrees to update pneumovax today.   Depression Depression screen [facility] 2/9 05/03/2018 05/19/2017 01/29/2017 01/29/2017 01/29/2017  Decreased Interest 0 0 0 0 0  Down, Depressed, Hopeless 0 0 0 0 0  PHQ - 2  Score 0 0 0 0 0    Vision No exam data present  Last seen eye doctor about 2 years ago, prior patient of Dr. Trinna Post. He has history of diabetic retinopathy.   Dentist He is followed by dentist regularly, last seen about 3 weeks ago.   Exercise He hasn't been exercising as frequent as before.   Patient Active Problem List   Diagnosis Date Noted  . Chest pain 03/18/2017  . Uncontrolled type 2 diabetes mellitus with retinopathy, without long-term current use of insulin ([place]) 12/21/2015  . Family hx of prostate cancer 06/22/2013  . Allergic conjunctivitis 06/22/2013  . Allergic rhinitis 06/22/2013  . OSA (obstructive sleep apnea) 10/06/2012  . BMI 30.0-30.9,adult 07/19/2012  . HTN (hypertension) 07/19/2012  . GERD (gastroesophageal reflux disease) 07/19/2012  . DDD (degenerative disc disease), cervical 07/19/2012  . Sinusitis, chronic 07/19/2012   Past Medical History:  Diagnosis Date  . Allergy   . Diabetes mellitus without complication ([place])   . H pylori ulcer   . Hyperlipidemia   . Hypertension   . Kidney stone   . Sleep apnea    Past Surgical History:  Procedure Laterality Date  . NASAL SINUS SURGERY     No Known Allergies Prior to Admission medications   Medication Sig Start Date End Date Taking? Authorizing Provider  Blood Glucose Monitoring Suppl (ONE TOUCH ULTRA 2) W/DEVICE KIT Use to test blood sugar daily as instructed. 01/24/15   Philemon Kingdom, MD  glipiZIDE (GLUCOTROL) 10 MG tablet TAKE 1 TABLET (10 MG TOTAL) BY MOUTH 2 (TWO) TIMES DAILY BEFORE A MEAL. 10/27/16   Philemon Kingdom, MD  glucose blood (ONE TOUCH ULTRA TEST) test strip Use to test blood sugar 2 times daily as instructed. 10/27/16   Philemon Kingdom, MD  glucose blood (TRUETRACK TEST) test strip Use 2x a day 10/27/16   Philemon Kingdom, MD  JANUVIA 100 MG tablet TAKE 1 TABLET (100 MG TOTAL) BY MOUTH DAILY. 08/12/17   Philemon Kingdom, MD  lisinopril (PRINIVIL,ZESTRIL) 10 MG tablet Take 1 tablet  (10 mg total) by mouth daily. Call for follow up-prior to refill needed. 03/09/18   Sueanne Margarita, MD  meloxicam (MOBIC) 7.5 MG tablet Take 1 tablet (7.5 mg total) by mouth daily as needed for pain. 01/29/17   Wendie Agreste, MD  metFORMIN (GLUCOPHAGE-XR) 500 MG 24 hr tablet TAKE 2 TABLETS (1,000 MG TOTAL) BY MOUTH 2 TIMES DAILY WITH A MEAL. 10/27/16   Philemon Kingdom, MD  metoprolol tartrate (LOPRESSOR) 50 MG tablet Take 1 tablet (50 mg total) by mouth once for 1 dose. 04/21/18 04/21/18  Sueanne Margarita, MD  [facility] DELICA LANCETS 91Y MISC Use to test blood sugar 2 times daily as instructed. 01/24/15   Philemon Kingdom, MD  sitaGLIPtin (JANUVIA) 100 MG tablet TAKE 1 TABLET (100 MG TOTAL) BY MOUTH DAILY.  10/27/16   Philemon Kingdom, MD  [facility] LANCETS MISC Use 2x a day - Tru Track 06/09/14   Philemon Kingdom, MD   Social History   Socioeconomic History  . Marital status: Married    Spouse name: Not on file  . Number of children: Not on file  . Years of education: Not on file  . Highest education level: Not on file  Occupational History  . Not on file  Social Needs  . Financial resource strain: Not on file  . Food insecurity:    Worry: Not on file    Inability: Not on file  . Transportation needs:    Medical: Not on file    Non-medical: Not on file  Tobacco Use  . Smoking status: Never Smoker  . Smokeless tobacco: Never Used  Substance and Sexual Activity  . Alcohol use: No  . Drug use: No  . Sexual activity: Yes  Lifestyle  . Physical activity:    Days per week: Not on file    Minutes per session: Not on file  . Stress: Not on file  Relationships  . Social connections:    Talks on phone: Not on file    Gets together: Not on file    Attends religious service: Not on file    Active member of club or organization: Not on file    Attends meetings of clubs or organizations: Not on file    Relationship status: Not on file  . Intimate partner violence:    Fear of current  or ex partner: Not on file    Emotionally abused: Not on file    Physically abused: Not on file    Forced sexual activity: Not on file  Other Topics Concern  . Not on file  Social History Narrative   Married.Education: College/Other. Exercise: Walks.   Review of Systems 13 point ROS - positive for activity change, fatigue, shortness of breath, back pain, decreased concentration and sleep disturbance     Objective:   Physical Exam  Constitutional: He is oriented to person, place, and time. He appears well-developed and well-nourished.  HENT:  Head: Normocephalic and atraumatic.  Right Ear: External ear normal.  Left Ear: External ear normal.  Mouth/Throat: Oropharynx is clear and moist.  Eyes: Pupils are equal, round, and reactive to light. Conjunctivae and EOM are normal.  Neck: Normal range of motion. Neck supple. No thyromegaly present.  Cardiovascular: Normal rate, regular rhythm, normal heart sounds and intact distal pulses.  Pulmonary/Chest: Effort normal and breath sounds normal. No respiratory distress. He has no wheezes.  Abdominal: Soft. He exhibits no distension. There is no tenderness. Hernia confirmed negative in the right inguinal area and confirmed negative in the left inguinal area.  Genitourinary: Prostate normal.  Musculoskeletal: Normal range of motion. He exhibits no edema or tenderness.  No bony focal tenderness of lumbar spine  Lymphadenopathy:    He has no cervical adenopathy.  Neurological: He is alert and oriented to person, place, and time. He has normal reflexes.  Skin: Skin is warm and dry.  Psychiatric: He has a normal mood and affect. His behavior is normal.  Vitals reviewed.   Vitals:   05/03/18 1506  BP: 138/89  Pulse: 85  Resp: 18  Temp: 98.3 F (36.8 C)  TempSrc: Oral  SpO2: 97%  Weight: 224 lb 3.2 oz (101.7 kg)  Height: '6\' 3"'$  (1.905 m)   Results for orders placed or performed in visit on 05/03/18  POCT glucose (manual  entry)  Result  Value Ref Range   POC Glucose 177 (A) 70 - 99 mg/dl  POCT CBC  Result Value Ref Range   WBC 5.7 4.6 - 10.2 K/uL   Lymph, poc 2.0 0.6 - 3.4   POC LYMPH PERCENT 35.4 10 - 50 %L   MID (cbc) 0.1 0 - 0.9   POC MID % 1.9 0 - 12 %M   POC Granulocyte 3.6 2 - 6.9   Granulocyte percent 62.7 37 - 80 %G   RBC 4.58 (A) 4.69 - 6.13 M/uL   Hemoglobin 13.6 (A) 14.1 - 18.1 g/dL   HCT, POC 40.9 (A) 43.5 - 53.7 %   MCV 89.1 80 - 97 fL   MCH, POC 29.8 27 - 31.2 pg   MCHC 33.4 31.8 - 35.4 g/dL   RDW, POC 13.7 %   Platelet Count, POC 291 142 - 424 K/uL   MPV 7.2 0 - 99.8 fL  POCT glycosylated hemoglobin (Hb A1C)  Result Value Ref Range   Hemoglobin A1C 9.1 (A) 4.0 - 5.6 %   HbA1c, POC (prediabetic range)  5.7 - 6.4 %   HbA1c, POC (controlled diabetic range)  0.0 - 7.0 %  POCT urinalysis dipstick  Result Value Ref Range   Color, UA yellow yellow   Clarity, UA clear clear   Glucose, UA negative negative mg/dL   Bilirubin, UA negative negative   Ketones, POC UA negative negative mg/dL   Spec Grav, UA 1.010 1.010 - 1.025   Blood, UA negative negative   pH, UA 6.5 5.0 - 8.0   Protein Ur, POC negative negative mg/dL   Urobilinogen, UA 0.2 0.2 or 1.0 E.U./dL   Nitrite, UA Negative Negative   Leukocytes, UA Negative Negative  POCT Microscopic Urinalysis ([facility])  Result Value Ref Range   WBC,UR,HPF,POC None None WBC/hpf   RBC,UR,HPF,POC None None RBC/hpf   Bacteria None None, Too numerous to count   Mucus Absent Absent   Epithelial Cells, UR Per Microscopy None None, Too numerous to count cells/hpf       Assessment & Plan:    Jamie Foster is a 52 y.o. male Annual physical exam  - -anticipatory guidance as below in AVS, screening labs above. Health maintenance items as above in HPI discussed/recommended as applicable.   Fatigue, unspecified type - Plan: POCT CBC, POCT urinalysis dipstick  -Possible multiple causes, start with anemia screening, and evaluate  diabetes control.  Plan on  follow-up in 2 weeks to discuss further  Type 2 diabetes mellitus with retinopathy, without long-term current use of insulin, macular edema presence unspecified, unspecified laterality, unspecified retinopathy severity ([place]) - Plan: POCT glucose (manual entry), POCT glycosylated hemoglobin (Hb A1C), Comprehensive metabolic panel, Ambulatory referral to Ophthalmology, CANCELED: Lipid panel  -Check control, consider changes if elevated A1c, but keeping in mind that he has not always taken second metformin dose.  Does request possible different endocrinology referral, but can meet in the next 2 weeks to review A1c, current meds and decide if endocrinology eval needed.  Essential hypertension - Plan: Comprehensive metabolic panel, lisinopril (PRINIVIL,ZESTRIL) 10 MG tablet  -Elevated off lisinopril recently.  Restart lisinopril, monitor home readings, recheck 2 weeks.  Generalized abdominal pain - Plan: POCT urinalysis dipstick, POCT Microscopic Urinalysis ([facility]) Low back pain without sciatica, unspecified back pain laterality, unspecified chronicity Urinary frequency - Plan: POCT urinalysis dipstick, POCT Microscopic Urinalysis ([facility]), PSA  -Reassuring urinalysis, CBC.  May be related to lumbar spine.  Symptomatic control, RTC  precautions if worsening, recheck 2 weeks.   Hyperlipidemia, unspecified hyperlipidemia type - Plan: Lipid panel, NMR, lipoprofile, Lipoprotein A (LPA), Apolipoprotein B, CANCELED: Lipid panel  -Labs checked as above, can discuss results further with cardiology but can review results next visit, to discuss medication changes.  Continue Lipitor 20 mg for now.  Screening for prostate cancer - Plan: PSA  -.We discussed pros and cons of prostate cancer screening, and after this discussion, he chose to have screening done. PSA obtained, and no concerning findings on DRE.   Encounter for hepatitis C screening test for low risk patient - Plan: Hepatitis C antibody  Screening for HIV  (human immunodeficiency virus) - Plan: HIV antibody  Need for prophylactic vaccination against Streptococcus pneumoniae (pneumococcus) - Plan: Pneumococcal polysaccharide vaccine 23-valent greater than or equal to 2yo subcutaneous/IM  Need for shingles vaccine - Plan: Zoster Vaccine Adjuvanted [facility]) injection  -Sent to pharmacy  Meds ordered this encounter  Medications  . lisinopril (PRINIVIL,ZESTRIL) 10 MG tablet    Sig: Take 1 tablet (10 mg total) by mouth daily.    Dispense:  90 tablet    Refill:  1  . Zoster Vaccine Adjuvanted [facility]) injection    Sig: Inject 0.5 mLs into the muscle once for 1 dose. Repeat in 2-6 months.    Dispense:  0.5 mL    Refill:  1  . sitaGLIPtin (JANUVIA) 100 MG tablet    Sig: TAKE 1 TABLET (100 MG TOTAL) BY MOUTH DAILY.    Dispense:  90 tablet    Refill:  1  . metFORMIN (GLUCOPHAGE) 500 MG tablet    Sig: Take 1 tablet in the morning, 2 tablets in the evening.    Dispense:  270 tablet    Refill:  1  . glipiZIDE (GLUCOTROL) 10 MG tablet    Sig: TAKE 1 TABLET (10 MG TOTAL) BY MOUTH 2 (TWO) TIMES DAILY BEFORE A MEAL.    Dispense:  180 tablet    Refill:  1   Patient Instructions    For lower back pain and abdominal symptoms, I will check some other testing. Tylenol is ok temporarily if needed for back pain, but follow up to discuss that area further in the next 1 week.  Return to the clinic or go to the nearest emergency room if any of your symptoms worsen or new symptoms occur.  Please follow up to discuss fatigue and other concerns from your paperwork in the next week or two, and can discuss results from today further at that time.   I will check labs requested by cardiology with your blood work today.   I will refer you to eye doctor.   I will send shingles vaccine to your pharmacy.   Try one metformin in the morning, two at night, continue glipizide same dose, januvia same dose, and bring record of blood sugars (fasting or 2 hour after  meals) in next 2 weeks to decide on med changes.   Return to the clinic or go to the nearest emergency room if any of your symptoms worsen or new symptoms occur.   Type 2 Diabetes Mellitus, Self Care, Adult When you have type 2 diabetes (type 2 diabetes mellitus), you must keep your blood sugar (glucose) under control. You can do this with:  Nutrition.  Exercise.  Lifestyle changes.  Medicines or insulin, if needed.  Support from your doctors and others.  How do I manage my blood sugar?  Check your blood sugar level every day,  as often as told.  Call your doctor if your blood sugar is above your goal numbers for 2 tests in a row.  Have your A1c (hemoglobin A1c) level checked at least two times a year. Have it checked more often if your doctor tells you to. Your doctor will set treatment goals for you. Generally, you should have these blood sugar levels:  Before meals (preprandial): 80-130 mg/dL (4.4-7.2 mmol/L).  After meals (postprandial): lower than 180 mg/dL (10 mmol/L).  A1c level: less than 7%.  What do I need to know about high blood sugar? High blood sugar is called hyperglycemia. Know the signs of high blood sugar. Signs may include:  Feeling: ? Thirsty. ? Hungry. ? Very tired.  Needing to pee (urinate) more than usual.  Blurry vision.  What do I need to know about low blood sugar? Low blood sugar is called hypoglycemia. This is when blood sugar is at or below 70 mg/dL (3.9 mmol/L). Symptoms may include:  Feeling: ? Hungry. ? Worried or nervous (anxious). ? Sweaty and clammy. ? Confused. ? Dizzy. ? Sleepy. ? Sick to your stomach (nauseous).  Having: ? A fast heartbeat (palpitations). ? A headache. ? A change in your vision. ? Jerky movements that you cannot control (seizure). ? Nightmares. ? Tingling or no feeling (numbness) around the mouth, lips, or tongue.  Having trouble with: ? Talking. ? Paying attention (concentrating). ? Moving  (coordination). ? Sleeping.  Shaking.  Passing out (fainting).  Getting upset easily (irritability).  Treating low blood sugar  To treat low blood sugar, eat or drink something sugary right away. If you can think clearly and swallow safely, follow the 15:15 rule:  Take 15 grams of a fast-acting carb (carbohydrate). Some fast-acting carbs are: ? 1 tube of glucose gel. ? 3 sugar tablets (glucose pills). ? 6-8 pieces of hard candy. ? 4 oz (120 mL) of fruit juice. ? 4 oz (120 mL) regular (not diet) soda.  Check your blood sugar 15 minutes after you take the carb.  If your blood sugar is still at or below 70 mg/dL (3.9 mmol/L), take 15 grams of a carb again.  If your blood sugar does not go above 70 mg/dL (3.9 mmol/L) after 3 tries, get help right away.  After your blood sugar goes back to normal, eat a meal or a snack within 1 hour.  Treating very low blood sugar If your blood sugar is at or below 54 mg/dL (3 mmol/L), you have very low blood sugar (severe hypoglycemia). This is an emergency. Do not wait to see if the symptoms will go away. Get medical help right away. Call your local emergency services (911 in the [place] Do not drive yourself to the hospital. If you have very low blood sugar and you cannot eat or drink, you may need a glucagon shot (injection). A family member or friend should learn how to check your blood sugar and how to give you a glucagon shot. Ask your doctor if you need to have a glucagon shot kit at home. What else is important to manage my diabetes? Medicine Follow these instructions about insulin and diabetes medicines:  Take them as told by your doctor.  Adjust them as told by your doctor.  Do not run out of them.  Having diabetes can raise your risk for other long-term conditions. These include heart or kidney disease. Your doctor may prescribe medicines to help prevent problems from diabetes. Food   Make healthy food choices. These  include: ? Chicken, fish, egg whites, and beans. ? Oats, whole wheat, bulgur, brown rice, quinoa, and millet. ? Fresh fruits and vegetables. ? Low-fat dairy products. ? Nuts, avocado, olive oil, and canola oil.  Make a food plan with a specialist (dietitian).  Follow instructions from your doctor about what you cannot eat or drink.  Drink enough fluid to keep your pee (urine) clear or pale yellow.  Eat healthy snacks between healthy meals.  Keep track of carbs that you eat. Read food labels. Learn food serving sizes.  Follow your sick day plan when you cannot eat or drink normally. Make this plan with your doctor so it is ready to use. Activity  Exercise at least 3 times a week.  Do not go more than 2 days without exercising.  Talk with your doctor before you start a new exercise. Your doctor may need to adjust your insulin, medicines, or food. Lifestyle   Do not use any tobacco products. These include cigarettes, chewing tobacco, and e-cigarettes.If you need help quitting, ask your doctor.  Ask your doctor how much alcohol is safe for you.  Learn to deal with stress. If you need help with this, ask your doctor. Body care  Stay up to date with your shots (immunizations).  Have your eyes and feet checked by a doctor as often as told.  Check your skin and feet every day. Check for cuts, bruises, redness, blisters, or sores.  Brush your teeth and gums two times a day.  Floss at least one time a day.  Go to the dentist least one time every 6 months.  Stay at a healthy weight. General instructions   Take over-the-counter and prescription medicines only as told by your doctor.  Share your diabetes care plan with: ? Your work or school. ? People you live with.  Check your pee (urine) for ketones: ? When you are sick. ? As told by your doctor.  Carry a card or wear jewelry that says that you have diabetes.  Ask your doctor: ? Do I need to meet with a diabetes  educator? ? Where can I find a support group for people with diabetes?  Keep all follow-up visits as told by your doctor. This is important. Where to find more information: To learn more about diabetes, visit:  American Diabetes Association: www.diabetes.org  American Association of Diabetes Educators: www.diabeteseducator.org/patient-resources  This information is not intended to replace advice given to you by your health care provider. Make sure you discuss any questions you have with your health care provider. Document Released: 03/03/2016 Document Revised: 04/17/2016 Document Reviewed: 12/14/2015 Elsevier Interactive Patient Education  2018 [employer].   Diabetes Mellitus and Standards of Medical Care Managing diabetes (diabetes mellitus) can be complicated. Your diabetes treatment may be managed by a team of health care providers, including:  A diet and nutrition specialist (registered dietitian).  A nurse.  A certified diabetes educator (CDE).  A diabetes specialist (endocrinologist).  An eye doctor.  A primary care provider.  A dentist.  Your health care providers follow a schedule in order to help you get the best quality of care. The following schedule is a general guideline for your diabetes management plan. Your health care providers may also give you more specific instructions. HbA1c ( hemoglobin A1c) test This test provides information about blood sugar (glucose) control over the previous 2-3 months. It is used to check whether your diabetes management plan needs to be adjusted.  If you are meeting  your treatment goals, this test is done at least 2 times a year.  If you are not meeting treatment goals or if your treatment goals have changed, this test is done 4 times a year.  Blood pressure test  This test is done at every routine medical visit. For most people, the goal is less than 130/80. Ask your health care provider what your goal blood pressure should  be. Dental and eye exams  Visit your dentist two times a year.  If you have type 1 diabetes, get an eye exam 3-5 years after you are diagnosed, and then once a year after your first exam. ? If you were diagnosed with type 1 diabetes as a child, get an eye exam when you are age 31 or older and have had diabetes for 3-5 years. After the first exam, you should get an eye exam once a year.  If you have type 2 diabetes, have an eye exam as soon as you are diagnosed, and then once a year after your first exam. Foot care exam  Visual foot exams are done at every routine medical visit. The exams check for cuts, bruises, redness, blisters, sores, or other problems with the feet.  A complete foot exam is done by your health care provider once a year. This exam includes an inspection of the structure and skin of your feet, and a check of the pulses and sensation in your feet. ? Type 1 diabetes: Get your first exam 3-5 years after diagnosis. ? Type 2 diabetes: Get your first exam as soon as you are diagnosed.  Check your feet every day for cuts, bruises, redness, blisters, or sores. If you have any of these or other problems that are not healing, contact your health care provider. Kidney function test ( urine microalbumin)  This test is done once a year. ? Type 1 diabetes: Get your first test 5 years after diagnosis. ? Type 2 diabetes: Get your first test as soon as you are diagnosed.  If you have chronic kidney disease (CKD), get a serum creatinine and estimated glomerular filtration rate (eGFR) test once a year. Lipid profile (cholesterol, HDL, LDL, triglycerides)  This test should be done when you are diagnosed with diabetes, and every 5 years after the first test. If you are on medicines to lower your cholesterol, you may need to get this test done every year. ? The goal for LDL is less than 100 mg/dL (5.5 mmol/L). If you are at high risk, the goal is less than 70 mg/dL (3.9 mmol/L). ? The goal  for HDL is 40 mg/dL (2.2 mmol/L) for men and 50 mg/dL(2.8 mmol/L) for women. An HDL cholesterol of 60 mg/dL (3.3 mmol/L) or higher gives some protection against heart disease. ? The goal for triglycerides is less than 150 mg/dL (8.3 mmol/L). Immunizations  The yearly flu (influenza) vaccine is recommended for everyone 6 months or older who has diabetes.  The pneumonia (pneumococcal) vaccine is recommended for everyone 2 years or older who has diabetes. If you are 25 or older, you may get the pneumonia vaccine as a series of two separate shots.  The hepatitis B vaccine is recommended for adults shortly after they have been diagnosed with diabetes.  The Tdap (tetanus, diphtheria, and pertussis) vaccine should be given: ? According to normal childhood vaccination schedules, for children. ? Every 10 years, for adults who have diabetes.  The shingles vaccine is recommended for people who have had chicken pox and are 50  years or older. Mental and emotional health  Screening for symptoms of eating disorders, anxiety, and depression is recommended at the time of diagnosis and afterward as needed. If your screening shows that you have symptoms (you have a positive screening result), you may need further evaluation and be referred to a mental health care provider. Diabetes self-management education  Education about how to manage your diabetes is recommended at diagnosis and ongoing as needed. Treatment plan  Your treatment plan will be reviewed at every medical visit. Summary  Managing diabetes (diabetes mellitus) can be complicated. Your diabetes treatment may be managed by a team of health care providers.  Your health care providers follow a schedule in order to help you get the best quality of care.  Standards of care including having regular physical exams, blood tests, blood pressure monitoring, immunizations, screening tests, and education about how to manage your diabetes.  Your health  care providers may also give you more specific instructions based on your individual health. This information is not intended to replace advice given to you by your health care provider. Make sure you discuss any questions you have with your health care provider. Document Released: 09/07/2009 Document Revised: 08/08/2016 Document Reviewed: 08/08/2016 Elsevier Interactive Patient Education  2018 [employer].     IF you received an x-ray today, you will receive an invoice from [facility]. Please contact [facility] at [phone] with questions or concerns regarding your invoice.   IF you received labwork today, you will receive an invoice from [facility]. Please contact [facility] at [phone] with questions or concerns regarding your invoice.   Our billing staff will not be able to assist you with questions regarding bills from these companies.  You will be contacted with the lab results as soon as they are available. The fastest way to get your results is to activate your My Chart account. Instructions are located on the last page of this paperwork. If you have not heard from [place] regarding the results in 2 weeks, please contact this office.       I personally performed the services described in this documentation, which was scribed in my presence. The recorded information has been reviewed and considered for accuracy and completeness, addended by me as needed, and agree with information above.  Signed,   Merri Ray, MD [facility].  05/07/18 1:39 PM

## 2018-05-04 ENCOUNTER — Other Ambulatory Visit: Payer: 59

## 2018-05-04 LAB — COMPREHENSIVE METABOLIC PANEL
ALT: 20 IU/L (ref 0–44)
AST: 17 IU/L (ref 0–40)
Albumin/Globulin Ratio: 1.7 (ref 1.2–2.2)
Albumin: 4.7 g/dL (ref 3.5–5.5)
Alkaline Phosphatase: 54 IU/L (ref 39–117)
BUN/Creatinine Ratio: 15 (ref 9–20)
BUN: 16 mg/dL (ref 6–24)
Bilirubin Total: 0.5 mg/dL (ref 0.0–1.2)
CO2: 25 mmol/L (ref 20–29)
Calcium: 10 mg/dL (ref 8.7–10.2)
Chloride: 99 mmol/L (ref 96–106)
Creatinine, Ser: 1.1 mg/dL (ref 0.76–1.27)
GFR calc Af Amer: 88 mL/min/{1.73_m2} (ref >59)
GFR calc non Af Amer: 76 mL/min/{1.73_m2} (ref >59)
Globulin, Total: 2.7 g/dL (ref 1.5–4.5)
Glucose: 167 mg/dL — ABNORMAL HIGH (ref 65–99)
Potassium: 4.1 mmol/L (ref 3.5–5.2)
Sodium: 140 mmol/L (ref 134–144)
Total Protein: 7.4 g/dL (ref 6.0–8.5)

## 2018-05-04 LAB — PSA: Prostate Specific Ag, Serum: 1 ng/mL (ref 0.0–4.0)

## 2018-05-04 LAB — HEPATITIS C ANTIBODY: Hep C Virus Ab: <0.1 s/co ratio (ref 0.0–0.9)

## 2018-05-04 LAB — NMR, LIPOPROFILE
Cholesterol, Total: 207 mg/dL — ABNORMAL HIGH (ref 100–199)
HDL Particle Number: 35.8 umol/L (ref >=30.5)
HDL-C: 65 mg/dL (ref >39)
LDL Particle Number: 1522 nmol/L — ABNORMAL HIGH (ref <1000)
LDL Size: 21 nm (ref >20.5)
LDL-C: 126 mg/dL — ABNORMAL HIGH (ref 0–99)
LP-IR Score: 49 — ABNORMAL HIGH (ref <=45)
Small LDL Particle Number: 584 nmol/L — ABNORMAL HIGH (ref <=527)
Triglycerides: 78 mg/dL (ref 0–149)

## 2018-05-04 LAB — LIPID PANEL
Chol/HDL Ratio: 3.4 ratio (ref 0.0–5.0)
Cholesterol, Total: 201 mg/dL — ABNORMAL HIGH (ref 100–199)
HDL: 60 mg/dL (ref >39)
LDL Calculated: 126 mg/dL — ABNORMAL HIGH (ref 0–99)
Triglycerides: 73 mg/dL (ref 0–149)
VLDL Cholesterol Cal: 15 mg/dL (ref 5–40)

## 2018-05-04 LAB — HIV ANTIBODY (ROUTINE TESTING W REFLEX): HIV Screen 4th Generation wRfx: NONREACTIVE

## 2018-05-04 LAB — APOLIPOPROTEIN B: Apolipoprotein B: 102 mg/dL — ABNORMAL HIGH (ref <90)

## 2018-05-04 LAB — LIPOPROTEIN A (LPA): Lipoprotein (a): 61 nmol/L (ref <75)

## 2018-05-05 ENCOUNTER — Telehealth: Payer: Self-pay

## 2018-05-05 DIAGNOSIS — E785 Hyperlipidemia, unspecified: Secondary | ICD-10-CM

## 2018-05-05 MED ORDER — ATORVASTATIN CALCIUM 20 MG PO TABS: 20.0000 mg | ORAL_TABLET | Freq: Every day | ORAL | 3 refills | Status: DC

## 2018-05-05 MED FILL — ATORVASTATIN 20 MG TABLET: 20 | 90 days supply | Qty: 90 | Fill #0

## 2018-05-05 NOTE — Telephone Encounter (Signed)
Notes recorded by Phineas Semenobertson, Masao Junker, RN on 05/05/2018 at 9:50 AM EDT Pt made aware of lab results. Informed pt that Dr. Mayford Knifeurner recommends starting Lipitor 20 mg daily due to elevated LDL. Pt in agreement with treatment plan and scheduled for FLP and ALT on 06/28/18. He stated understanding and thankful for the call  Notes recorded by Quintella Reicherturner, Traci R, MD on 05/04/2018 at 10:01 PM EDT LDL and particle number too high - has mild nonobstructive CAD on coronary Cta - please start Lipitor 20mg  daily and repeat FLP and ALT in 6 weeks. With DM his LDL goal is < 70

## 2018-05-13 ENCOUNTER — Ambulatory Visit: Payer: 59 | Admitting: Family Medicine

## 2018-05-17 ENCOUNTER — Other Ambulatory Visit: Payer: Self-pay

## 2018-05-17 MED ORDER — GLUCOSE BLOOD VI STRP: ORAL_STRIP | 5 refills | Status: DC

## 2018-06-28 ENCOUNTER — Other Ambulatory Visit: Payer: 59 | Admitting: *Deleted

## 2018-06-28 DIAGNOSIS — E785 Hyperlipidemia, unspecified: Secondary | ICD-10-CM

## 2018-06-28 LAB — LIPID PANEL
Chol/HDL Ratio: 3.8 ratio (ref 0.0–5.0)
Cholesterol, Total: 205 mg/dL — ABNORMAL HIGH (ref 100–199)
HDL: 54 mg/dL (ref >39)
LDL Calculated: 134 mg/dL — ABNORMAL HIGH (ref 0–99)
Triglycerides: 86 mg/dL (ref 0–149)
VLDL Cholesterol Cal: 17 mg/dL (ref 5–40)

## 2018-06-28 LAB — ALT: ALT: 23 IU/L (ref 0–44)

## 2018-06-29 ENCOUNTER — Other Ambulatory Visit: Payer: Self-pay

## 2018-06-29 ENCOUNTER — Telehealth: Payer: Self-pay

## 2018-06-29 DIAGNOSIS — E785 Hyperlipidemia, unspecified: Secondary | ICD-10-CM

## 2018-06-29 MED ORDER — ATORVASTATIN CALCIUM 40 MG PO TABS: 40.0000 mg | ORAL_TABLET | Freq: Every day | ORAL | 3 refills | Status: DC

## 2018-06-29 MED FILL — ATORVASTATIN 40 MG TABLET: 40 | 90 days supply | Qty: 90 | Fill #0

## 2018-06-29 NOTE — Telephone Encounter (Signed)
-----   Message from Quintella Reichertraci R Turner, MD sent at 06/28/2018  6:38 PM EDT ----- LDL not at goal -increase Lipitor to 40 mg daily and repeat FLP and ALT in 6 weeks

## 2018-06-29 NOTE — Telephone Encounter (Signed)
Notes recorded by Sigurd Sosapp, Skarlette Lattner, RN on 06/29/2018 at 8:13 AM EDT Informed patient of lab results/recommendations. He verbalized understanding. ------

## 2018-07-01 ENCOUNTER — Telehealth: Payer: Self-pay | Admitting: Cardiology

## 2018-07-01 NOTE — Telephone Encounter (Signed)
New Message     *STAT* If patient is at the pharmacy, call can be transferred to refill team.   1. Which medications need to be refilled? (please list name of each medication and dose if known) atorvastatin (LIPITOR) 40 MG tablet and lisinopril (PRINIVIL,ZESTRIL) 10 MG tablet   2. Which pharmacy/location (including street and city if local pharmacy) is medication to be sent to? [facility] [place] - [id number]  3. Do they need a 30 day or 90 day supply? 90 day    Patient will be out of town for 3 months and is needing a 90 day supply.

## 2018-07-01 NOTE — Telephone Encounter (Signed)
Patient has ninety day refills on both of these medications at requested pharmacy. I called patient several times today to at call back number provided to discuss but the phone was turned off.

## 2018-07-02 ENCOUNTER — Telehealth: Payer: Self-pay | Admitting: Family Medicine

## 2018-07-02 NOTE — Telephone Encounter (Signed)
Copied from CRM [id number]. Topic: Quick Communication - See Telephone Encounter >> Jul 02, 2018 11:58 AM Arlyss Gandyichardson, Jamie-Lee Galdamez N, NT wrote: CRM for notification. See Telephone encounter for: 07/02/18. Pt requesting a call back from Dr. Neva SeatGreene. States he is frustrated about something with his last visit in June. Stated "when I ask for something, I don't get what I want." Also states he will call the corporate office. Pt states he would like a cal asap.

## 2018-07-07 NOTE — Telephone Encounter (Addendum)
Noted. I did see him in June for a physical but we also addressed other concerns at that time.  Did plan on follow-up in 2 weeks to review those symptoms (abdominal pain, back pain, fatigue) with further plan or workup if needed at that time. I do not see a follow up visit scheduled. Can we try to get more information on specific concern so I can see how to help him? I am not in office today but can reach him tomorrow or Friday if needed. Thanks.

## 2018-07-08 ENCOUNTER — Other Ambulatory Visit: Payer: Self-pay

## 2018-07-08 ENCOUNTER — Telehealth: Payer: Self-pay | Admitting: Family Medicine

## 2018-07-08 DIAGNOSIS — I10 Essential (primary) hypertension: Secondary | ICD-10-CM

## 2018-07-08 DIAGNOSIS — E11319 Type 2 diabetes mellitus with unspecified diabetic retinopathy without macular edema: Secondary | ICD-10-CM

## 2018-07-08 MED ORDER — METFORMIN HCL ER 500 MG PO TB24: 500.0000 mg | ORAL_TABLET | Freq: Two times a day (BID) | ORAL | 1 refills | Status: DC

## 2018-07-08 MED ORDER — SITAGLIPTIN PHOSPHATE 100 MG PO TABS: ORAL_TABLET | ORAL | 1 refills | Status: DC

## 2018-07-08 MED ORDER — LISINOPRIL 10 MG PO TABS
10.0000 mg | ORAL_TABLET | Freq: Every day | ORAL | 1 refills | Status: DC
Start: 2018-07-08 — End: 2019-01-21

## 2018-07-08 MED FILL — METFORMIN HCL ER 500 MG TAB: 500 | 90 days supply | Qty: 360 | Fill #0

## 2018-07-08 NOTE — Telephone Encounter (Signed)
Spoke with pt.  Pt had multiple complaints.  Advised he was supposed to return in 2 weeks to review his labs, further discuss his medications, etc, as it was written on his AVS.  Finally, he states he needed to be put back on Metformin XR 500mg  (2 tabs BID)  rather than Metformin 500mg  (1tab am, 2 tabs pm).  Advised I will discuss with Dr. Neva SeatGreene.  Pt also needed 90 day supply sent for Lisinopril and Januvia due to his traveling out of town for 90 days. Sent refills in for those 2 meds.   Dr. Neva SeatGreene reviewed and sent metformin XR to pt's [facility] pharmacy.

## 2018-07-08 NOTE — Telephone Encounter (Signed)
Patient called with concern regarding metformin.  Discussed with Rose.  He had been on metformin XR in the past and would prefer to restart at that medication.  Has adjusted dose at times due to diarrhea.  Metformin XR 500 mg prescribed with dosing of 1 to 2 tablets twice daily.  He is leaving for out of town, so 3667-month supply was provided.

## 2018-07-08 NOTE — Telephone Encounter (Signed)
Noted. Situation managed by his PCP.  I still do not see the follow up appointment as requested? Can this please be scheduled.

## 2018-07-09 ENCOUNTER — Other Ambulatory Visit: Payer: Self-pay | Admitting: Cardiology

## 2018-07-09 MED FILL — LISINOPRIL 10 MG TABLET: 10 | 90 days supply | Qty: 90 | Fill #0

## 2018-07-09 MED FILL — ATORVASTATIN CALCIUM 20 MG: 20 | 90 days supply | Qty: 90 | Fill #1

## 2018-07-09 MED FILL — glipiZIDE 10 MG TABS: 10 | 90 days supply | Qty: 180 | Fill #1

## 2018-07-09 MED FILL — JANUVIA 100 MG TABLET: 100 | 90 days supply | Qty: 90 | Fill #1

## 2018-07-16 ENCOUNTER — Telehealth: Payer: Self-pay | Admitting: Family Medicine

## 2018-08-13 ENCOUNTER — Other Ambulatory Visit: Payer: 59

## 2018-09-16 NOTE — Telephone Encounter (Signed)
done

## 2018-10-30 ENCOUNTER — Ambulatory Visit (INDEPENDENT_AMBULATORY_CARE_PROVIDER_SITE_OTHER): Payer: 59

## 2018-10-30 ENCOUNTER — Encounter (HOSPITAL_COMMUNITY): Payer: Self-pay | Admitting: Emergency Medicine

## 2018-10-30 ENCOUNTER — Ambulatory Visit (HOSPITAL_COMMUNITY)
Admission: EM | Admit: 2018-10-30 | Discharge: 2018-10-30 | Disposition: A | Discharge status: Home / Self Care | Payer: 59 | Attending: Family Medicine | Admitting: Family Medicine

## 2018-10-30 DIAGNOSIS — K5909 Other constipation: Secondary | ICD-10-CM

## 2018-10-30 DIAGNOSIS — E11319 Type 2 diabetes mellitus with unspecified diabetic retinopathy without macular edema: Secondary | ICD-10-CM

## 2018-10-30 DIAGNOSIS — E119 Type 2 diabetes mellitus without complications: Secondary | ICD-10-CM

## 2018-10-30 DIAGNOSIS — R109 Unspecified abdominal pain: Secondary | ICD-10-CM | POA: Diagnosis not present

## 2018-10-30 DIAGNOSIS — R739 Hyperglycemia, unspecified: Secondary | ICD-10-CM

## 2018-10-30 LAB — POCT URINALYSIS DIP (DEVICE)
Bilirubin Urine: NEGATIVE
Glucose, UA: 500 mg/dL — AB
Ketones, ur: NEGATIVE mg/dL
Leukocytes, UA: NEGATIVE
Nitrite: NEGATIVE
Protein, ur: NEGATIVE mg/dL
Specific Gravity, Urine: 1.015 (ref 1.005–1.030)
Urobilinogen, UA: 0.2 mg/dL (ref 0.0–1.0)
pH: 6.5 (ref 5.0–8.0)

## 2018-10-30 LAB — GLUCOSE, CAPILLARY: Glucose-Capillary: 333 mg/dL — ABNORMAL HIGH (ref 70–99)

## 2018-10-30 MED ORDER — KETOROLAC TROMETHAMINE 30 MG/ML IJ SOLN
INTRAMUSCULAR | Status: AC
  Filled 2018-10-30: qty 1

## 2018-10-30 MED ORDER — MELOXICAM 15 MG PO TABS: 15.0000 mg | ORAL_TABLET | Freq: Every day | ORAL | 1 refills | Status: DC

## 2018-10-30 MED ORDER — ONETOUCH ULTRA 2 W/DEVICE KIT: PACK | 0 refills | Status: DC

## 2018-10-30 MED ORDER — INSULIN PEN NEEDLE 30G X 8 MM MISC: 1.0000 | 3 refills | Status: DC | PRN

## 2018-10-30 MED ORDER — KETOROLAC TROMETHAMINE 30 MG/ML IJ SOLN
30.0000 mg | Freq: Once | INTRAMUSCULAR | Status: AC
  Administered 2018-10-30: 30 mg via INTRAMUSCULAR

## 2018-10-30 MED ORDER — INSULIN GLARGINE 100 UNIT/ML ~~LOC~~ SOLN: 10.0000 [IU] | Freq: Every day | SUBCUTANEOUS | 0 refills | Status: DC

## 2018-10-30 MED ORDER — POLYETHYLENE GLYCOL 3350 17 G PO PACK: 17.0000 g | PACK | Freq: Every day | ORAL | 0 refills | Status: DC

## 2018-10-30 MED ORDER — GLIPIZIDE 10 MG PO TABS: ORAL_TABLET | ORAL | 1 refills | Status: DC

## 2018-10-30 NOTE — ED Notes (Signed)
Urine in lab 

## 2018-10-30 NOTE — ED Provider Notes (Signed)
[name]    CSN: [id number] Arrival date & time: 10/30/18  1528     History   Chief Complaint Chief Complaint  Patient presents with  . Back Pain    HPI Jamie Foster is a 52 y.o. male.   HPI  Patient presents today with a complaint of bilateral back pain and flank pain.  He endorses associated urinary frequency occurring acutely today.  He has had no injury to his back.  Although he reports that when he sits for prolonged period of time he often develops stiffness and pain with positional changes.  He also has a history of renal calculi and is also concern for redevelopment of renal stones.  He has been afebrile and has not experienced visible hematuria.  On exam today his urine dipstick is significant for greater than 500 of glucose.  He is noncompliant admittedly with diabetes regimen.  He is under the care of Dr. Nyoka Cowden at primary care [place] along with he sees endocrinology Dr. Alfonzo Beers which he reports he is no longer seeing either provider. His last A1c was grossly abnormal at 9.1.  He endorses polyuria only. Past Medical History:  Diagnosis Date  . Allergy   . Diabetes mellitus without complication ([place])   . H pylori ulcer   . Hyperlipidemia   . Hypertension   . Kidney stone   . Sleep apnea     Patient Active Problem List   Diagnosis Date Noted  . Chest pain 03/18/2017  . Uncontrolled type 2 diabetes mellitus with retinopathy, without long-term current use of insulin ([place]) 12/21/2015  . Family hx of prostate cancer 06/22/2013  . Allergic conjunctivitis 06/22/2013  . Allergic rhinitis 06/22/2013  . OSA (obstructive sleep apnea) 10/06/2012  . BMI 30.0-30.9,adult 07/19/2012  . HTN (hypertension) 07/19/2012  . GERD (gastroesophageal reflux disease) 07/19/2012  . DDD (degenerative disc disease), cervical 07/19/2012  . Sinusitis, chronic 07/19/2012    Past Surgical History:  Procedure Laterality Date  . NASAL SINUS SURGERY         Home  Medications    Prior to Admission medications   Medication Sig Start Date End Date Taking? Authorizing Provider  atorvastatin (LIPITOR) 40 MG tablet Take 1 tablet (40 mg total) by mouth daily. 06/29/18   Sueanne Margarita, MD  Blood Glucose Monitoring Suppl (ONE TOUCH ULTRA 2) W/DEVICE KIT Use to test blood sugar daily as instructed. 01/24/15   Philemon Kingdom, MD  glipiZIDE (GLUCOTROL) 10 MG tablet TAKE 1 TABLET (10 MG TOTAL) BY MOUTH 2 (TWO) TIMES DAILY BEFORE A MEAL. 05/03/18   Wendie Agreste, MD  glucose blood (TRUETRACK TEST) test strip Use 2x a day 05/17/18   Wendie Agreste, MD  lisinopril (PRINIVIL,ZESTRIL) 10 MG tablet Take 1 tablet (10 mg total) by mouth daily. 07/08/18   Wendie Agreste, MD  metFORMIN (GLUCOPHAGE XR) 500 MG 24 hr tablet Take 1-2 tablets (500-1,000 mg total) by mouth 2 (two) times daily. 07/08/18   Wendie Agreste, MD  [facility] DELICA LANCETS 40N MISC Use to test blood sugar 2 times daily as instructed. 01/24/15   Philemon Kingdom, MD  sitaGLIPtin (JANUVIA) 100 MG tablet TAKE 1 TABLET (100 MG TOTAL) BY MOUTH DAILY. 07/08/18   Wendie Agreste, MD  [facility] LANCETS MISC Use 2x a day - Tru Track 06/09/14   Philemon Kingdom, MD    Family History Family History  Problem Relation Age of Onset  . Hypertension Mother   . Prostate cancer Father   .  Colon cancer Neg Hx   . Colon polyps Neg Hx     Social History Social History   Tobacco Use  . Smoking status: Never Smoker  . Smokeless tobacco: Never Used  Substance Use Topics  . Alcohol use: No  . Drug use: No     Allergies   Patient has no known allergies.   Review of Systems Review of Systems Pertinent negatives listed in HPI  Physical Exam Triage Vital Signs ED Triage Vitals [10/30/18 1803]  Enc Vitals Group     BP (!) 149/100     Pulse Rate 71     Resp 16     Temp 97.8 F (36.6 C)     Temp src      SpO2 100 %     Weight      Height      Head Circumference      Peak Flow      Pain Score  10     Pain Loc      Pain Edu?      Excl. in [place]?    No data found.  Updated Vital Signs BP (!) 149/100   Pulse 71   Temp 97.8 F (36.6 C)   Resp 16   SpO2 100%   Visual Acuity Right Eye Distance:   Left Eye Distance:   Bilateral Distance:    Right Eye Near:   Left Eye Near:    Bilateral Near:     Physical Exam Physical Exam: Constitutional: Patient appears well-developed and well-nourished. No distress. HENT: Normocephalic, atraumatic, External right and left ear normal. Oropharynx is clear and moist.  Eyes: Conjunctivae and EOM are normal. PERRLA, no scleral icterus. Neck: Normal ROM. Neck supple. No JVD. No tracheal deviation. No thyromegaly. CVS: RRR, S1/S2 +, no murmurs, no gallops, no carotid bruit.  Pulmonary: Effort and breath sounds normal, no stridor, rhonchi, wheezes, rales.  Abdominal: Soft. BS +, no distension, tenderness, rebound or guarding. Flank tenderness bilaterally  Musculoskeletal: bony tenderness lumbosacral region, patellar tendon reflexes +2 Lymphadenopathy: No lymphadenopathy noted, cervical, inguinal or axillary Neuro: Alert. Normal reflexes, muscle tone coordination. No cranial nerve deficit. Skin: Skin is warm and dry. No rash noted. Not diaphoretic. No erythema. No pallor. Psychiatric: Normal mood and affect. Behavior, judgment, thought content normal.  UC Treatments / Results  Labs (all labs ordered are listed, but only abnormal results are displayed) Labs Reviewed  GLUCOSE, CAPILLARY - Abnormal; Notable for the following components:      Result Value   Glucose-Capillary 333 (*)    All other components within normal limits  POCT URINALYSIS DIP (DEVICE) - Abnormal; Notable for the following components:   Glucose, UA 500 (*)    Hgb urine dipstick TRACE (*)    All other components within normal limits  CBG MONITORING, ED    EKG None  Radiology No results found.  Procedures Procedures (including critical care time)  Medications  Ordered in UC Medications - No data to display  Initial Impression / Assessment and Plan / UC Course  I have reviewed the triage vital signs and the nursing notes.  Pertinent labs & imaging results that were available during my care of the patient were reviewed by me and considered in my medical decision making (see chart for details).     Final Clinical Impressions(s) / UC Diagnoses   Final diagnoses:  Bilateral flank pain  Hyperglycemia  Type 2 diabetes mellitus without complication, without long-term current use of insulin ([place])  Patient was deferred to the ER due to hyperglycemia, however declined as he is about to travel outside of the country for an extended 6-week stay in [place].  Therefore I have started him on a long acting insulin and advised him to follow-up in my primary care office upon [place].  Checked a urinalysis patient had gross glucose along with a trace of hematuria therefore obtained a KUB to rule out a renal pathology.  KUB was negative.  Therefore polyuria is likely related to hyperglycemia.  Current CBG was 333 in office.  Patient was advised to pick up Lantus and resume oral antidiabetic medication as previously prescribed by his primary care provider.  Given now starting him on Lantus I did reduce his glipizide to 10 mg once daily opposed to twice daily.   For low back pain, patient was given a Toradol shot here in office.  Injection was tolerated.  Patient verbalized understanding and agreement with plan.  Discharge Instructions     Highly recommend that you follow-up at the ER prior to her travels for further evaluation of your elevated blood sugar.   -However I will start you on Lantus 10 units once daily at bedtime. -Also I am changing your glipizide to 10 mg once daily with breakfast -Continue metformin daily as prescribed and do not skip doses.  X-ray is negative for renal stones.  However shows a moderate amount of stool.  I  will scribed MiraLAX for you to take as needed to lessen stool burden.  I have also refilled her meloxicam which I encourage you to take once daily for back pain.      ED Prescriptions    Medication Sig Dispense Auth. Provider   Blood Glucose Monitoring Suppl (ONE TOUCH ULTRA 2) w/Device KIT Use to test blood sugar daily as instructed. ICD 10 E.11.8 1 each Scot Jun, FNP   glipiZIDE (GLUCOTROL) 10 MG tablet TAKE 1 TABLET (10 MG TOTAL) BY MOUTH ONCE DAILY WITH BREAKFAST 180 tablet Scot Jun, FNP   insulin glargine (LANTUS) 100 UNIT/ML injection Inject 0.1 mLs (10 Units total) into the skin daily. 10 mL Scot Jun, FNP   Insulin Pen Needle (NOVOFINE) 30G X 8 MM MISC Inject 10 each into the skin as needed. 100 each Scot Jun, FNP   meloxicam (MOBIC) 15 MG tablet Take 1 tablet (15 mg total) by mouth daily. 30 tablet Scot Jun, FNP   polyethylene glycol (MIRALAX / GLYCOLAX) packet Take 17 g by mouth daily. [place] each Scot Jun, [place], [place], FNP 11/03/18 [phone]

## 2018-10-30 NOTE — Discharge Instructions (Addendum)
Highly recommend that you follow-up at the ER prior to her travels for further evaluation of your elevated blood sugar.   -However I will start you on Lantus 10 units once daily at bedtime. -Also I am changing your glipizide to 10 mg once daily with breakfast -Continue metformin daily as prescribed and do not skip doses.  X-ray is negative for renal stones.  However shows a moderate amount of stool.  I will scribed MiraLAX for you to take as needed to lessen stool burden.  I have also refilled her meloxicam which I encourage you to take once daily for back pain.

## 2018-10-30 NOTE — ED Triage Notes (Signed)
Pt c/o lower back spasms starting yesterday, hurts worse with bending over.

## 2019-01-20 ENCOUNTER — Ambulatory Visit: Payer: 59 | Admitting: Internal Medicine

## 2019-01-20 ENCOUNTER — Encounter: Payer: Self-pay | Admitting: Internal Medicine

## 2019-01-20 ENCOUNTER — Other Ambulatory Visit: Payer: Self-pay

## 2019-01-20 ENCOUNTER — Telehealth: Payer: Self-pay | Admitting: Cardiology

## 2019-01-20 VITALS — BP 150/80 | HR 90 | Ht 75.0 in | Wt 232.0 lb

## 2019-01-20 DIAGNOSIS — IMO0002 Reserved for concepts with insufficient information to code with codable children: Secondary | ICD-10-CM

## 2019-01-20 DIAGNOSIS — E1165 Type 2 diabetes mellitus with hyperglycemia: Secondary | ICD-10-CM

## 2019-01-20 DIAGNOSIS — E11319 Type 2 diabetes mellitus with unspecified diabetic retinopathy without macular edema: Secondary | ICD-10-CM | POA: Diagnosis not present

## 2019-01-20 LAB — POCT GLYCOSYLATED HEMOGLOBIN (HGB A1C): Hemoglobin A1C: 11.6 % — AB (ref 4.0–5.6)

## 2019-01-20 MED ORDER — INSULIN GLARGINE 100 UNIT/ML SOLOSTAR PEN: 15.0000 [IU] | PEN_INJECTOR | Freq: Every day | SUBCUTANEOUS | 3 refills | Status: DC

## 2019-01-20 MED ORDER — GLIPIZIDE 5 MG PO TABS: ORAL_TABLET | ORAL | 3 refills | Status: DC

## 2019-01-20 MED ORDER — INSULIN PEN NEEDLE 32G X 4 MM MISC: 3 refills | Status: DC

## 2019-01-20 MED ORDER — METFORMIN HCL ER 500 MG PO TB24: 1000.0000 mg | ORAL_TABLET | Freq: Two times a day (BID) | ORAL | 3 refills | Status: DC

## 2019-01-20 MED FILL — LANTUS SOLOSTAR 100 UNITS/M: 100 | 80 days supply | Qty: 12 | Fill #0

## 2019-01-20 MED FILL — glipiZIDE 5 MG TABS: 5 | 90 days supply | Qty: 270 | Fill #0

## 2019-01-20 MED FILL — ULTICARE PEN NDL 4MM 32G: 32G X 4 MM | 90 days supply | Qty: 100 | Fill #0

## 2019-01-20 MED FILL — metFORMIN HCL ER 500 MG TB2: 500 | 90 days supply | Qty: 360 | Fill #0

## 2019-01-20 NOTE — Patient Instructions (Addendum)
Please change: - Metformin to ER 1000 mg 2x a day  Decrease: - Glipizide to 5 mg in am before b'fast and 10 mg before dinner  Please restart: - Lantus 10 units at bedtime and increase by 2 units every 3 days until sugars in am are <140

## 2019-01-20 NOTE — Progress Notes (Signed)
Patient ID: Jamie Foster, male   DOB: 18-Aug-1966, 53 y.o.   MRN: [id number]   HPI: Jamie Foster is a 52 y.o.-year-old male, returning for f/u for DM2, dx 2006, non-insulin-dependent, uncontrolled, with complications (DR). Last visit 2 years and 2 months ago.  At last visit he told the schedulers that he will not be coming back and will try to schedule an appointment with another provider.  However, he recently called tochedule another appointment with me.  He is here with his wife who offers part of the history.  He would like to get refills of his medicines as he is preparing to go out of town for 3 months.  Last hemoglobin A1c was: Lab Results  Component Value Date   HGBA1C 9.1 (A) 05/03/2018   HGBA1C 10.3 10/27/2016   HGBA1C 8.1 08/07/2016  He had 1 steroid inj in his R shoulder this year and 1 last year. He also had a prednisone taper 12/2013.   Pt was on a regimen of: - Metformin ER 1000 mg twice a day - Januvia 100 mg in a.m. - Glipizide 10 mg twice a day He tried Invokana 100 mg in am in 03/2015 >> increased urination and urgency >> had to stop. He was JanuMet 50/1000 mg bid >> diarrhea and abdominal pain, mm weakness >> stopped.  He is now on: -Metformin 1 to 2 tablets twice daily >> GI upset -Glipizide 10 mg twice a day  Pt checks his sugars sporadically -No Log, no meter: - am:  112 (he does not feel good at this level)-146 >> 120-140 >> 190-220 - 2h after b'fast: n/c >> 180s >> n/c - before lunch: n/c >> 60-70's -seldom - 2h after lunch: n/c >> 150-200 >> n/c - before dinner: 111-120 >> 170-180, 220 >> n/c - 2h after dinner:n/c >> 1x 368 >> n/c - bedtime:sugars: 180-240 >> 110, 196 >> n/c >> 230s  He has a TrueTrack and a OneTouch Ultra meter glucometer.  -+ Mild CKD, last BUN/creatinine:  Lab Results  Component Value Date   BUN 16 05/03/2018   CREATININE 1.10 05/03/2018  On lisinopril 10. -+ HL, last set of lipids: Lab Results  Component Value Date   CHOL  205 (H) 06/28/2018   HDL 54 06/28/2018   LDLCALC 134 (H) 06/28/2018   TRIG 86 06/28/2018   CHOLHDL 3.8 06/28/2018  On Lipitor 40. - last eye exam was in 11/2014: + DR OU -He denies numbness and tingling in his feet.  He also has a history of OSA, GERD, HTN, H pylori ulcer.  ROS: Constitutional: no weight gain/no weight loss, + fatigue, no subjective hyperthermia, no subjective hypothermia, + nocturia Eyes: + Blurry vision, no xerophthalmia ENT: no sore throat, no nodules palpated in neck, no dysphagia, no odynophagia, + hoarseness Cardiovascular: no CP/no SOB/no palpitations/no leg swelling Respiratory: no cough/no SOB/no wheezing Gastrointestinal: no N/no V/no D/no C/no acid reflux Musculoskeletal: no muscle aches/no joint aches Skin: no rashes, no hair loss Neurological: no tremors/no numbness/no tingling/no dizziness  I reviewed pt's medications, allergies, PMH, social hx, family hx, and changes were documented in the history of present illness. Otherwise, unchanged from my initial visit note.  Past Medical History:  Diagnosis Date  . Allergy   . Diabetes mellitus without complication ([place])   . H pylori ulcer   . Hyperlipidemia   . Hypertension   . Kidney stone   . Sleep apnea    Past Surgical History:  Procedure Laterality Date  .  NASAL SINUS SURGERY     Social History   Socioeconomic History  . Marital status: Married    Spouse name: Not on file  . Number of children: Not on file  . Years of education: Not on file  . Highest education level: Not on file  Occupational History  . Not on file  Social Needs  . Financial resource strain: Not on file  . Food insecurity:    Worry: Not on file    Inability: Not on file  . Transportation needs:    Medical: Not on file    Non-medical: Not on file  Tobacco Use  . Smoking status: Never Smoker  . Smokeless tobacco: Never Used  Substance and Sexual Activity  . Alcohol use: No  . Drug use: No  . Sexual activity:  Yes  Lifestyle  . Physical activity:    Days per week: Not on file    Minutes per session: Not on file  . Stress: Not on file  Relationships  . Social connections:    Talks on phone: Not on file    Gets together: Not on file    Attends religious service: Not on file    Active member of club or organization: Not on file    Attends meetings of clubs or organizations: Not on file    Relationship status: Not on file  . Intimate partner violence:    Fear of current or ex partner: Not on file    Emotionally abused: Not on file    Physically abused: Not on file    Forced sexual activity: Not on file  Other Topics Concern  . Not on file  Social History Narrative   Married.Education: College/Other. Exercise: Walks.   Current Outpatient Medications on File Prior to Visit  Medication Sig Dispense Refill  . atorvastatin (LIPITOR) 40 MG tablet Take 1 tablet (40 mg total) by mouth daily. 90 tablet 3  . Blood Glucose Monitoring Suppl (ONE TOUCH ULTRA 2) w/Device KIT Use to test blood sugar daily as instructed. ICD 10 E.11.8 1 each 0  . glipiZIDE (GLUCOTROL) 10 MG tablet TAKE 1 TABLET (10 MG TOTAL) BY MOUTH ONCE DAILY WITH BREAKFAST 180 tablet 1  . glucose blood (TRUETRACK TEST) test strip Use 2x a day 100 each 5  . insulin glargine (LANTUS) 100 UNIT/ML injection Inject 0.1 mLs (10 Units total) into the skin daily. 10 mL 0  . Insulin Pen Needle (NOVOFINE) 30G X 8 MM MISC Inject 10 each into the skin as needed. 100 each 3  . lisinopril (PRINIVIL,ZESTRIL) 10 MG tablet Take 1 tablet (10 mg total) by mouth daily. 90 tablet 1  . meloxicam (MOBIC) 15 MG tablet Take 1 tablet (15 mg total) by mouth daily. 30 tablet 1  . metFORMIN (GLUCOPHAGE XR) 500 MG 24 hr tablet Take 1-2 tablets (500-1,000 mg total) by mouth 2 (two) times daily. 360 tablet 1  . ONETOUCH DELICA LANCETS 53G MISC Use to test blood sugar 2 times daily as instructed. 100 each 11  . polyethylene glycol (MIRALAX / GLYCOLAX) packet Take 17 g  by mouth daily. 14 each 0  . sitaGLIPtin (JANUVIA) 100 MG tablet TAKE 1 TABLET (100 MG TOTAL) BY MOUTH DAILY. 90 tablet 1  . [employer] LANCETS MISC Use 2x a day - Tru Track 200 each 5   Current Facility-Administered Medications on File Prior to Visit  Medication Dose Route Frequency Provider Last Rate Last Dose  . 0.9 %  sodium chloride infusion  500 mL Intravenous Continuous Milus Banister, MD       No Known Allergies Family History  Problem Relation Age of Onset  . Hypertension Mother   . Prostate cancer Father   . Colon cancer Neg Hx   . Colon polyps Neg Hx     PE: BP (!) 150/80   Pulse 90   Ht '6\' 3"'$  (1.905 m)   Wt 232 lb (105.2 kg)   SpO2 94%   BMI 29.00 kg/m  Body mass index is 29 kg/m.  Wt Readings from Last 3 Encounters:  01/20/19 232 lb (105.2 kg)  05/03/18 224 lb 3.2 oz (101.7 kg)  03/09/18 227 lb (103 kg)   Constitutional: overweight, in NAD Eyes: PERRLA, EOMI, no exophthalmos ENT: moist mucous membranes, no thyromegaly, no cervical lymphadenopathy Cardiovascular: RRR, No MRG Respiratory: CTA B Gastrointestinal: abdomen soft, NT, ND, BS+ Musculoskeletal: no deformities, strength intact in all 4 Skin: moist, warm, no rashes Neurological: no tremor with outstretched hands, DTR normal in all 4  ASSESSMENT: 1. DM2, non-insulin-dependent (refused insulin), uncontrolled, with complications - DR  PLAN:  1. Patient with longstanding, uncontrolled, type 2 diabetes, on oral antidiabetic regimen, with worsening control at last visit, usually noncompliant with the recommended regimen.  He repeatedly refused insulin in the past and also refused adding any other medicine.  I also repeatedly advised him to write sugars down but he refused. - At last visit, we discussed that since he is not ready to accept my advice about his diabetes care, I did not feel I can help him.  He left asking not to be scheduled for another appointment as he wanted to establish care somewhere  else.  However, he now returns more than 3 years after his previous appointment.  He needs a new diabetes plan and supplies as he is preparing to go out of town for 3 months. -His most recent HbA1c from 04/2018 was reviewed and this was still high, at 9.1%. Today, HbA1c is 11.6% (higher) -We discussed about the severity of his diabetes, greatly increasing his last visit and putting him at risk for complications. -He is currently on metformin instant release which she is not tolerating well due to abdominal pain.  We will change this back to metformin ER.  He is taking glipizide 10 mg twice a day, but feels that he is dropping his sugars in the middle of the morning occasionally to 60s or 70s.  This is not seldom, but he feels these drops.  We will decrease the glipizide in the morning to only 5 mg.  We will keep the dose at night at 10 mg.  He is on Januvia for now and I do not feel that this is strong enough to restart it now.  We did discuss about starting insulin.  He was on Lantus for short period of time when he was traveling and his sugars were very high, but he stopped this.  He agrees to restart it.  I advised him to start at a low dose and advance as tolerated based on morning sugars.  I sent supplies for the insulin pen and pen needles to his pharmacy. - I advised him to:  Patient Instructions  Please change: - Metformin to ER 1000 mg 2x a day  Decrease: - Glipizide to 5 mg in am before b'fast and 10 mg before dinner  Please restart: - Lantus 10 units at bedtime and increase by 2 units every 3 days until sugars in am  are <140  - Check sugars at different times of the day - check 1x a day, rotating checks - advised for yearly eye exams >> he is not UTD - We did discuss that this would be our last appointment as I believe he would definitely benefit from seeing another provider.  He agrees with the plan.  I would be here to refill prescriptions until he schedules an appointment with the  other endocrinologist and also I advised him to let me know if we need to titrate the above medications.  - time spent with the patient: 25 minutes, of which >50% was spent in obtaining information about his symptoms, reviewing his previous labs, evaluations, and treatments, counseling him and his wife about his condition (please see the discussed topics above), and developing a plan to further investigate and treat it; we came up with a treatment plan and I refilled all of his prescriptions.  Philemon Kingdom, MD PhD [facility] Endocrinology

## 2019-01-20 NOTE — Addendum Note (Signed)
Addended by: Darliss Ridgel I on: 01/20/2019 04:14 PM   Modules accepted: Orders

## 2019-01-20 NOTE — Telephone Encounter (Signed)
New Message   Patient is requesting a call back to discuss his cholesterol test. Please call to discuss.

## 2019-01-21 ENCOUNTER — Encounter: Payer: Self-pay | Admitting: Emergency Medicine

## 2019-01-21 ENCOUNTER — Ambulatory Visit: Payer: 59 | Admitting: Emergency Medicine

## 2019-01-21 ENCOUNTER — Ambulatory Visit (INDEPENDENT_AMBULATORY_CARE_PROVIDER_SITE_OTHER): Payer: 59 | Admitting: Emergency Medicine

## 2019-01-21 ENCOUNTER — Other Ambulatory Visit: Payer: Self-pay

## 2019-01-21 VITALS — BP 126/78 | HR 87 | Temp 97.9°F | Resp 16 | Wt 228.6 lb

## 2019-01-21 DIAGNOSIS — M545 Low back pain, unspecified: Secondary | ICD-10-CM

## 2019-01-21 DIAGNOSIS — E785 Hyperlipidemia, unspecified: Secondary | ICD-10-CM

## 2019-01-21 DIAGNOSIS — I10 Essential (primary) hypertension: Secondary | ICD-10-CM

## 2019-01-21 MED ORDER — CYCLOBENZAPRINE HCL 10 MG PO TABS: 10.0000 mg | ORAL_TABLET | Freq: Every day | ORAL | 0 refills | Status: DC

## 2019-01-21 MED ORDER — ATORVASTATIN CALCIUM 40 MG PO TABS: 40.0000 mg | ORAL_TABLET | Freq: Every day | ORAL | 3 refills | Status: DC

## 2019-01-21 MED ORDER — LISINOPRIL 10 MG PO TABS: 10.0000 mg | ORAL_TABLET | Freq: Every day | ORAL | 1 refills | Status: DC

## 2019-01-21 MED ORDER — MELOXICAM 15 MG PO TABS: 15.0000 mg | ORAL_TABLET | Freq: Every day | ORAL | 1 refills | Status: DC

## 2019-01-21 MED FILL — MELOXICAM 15 MG TABLET: 15 | 30 days supply | Qty: 30 | Fill #0

## 2019-01-21 MED FILL — LISINOPRIL 10 MG TABLET: 10 | 90 days supply | Qty: 90 | Fill #0

## 2019-01-21 MED FILL — ATORVASTATIN 40 MG TABLET: 40 | 90 days supply | Qty: 90 | Fill #0

## 2019-01-21 MED FILL — CYCLOBENZAPRINE HCL 10 MG T: 10 | 30 days supply | Qty: 30 | Fill #0

## 2019-01-21 NOTE — Progress Notes (Signed)
Jamie Foster 53 y.o.   Chief Complaint  Patient presents with  . Flank Pain    x 2 months comes/goes, BOTH    HISTORY OF PRESENT ILLNESS: This is a 53 y.o. male complaining of lumbar pain for 2 months.  Pain worse when he travels.  No pain now.  Usually brought on by positioning or lifting the wrong way.  Back Pain  This is a recurrent problem. The current episode started more than 1 month ago. The problem occurs every several days. The problem has been waxing and waning since onset. The pain is present in the lumbar spine. The quality of the pain is described as aching. The pain does not radiate. The pain is at a severity of 6/10. The pain is moderate. The symptoms are aggravated by bending, position and twisting. Stiffness is present in the morning. Pertinent negatives include no abdominal pain, bladder incontinence, bowel incontinence, chest pain, dysuria, fever, headaches, leg pain, numbness, paresthesias, pelvic pain, perianal numbness, tingling, weakness or weight loss. Risk factors: none. He has tried heat and ice for the symptoms. The treatment provided no relief.     Prior to Admission medications   Medication Sig Start Date End Date Taking? Authorizing Provider  atorvastatin (LIPITOR) 40 MG tablet Take 1 tablet (40 mg total) by mouth daily. 01/21/19  Yes Sir Mallis, Ines Bloomer, MD  Blood Glucose Monitoring Suppl (ONE TOUCH ULTRA 2) w/Device KIT Use to test blood sugar daily as instructed. ICD 10 E.11.8 10/30/18  Yes Scot Jun, FNP  glipiZIDE (GLUCOTROL) 5 MG tablet Use 5 mg in am and 10 mg before dinner 01/20/19  Yes Philemon Kingdom, MD  glucose blood (TRUETRACK TEST) test strip Use 2x a day 05/17/18  Yes Wendie Agreste, MD  Insulin Glargine (LANTUS SOLOSTAR) 100 UNIT/ML Solostar Pen Inject 15 Units into the skin daily. 01/20/19  Yes Philemon Kingdom, MD  Insulin Pen Needle 32G X 4 MM MISC Use 1x a day 01/20/19  Yes Philemon Kingdom, MD  lisinopril (PRINIVIL,ZESTRIL)  10 MG tablet Take 1 tablet (10 mg total) by mouth daily. 01/21/19  Yes Shanicka Oldenkamp, Ines Bloomer, MD  metFORMIN (GLUCOPHAGE-XR) 500 MG 24 hr tablet Take 2 tablets (1,000 mg total) by mouth 2 (two) times daily. 01/20/19  Yes Philemon Kingdom, MD  [facility] DELICA LANCETS 96P MISC Use to test blood sugar 2 times daily as instructed. 01/24/15  Yes Philemon Kingdom, MD  [facility] LANCETS MISC Use 2x a day - Tru Track 06/09/14  Yes Philemon Kingdom, MD  cyclobenzaprine (FLEXERIL) 10 MG tablet Take 1 tablet (10 mg total) by mouth at bedtime. 01/21/19   Horald Pollen, MD  meloxicam (MOBIC) 15 MG tablet Take 1 tablet (15 mg total) by mouth daily. 01/21/19   Horald Pollen, MD  polyethylene glycol [facility] / Floria Raveling) packet Take 17 g by mouth daily. Patient not taking: Reported on 01/21/2019 10/30/18   Scot Jun, FNP  sitaGLIPtin (JANUVIA) 100 MG tablet TAKE 1 TABLET (100 MG TOTAL) BY MOUTH DAILY. Patient not taking: Reported on 01/21/2019 07/08/18   Wendie Agreste, MD    No Known Allergies  Patient Active Problem List   Diagnosis Date Noted  . Uncontrolled type 2 diabetes mellitus with retinopathy, without long-term current use of insulin ([place]) 12/21/2015  . Family hx of prostate cancer 06/22/2013  . OSA (obstructive sleep apnea) 10/06/2012  . BMI 30.0-30.9,adult 07/19/2012  . HTN (hypertension) 07/19/2012  . GERD (gastroesophageal reflux disease) 07/19/2012  . DDD (degenerative  disc disease), cervical 07/19/2012    Past Medical History:  Diagnosis Date  . Allergy   . Diabetes mellitus without complication (HCC)   . H pylori ulcer   . Hyperlipidemia   . Hypertension   . Kidney stone   . Sleep apnea     Past Surgical History:  Procedure Laterality Date  . NASAL SINUS SURGERY      Social History   Socioeconomic History  . Marital status: Married    Spouse name: Not on file  . Number of children: Not on file  . Years of education: Not on file  . Highest education  level: Not on file  Occupational History  . Not on file  Social Needs  . Financial resource strain: Not on file  . Food insecurity:    Worry: Not on file    Inability: Not on file  . Transportation needs:    Medical: Not on file    Non-medical: Not on file  Tobacco Use  . Smoking status: Never Smoker  . Smokeless tobacco: Never Used  Substance and Sexual Activity  . Alcohol use: No  . Drug use: No  . Sexual activity: Yes  Lifestyle  . Physical activity:    Days per week: Not on file    Minutes per session: Not on file  . Stress: Not on file  Relationships  . Social connections:    Talks on phone: Not on file    Gets together: Not on file    Attends religious service: Not on file    Active member of club or organization: Not on file    Attends meetings of clubs or organizations: Not on file    Relationship status: Not on file  . Intimate partner violence:    Fear of current or ex partner: Not on file    Emotionally abused: Not on file    Physically abused: Not on file    Forced sexual activity: Not on file  Other Topics Concern  . Not on file  Social History Narrative   Married.Education: College/Other. Exercise: Walks.    Family History  Problem Relation Age of Onset  . Hypertension Mother   . Prostate cancer Father   . Colon cancer Neg Hx   . Colon polyps Neg Hx      Review of Systems  Constitutional: Negative.  Negative for chills, fever and weight loss.  HENT: Negative.   Eyes: Negative.   Respiratory: Negative.  Negative for cough and shortness of breath.   Cardiovascular: Negative.  Negative for chest pain, palpitations, claudication and leg swelling.  Gastrointestinal: Negative.  Negative for abdominal pain, bowel incontinence, diarrhea, nausea and vomiting.  Genitourinary: Negative.  Negative for bladder incontinence, dysuria, hematuria and pelvic pain.  Musculoskeletal: Positive for back pain. Negative for joint pain.  Skin: Negative.  Negative for  rash.  Neurological: Negative for dizziness, tingling, focal weakness, weakness, numbness, headaches and paresthesias.  Endo/Heme/Allergies: Negative.   All other systems reviewed and are negative.  Vitals:   01/21/19 1710  BP: 126/78  Pulse: 87  Resp: 16  Temp: 97.9 F (36.6 C)  SpO2: 97%    Physical Exam Vitals signs reviewed.  Constitutional:      Appearance: Normal appearance.  HENT:     Head: Normocephalic and atraumatic.  Eyes:     Extraocular Movements: Extraocular movements intact.     Conjunctiva/sclera: Conjunctivae normal.     Pupils: Pupils are equal, round, and reactive to light.  Neck:       Musculoskeletal: Normal range of motion and neck supple.  Cardiovascular:     Rate and Rhythm: Normal rate and regular rhythm.     Heart sounds: Normal heart sounds.  Pulmonary:     Effort: Pulmonary effort is normal.     Breath sounds: Normal breath sounds.  Abdominal:     Palpations: Abdomen is soft.     Tenderness: There is no abdominal tenderness.  Musculoskeletal:     Lumbar back: Normal. He exhibits normal range of motion, no tenderness, no bony tenderness, no spasm and normal pulse.     Right lower leg: No edema.     Left lower leg: No edema.  Skin:    General: Skin is warm and dry.  Neurological:     General: No focal deficit present.     Mental Status: He is alert and oriented to person, place, and time.     Sensory: No sensory deficit.     Motor: No weakness.     Coordination: Coordination normal.     Gait: Gait normal.     Deep Tendon Reflexes: Reflexes normal.  Psychiatric:        Mood and Affect: Mood normal.        Behavior: Behavior normal.      ASSESSMENT & PLAN: Jamie Foster was seen today for flank pain.  Diagnoses and all orders for this visit:  Low back pain without sciatica, unspecified back pain laterality, unspecified chronicity -     cyclobenzaprine (FLEXERIL) 10 MG tablet; Take 1 tablet (10 mg total) by mouth at bedtime. -      meloxicam (MOBIC) 15 MG tablet; Take 1 tablet (15 mg total) by mouth daily.  Essential hypertension -     lisinopril (PRINIVIL,ZESTRIL) 10 MG tablet; Take 1 tablet (10 mg total) by mouth daily.  Hyperlipidemia, unspecified hyperlipidemia type -     atorvastatin (LIPITOR) 40 MG tablet; Take 1 tablet (40 mg total) by mouth daily.    Patient Instructions       If you have lab work done today you will be contacted with your lab results within the next 2 weeks.  If you have not heard from us then please contact us. The fastest way to get your results is to register for My Chart.   IF you received an x-ray today, you will receive an invoice from [facility]. Please contact [facility] at [phone] with questions or concerns regarding your invoice.   IF you received labwork today, you will receive an invoice from [facility]. Please contact [facility] at [phone] with questions or concerns regarding your invoice.   Our billing staff will not be able to assist you with questions regarding bills from these companies.  You will be contacted with the lab results as soon as they are available. The fastest way to get your results is to activate your My Chart account. Instructions are located on the last page of this paperwork. If you have not heard from us regarding the results in 2 weeks, please contact this office.     Acute Back Pain, Adult Acute back pain is sudden and usually short-lived. It is often caused by an injury to the muscles and tissues in the back. The injury may result from:  A muscle or ligament getting overstretched or torn (strained). Ligaments are tissues that connect bones to each other. Lifting something improperly can cause a back strain.  Wear and tear (degeneration) of the spinal disks. Spinal disks are circular tissue that provides   cushioning between the bones of the spine (vertebrae).  Twisting motions, such as while playing sports or doing yard  work.  A hit to the back.  Arthritis. You may have a physical exam, lab tests, and imaging tests to find the cause of your pain. Acute back pain usually goes away with rest and home care. Follow these instructions at home: Managing pain, stiffness, and swelling  Take over-the-counter and prescription medicines only as told by your health care provider.  Your health care provider may recommend applying ice during the first 24-48 hours after your pain starts. To do this: ? Put ice in a plastic bag. ? Place a towel between your skin and the bag. ? Leave the ice on for 20 minutes, 2-3 times a day.  If directed, apply heat to the affected area as often as told by your health care provider. Use the heat source that your health care provider recommends, such as a moist heat pack or a heating pad. ? Place a towel between your skin and the heat source. ? Leave the heat on for 20-30 minutes. ? Remove the heat if your skin turns bright red. This is especially important if you are unable to feel pain, heat, or cold. You have a greater risk of getting burned. Activity   Do not stay in bed. Staying in bed for more than 1-2 days can delay your recovery.  Sit up and stand up straight. Avoid leaning forward when you sit, or hunching over when you stand. ? If you work at a desk, sit close to it so you do not need to lean over. Keep your chin tucked in. Keep your neck drawn back, and keep your elbows bent at a right angle. Your arms should look like the letter "L." ? Sit high and close to the steering wheel when you drive. Add lower back (lumbar) support to your car seat, if needed.  Take short walks on even surfaces as soon as you are able. Try to increase the length of time you walk each day.  Do not sit, drive, or stand in one place for more than 30 minutes at a time. Sitting or standing for long periods of time can put stress on your back.  Do not drive or use heavy machinery while taking  prescription pain medicine.  Use proper lifting techniques. When you bend and lift, use positions that put less stress on your back: ? Lopeno your knees. ? Keep the load close to your body. ? Avoid twisting.  Exercise regularly as told by your health care provider. Exercising helps your back heal faster and helps prevent back injuries by keeping muscles strong and flexible.  Work with a physical therapist to make a safe exercise program, as recommended by your health care provider. Do any exercises as told by your physical therapist. Lifestyle  Maintain a healthy weight. Extra weight puts stress on your back and makes it difficult to have good posture.  Avoid activities or situations that make you feel anxious or stressed. Stress and anxiety increase muscle tension and can make back pain worse. Learn ways to manage anxiety and stress, such as through exercise. General instructions  Sleep on a firm mattress in a comfortable position. Try lying on your side with your knees slightly bent. If you lie on your back, put a pillow under your knees.  Follow your treatment plan as told by your health care provider. This may include: ? Cognitive or behavioral therapy. ? Acupuncture  or massage therapy. ? Meditation or yoga. Contact a health care provider if:  You have pain that is not relieved with rest or medicine.  You have increasing pain going down into your legs or buttocks.  Your pain does not improve after 2 weeks.  You have pain at night.  You lose weight without trying.  You have a fever or chills. Get help right away if:  You develop new bowel or bladder control problems.  You have unusual weakness or numbness in your arms or legs.  You develop nausea or vomiting.  You develop abdominal pain.  You feel faint. Summary  Acute back pain is sudden and usually short-lived.  Use proper lifting techniques. When you bend and lift, use positions that put less stress on your  back.  Take over-the-counter and prescription medicines and apply heat or ice as directed by your health care provider. This information is not intended to replace advice given to you by your health care provider. Make sure you discuss any questions you have with your health care provider. Document Released: 11/10/2005 Document Revised: 06/17/2018 Document Reviewed: 06/24/2017 Elsevier Interactive Patient Education  2019 [employer] Inc[employer]      Miguel Sagardia, MD [facility] 

## 2019-01-21 NOTE — Telephone Encounter (Signed)
Spoke with the patient, [place] and is only avaliable certain days in town. I advised him that he was on as needed basis with Cardiology and he wanted to get fasting labs. I advised that his primary care where he lives could order these labs. He expressed understanding had no further questions.

## 2019-01-21 NOTE — Patient Instructions (Addendum)
   If you have lab work done today you will be contacted with your lab results within the next 2 weeks.  If you have not heard from us then please contact us. The fastest way to get your results is to register for My Chart.   IF you received an x-ray today, you will receive an invoice from [facility]. Please contact [facility] at [phone] with questions or concerns regarding your invoice.   IF you received labwork today, you will receive an invoice from [facility]. Please contact [facility] at [phone] with questions or concerns regarding your invoice.   Our billing staff will not be able to assist you with questions regarding bills from these companies.  You will be contacted with the lab results as soon as they are available. The fastest way to get your results is to activate your My Chart account. Instructions are located on the last page of this paperwork. If you have not heard from us regarding the results in 2 weeks, please contact this office.     Acute Back Pain, Adult Acute back pain is sudden and usually short-lived. It is often caused by an injury to the muscles and tissues in the back. The injury may result from:  A muscle or ligament getting overstretched or torn (strained). Ligaments are tissues that connect bones to each other. Lifting something improperly can cause a back strain.  Wear and tear (degeneration) of the spinal disks. Spinal disks are circular tissue that provides cushioning between the bones of the spine (vertebrae).  Twisting motions, such as while playing sports or doing yard work.  A hit to the back.  Arthritis. You may have a physical exam, lab tests, and imaging tests to find the cause of your pain. Acute back pain usually goes away with rest and home care. Follow these instructions at home: Managing pain, stiffness, and swelling  Take over-the-counter and prescription medicines only as told by your health care  provider.  Your health care provider may recommend applying ice during the first 24-48 hours after your pain starts. To do this: ? Put ice in a plastic bag. ? Place a towel between your skin and the bag. ? Leave the ice on for 20 minutes, 2-3 times a day.  If directed, apply heat to the affected area as often as told by your health care provider. Use the heat source that your health care provider recommends, such as a moist heat pack or a heating pad. ? Place a towel between your skin and the heat source. ? Leave the heat on for 20-30 minutes. ? Remove the heat if your skin turns bright red. This is especially important if you are unable to feel pain, heat, or cold. You have a greater risk of getting burned. Activity   Do not stay in bed. Staying in bed for more than 1-2 days can delay your recovery.  Sit up and stand up straight. Avoid leaning forward when you sit, or hunching over when you stand. ? If you work at a desk, sit close to it so you do not need to lean over. Keep your chin tucked in. Keep your neck drawn back, and keep your elbows bent at a right angle. Your arms should look like the letter "L." ? Sit high and close to the steering wheel when you drive. Add lower back (lumbar) support to your car seat, if needed.  Take short walks on even surfaces as soon as you are able. Try   to increase the length of time you walk each day.  Do not sit, drive, or stand in one place for more than 30 minutes at a time. Sitting or standing for long periods of time can put stress on your back.  Do not drive or use heavy machinery while taking prescription pain medicine.  Use proper lifting techniques. When you bend and lift, use positions that put less stress on your back: ? Bend your knees. ? Keep the load close to your body. ? Avoid twisting.  Exercise regularly as told by your health care provider. Exercising helps your back heal faster and helps prevent back injuries by keeping muscles  strong and flexible.  Work with a physical therapist to make a safe exercise program, as recommended by your health care provider. Do any exercises as told by your physical therapist. Lifestyle  Maintain a healthy weight. Extra weight puts stress on your back and makes it difficult to have good posture.  Avoid activities or situations that make you feel anxious or stressed. Stress and anxiety increase muscle tension and can make back pain worse. Learn ways to manage anxiety and stress, such as through exercise. General instructions  Sleep on a firm mattress in a comfortable position. Try lying on your side with your knees slightly bent. If you lie on your back, put a pillow under your knees.  Follow your treatment plan as told by your health care provider. This may include: ? Cognitive or behavioral therapy. ? Acupuncture or massage therapy. ? Meditation or yoga. Contact a health care provider if:  You have pain that is not relieved with rest or medicine.  You have increasing pain going down into your legs or buttocks.  Your pain does not improve after 2 weeks.  You have pain at night.  You lose weight without trying.  You have a fever or chills. Get help right away if:  You develop new bowel or bladder control problems.  You have unusual weakness or numbness in your arms or legs.  You develop nausea or vomiting.  You develop abdominal pain.  You feel faint. Summary  Acute back pain is sudden and usually short-lived.  Use proper lifting techniques. When you bend and lift, use positions that put less stress on your back.  Take over-the-counter and prescription medicines and apply heat or ice as directed by your health care provider. This information is not intended to replace advice given to you by your health care provider. Make sure you discuss any questions you have with your health care provider. Document Released: 11/10/2005 Document Revised: 06/17/2018 Document  Reviewed: 06/24/2017 Elsevier Interactive Patient Education  2019 [employer] Inc.  

## 2019-01-23 ENCOUNTER — Encounter: Payer: Self-pay | Admitting: Emergency Medicine

## 2019-01-23 DIAGNOSIS — M545 Low back pain, unspecified: Secondary | ICD-10-CM | POA: Insufficient documentation

## 2019-01-23 DIAGNOSIS — E785 Hyperlipidemia, unspecified: Secondary | ICD-10-CM | POA: Insufficient documentation

## 2019-02-15 LAB — HM DIABETES EYE EXAM

## 2019-02-18 MED FILL — MELOXICAM 15 MG TABLET: 15 | 30 days supply | Qty: 30 | Fill #1

## 2019-03-14 ENCOUNTER — Other Ambulatory Visit: Payer: Self-pay | Admitting: Emergency Medicine

## 2019-03-14 DIAGNOSIS — E11319 Type 2 diabetes mellitus with unspecified diabetic retinopathy without macular edema: Secondary | ICD-10-CM

## 2019-03-14 MED ORDER — FREESTYLE FREEDOM LITE W/DEVICE KIT: PACK | 0 refills | Status: DC

## 2019-04-11 ENCOUNTER — Other Ambulatory Visit: Payer: Self-pay

## 2019-04-11 ENCOUNTER — Telehealth: Payer: Self-pay

## 2019-04-11 ENCOUNTER — Ambulatory Visit (INDEPENDENT_AMBULATORY_CARE_PROVIDER_SITE_OTHER): Payer: 59 | Admitting: Family Medicine

## 2019-04-11 ENCOUNTER — Encounter: Payer: Self-pay | Admitting: Family Medicine

## 2019-04-11 VITALS — Ht 75.0 in | Wt 235.0 lb

## 2019-04-11 DIAGNOSIS — E1165 Type 2 diabetes mellitus with hyperglycemia: Secondary | ICD-10-CM

## 2019-04-11 DIAGNOSIS — E11319 Type 2 diabetes mellitus with unspecified diabetic retinopathy without macular edema: Secondary | ICD-10-CM

## 2019-04-11 DIAGNOSIS — IMO0002 Reserved for concepts with insufficient information to code with codable children: Secondary | ICD-10-CM

## 2019-04-11 MED ORDER — FREESTYLE LIBRE SENSOR SYSTEM MISC: 5 refills | Status: DC

## 2019-04-11 MED ORDER — DAPAGLIFLOZIN PROPANEDIOL 10 MG PO TABS: 10.0000 mg | ORAL_TABLET | Freq: Every day | ORAL | 2 refills | Status: DC

## 2019-04-11 MED FILL — FARXIGA 10 MG TABLET: 10 | 30 days supply | Qty: 30 | Fill #0

## 2019-04-11 MED FILL — FREESTYLE LIBRE 14 DAY READ: 1 days supply | Qty: 1 | Fill #0

## 2019-04-11 MED FILL — FREESTYLE LIBRE 14 DAY SENS: 28 days supply | Qty: 2 | Fill #0

## 2019-04-11 NOTE — Progress Notes (Addendum)
Chief Complaint  Patient presents with  . New Patient (Initial Visit)    discuss diabetes///mostly out of town working. hard to get appts.//refer endo.       New Patient Visit SUBJECTIVE: HPI: Jamie Foster is an 53 y.o.male who is being seen for establishing care.  The patient was previously seen at a different [place] office. Due to COVID-19 pandemic, we are interacting via web portal for an electronic face-to-face visit. I verified patient's ID using 2 identifiers. Patient agreed to proceed with visit via this method. Patient is at home, I am at office. Patient and I are present for visit.   Hx of DM. Last A1c in 12/2018 was 11.6. Reports taking Lantus depending on his nighttime reading. Take 15 u approx every other day. Metformin XR 1000 mg bid, glipizide 5 mg in AM, 10 mg in PM. Sugars in AM usually low 100's, low 100's in evening. Stopped taking Januvia as he did not feel it was helpful. Requesting Freestyle Libre for testing sugars. Also requesting to see endo.   No Known Allergies  Past Medical History:  Diagnosis Date  . Allergy   . Diabetes mellitus without complication ([place])   . H pylori ulcer   . Hyperlipidemia   . Hypertension   . Kidney stone   . Sleep apnea    Past Surgical History:  Procedure Laterality Date  . NASAL SINUS SURGERY     Family History  Problem Relation Age of Onset  . Hypertension Mother   . Prostate cancer Father   . Colon cancer Neg Hx   . Colon polyps Neg Hx    No Known Allergies  Current Outpatient Medications:  .  atorvastatin (LIPITOR) 40 MG tablet, Take 1 tablet (40 mg total) by mouth daily., Disp: 90 tablet, Rfl: 3 .  Blood Glucose Monitoring Suppl (FREESTYLE FREEDOM LITE) w/Device KIT, Freestyle lite kit/device with test strips and lancets DX: E11.1, Disp: 1 each, Rfl: 0 .  Blood Glucose Monitoring Suppl (ONE TOUCH ULTRA 2) w/Device KIT, Use to test blood sugar daily as instructed. ICD 10 E.11.8, Disp: 1 each, Rfl: 0 .  glipiZIDE  (GLUCOTROL) 5 MG tablet, Use 5 mg in am and 10 mg before dinner, Disp: 270 tablet, Rfl: 3 .  glucose blood (TRUETRACK TEST) test strip, Use 2x a day, Disp: 100 each, Rfl: 5 .  Insulin Glargine (LANTUS SOLOSTAR) 100 UNIT/ML Solostar Pen, Inject 15 Units into the skin daily., Disp: 10 pen, Rfl: 3 .  Insulin Pen Needle 32G X 4 MM MISC, Use 1x a day, Disp: 100 each, Rfl: 3 .  lisinopril (PRINIVIL,ZESTRIL) 10 MG tablet, Take 1 tablet (10 mg total) by mouth daily., Disp: 90 tablet, Rfl: 1 .  meloxicam (MOBIC) 15 MG tablet, Take 1 tablet (15 mg total) by mouth daily., Disp: 30 tablet, Rfl: 1 .  metFORMIN (GLUCOPHAGE-XR) 500 MG 24 hr tablet, Take 2 tablets (1,000 mg total) by mouth 2 (two) times daily., Disp: 360 tablet, Rfl: 3 .  ONETOUCH DELICA LANCETS 38U MISC, Use to test blood sugar 2 times daily as instructed., Disp: 100 each, Rfl: 11 .  polyethylene glycol (MIRALAX / GLYCOLAX) packet, Take 17 g by mouth daily., Disp: 14 each, Rfl: 0 .  [place] LANCETS MISC, Use 2x a day - Tru Track, Disp: 200 each, Rfl: 5 .  Continuous Blood Gluc Sensor ([place]) MISC, Use to check sugars. Replace after 14 days., Disp: 2 each, Rfl: 5 .  dapagliflozin propanediol (FARXIGA)  10 MG TABS tablet, Take 10 mg by mouth daily., Disp: 30 tablet, Rfl: 2  ROS Cardiovascular: Denies chest pain  Respiratory: Denies dyspnea   OBJECTIVE: No conversational dyspnea Age appropriate judgment and insight Nml affect and mood  ASSESSMENT/PLAN: Uncontrolled type 2 diabetes mellitus with retinopathy, without long-term current use of insulin ([place]) - Plan: Ambulatory referral to Endocrinology, dapagliflozin propanediol (FARXIGA) 10 MG TABS tablet, Continuous Blood Gluc Sensor ([place]) MISC  Needs to take Lantus nightly. Will decrease dose to 12 u to avoid hypoglycemia. Start SGLT-2 inhibitor. Requesting CGM, will try, but advised ins may not cover. Cont other meds for now. Cont monitoring  home sugars.  Patient should return 5 mo for CPE. The patient voiced understanding and agreement to the plan.   [place], DO 04/11/19  9:28 AM

## 2019-04-11 NOTE — Telephone Encounter (Signed)
Freestyle Reader: PA initiated via Covermymeds; KEY: [id number]. Awaiting determination.  Freestyle Sensors: PA initiated via Covermymeds; KEY: [id number]. Awaiting determination.

## 2019-04-12 NOTE — Telephone Encounter (Signed)
PA approved for device and sensors.

## 2019-05-20 ENCOUNTER — Encounter: Payer: 59 | Admitting: Family Medicine

## 2019-05-26 MED FILL — LANTUS SOLOSTAR 100 UNITS/M: 100 | 80 days supply | Qty: 12 | Fill #1

## 2019-05-26 MED FILL — glipiZIDE 5 MG TABS: 5 | 90 days supply | Qty: 270 | Fill #1

## 2019-05-26 MED FILL — LISINOPRIL 10 MG TABLET: 10 | 90 days supply | Qty: 90 | Fill #1

## 2019-05-26 MED FILL — metFORMIN HCL ER 500 MG TB2: 500 | 90 days supply | Qty: 360 | Fill #1

## 2019-05-26 MED FILL — ATORVASTATIN 40 MG TABLET: 40 | 90 days supply | Qty: 90 | Fill #1

## 2019-05-26 MED FILL — ULTICARE PEN NDL 4MM 32G: 32G X 4 MM | 90 days supply | Qty: 100 | Fill #1

## 2019-05-26 MED FILL — FREESTYLE LIBRE 14 DAY SENS: 28 days supply | Qty: 2 | Fill #1

## 2019-05-26 MED FILL — FARXIGA 10 MG TABLET: 10 | 30 days supply | Qty: 30 | Fill #1

## 2019-06-03 ENCOUNTER — Telehealth: Payer: Self-pay

## 2019-06-03 DIAGNOSIS — E11319 Type 2 diabetes mellitus with unspecified diabetic retinopathy without macular edema: Secondary | ICD-10-CM

## 2019-06-03 DIAGNOSIS — IMO0002 Reserved for concepts with insufficient information to code with codable children: Secondary | ICD-10-CM

## 2019-06-03 NOTE — Telephone Encounter (Signed)
Copied from [place] [phone]. Topic: Referral - Request for Referral >> Jun 03, 2019 12:55 PM Selinda Flavin B, NT wrote: Has patient seen PCP for this complaint? Yes.   *If NO, is insurance requiring patient see PCP for this issue before PCP can refer them? Referral for which specialty: Endocrinology Preferred provider/office: Dr Posey Pronto or anyone in that office Reason for referral: States that he would like a new endocrinologist and needs the referral.

## 2019-06-03 NOTE — Telephone Encounter (Signed)
OK, dx diabetes. Ty.

## 2019-06-06 NOTE — Addendum Note (Signed)
Addended by: Sharon Seller B on: 06/06/2019 07:48 AM   Modules accepted: Orders

## 2019-06-06 NOTE — Telephone Encounter (Signed)
done

## 2019-08-15 DIAGNOSIS — M25522 Pain in left elbow: Secondary | ICD-10-CM | POA: Diagnosis not present

## 2019-08-15 DIAGNOSIS — M5412 Radiculopathy, cervical region: Secondary | ICD-10-CM | POA: Diagnosis not present

## 2019-08-15 DIAGNOSIS — M5030 Other cervical disc degeneration, unspecified cervical region: Secondary | ICD-10-CM | POA: Diagnosis not present

## 2019-08-15 DIAGNOSIS — M7712 Lateral epicondylitis, left elbow: Secondary | ICD-10-CM | POA: Diagnosis not present

## 2019-08-15 DIAGNOSIS — M5382 Other specified dorsopathies, cervical region: Secondary | ICD-10-CM | POA: Diagnosis not present

## 2019-08-15 DIAGNOSIS — M24822 Other specific joint derangements of left elbow, not elsewhere classified: Secondary | ICD-10-CM | POA: Diagnosis not present

## 2019-08-26 DIAGNOSIS — M4723 Other spondylosis with radiculopathy, cervicothoracic region: Secondary | ICD-10-CM | POA: Diagnosis not present

## 2019-08-26 DIAGNOSIS — M4722 Other spondylosis with radiculopathy, cervical region: Secondary | ICD-10-CM | POA: Diagnosis not present

## 2019-08-26 DIAGNOSIS — M4802 Spinal stenosis, cervical region: Secondary | ICD-10-CM | POA: Diagnosis not present

## 2019-08-26 DIAGNOSIS — M4803 Spinal stenosis, cervicothoracic region: Secondary | ICD-10-CM | POA: Diagnosis not present

## 2019-08-29 DIAGNOSIS — M5412 Radiculopathy, cervical region: Secondary | ICD-10-CM | POA: Diagnosis not present

## 2019-08-29 DIAGNOSIS — M7712 Lateral epicondylitis, left elbow: Secondary | ICD-10-CM | POA: Diagnosis not present

## 2019-08-29 DIAGNOSIS — M5030 Other cervical disc degeneration, unspecified cervical region: Secondary | ICD-10-CM | POA: Diagnosis not present

## 2019-09-21 IMAGING — CT CT CORONARY FRACTIONAL FLOW RESERVE
4 of 7 series · 8 of 20 positions shown, 9 images · IV contrast (APPLIED)
Comparison: None.

CLINICAL DATA: 51-year-old male with h/o diabetes, hypertension and
atypical chest pain.

EXAM:
Cardiac/Coronary  CT
TECHNIQUE: The patient was scanned on a Phillips Force scanner.

[Series 6: best diast 72 % · axial · 0.39mm/px · z∈[+1116,+1164]mm · 2 of 361 slices shown, 3 images]
[im 121/361  vessel]
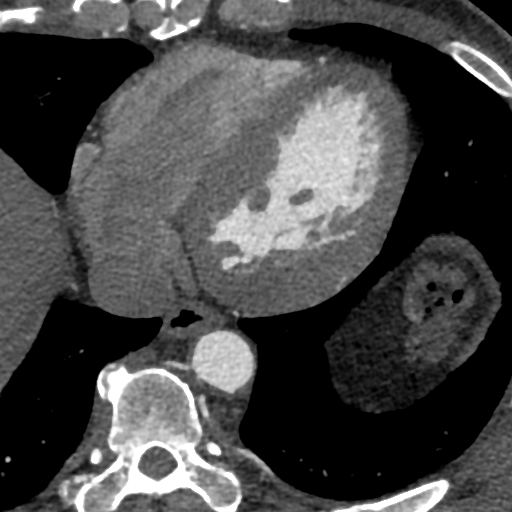
[im 121/361  lung]
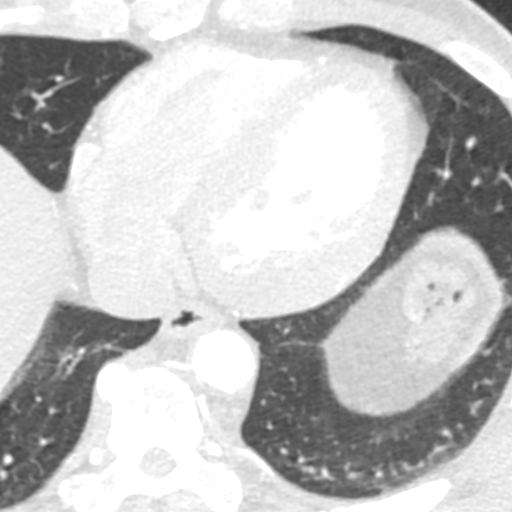
[im 241/361  vessel]
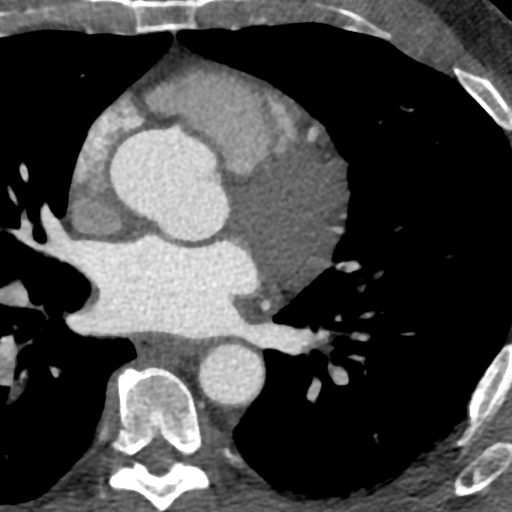

[Series 7: best syst 39 % · axial · 0.39mm/px · z∈[+1116,+1164]mm · 2 of 361 slices shown]
[im 121/361  vessel]
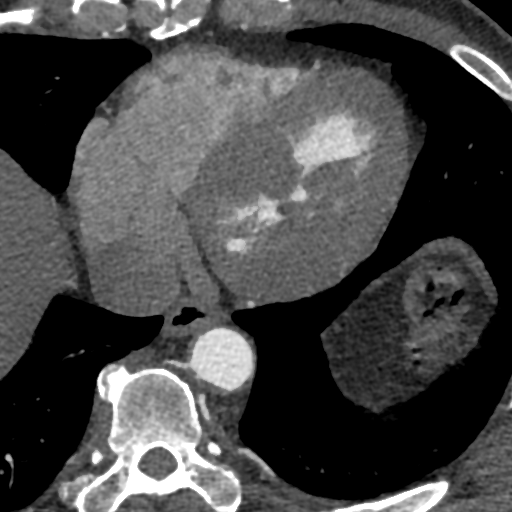
[im 241/361  vessel]
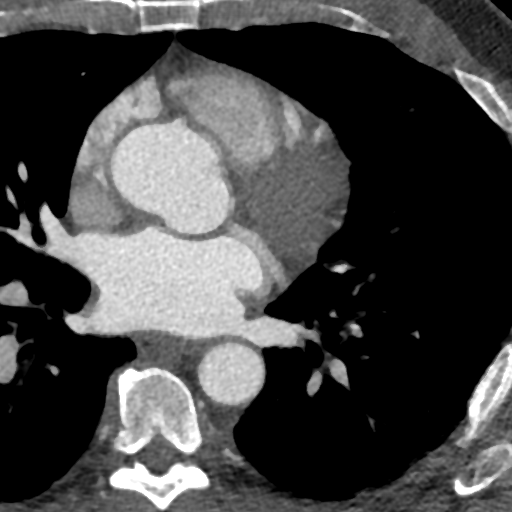

[Series 8: ts diast sharp 72 % · axial · 0.39mm/px · z∈[+1116,+1164]mm · 2 of 361 slices shown]
[im 121/361  lung]
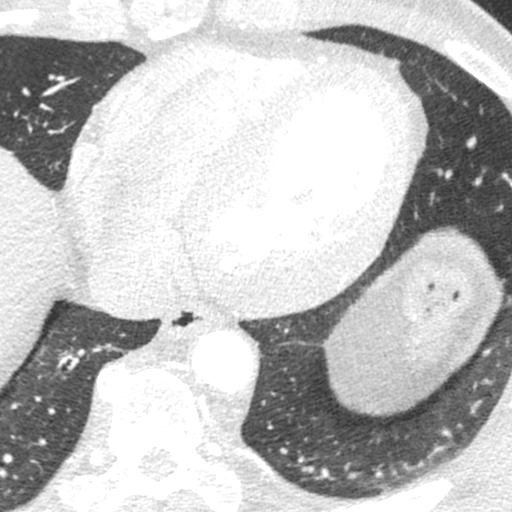
[im 241/361  lung]
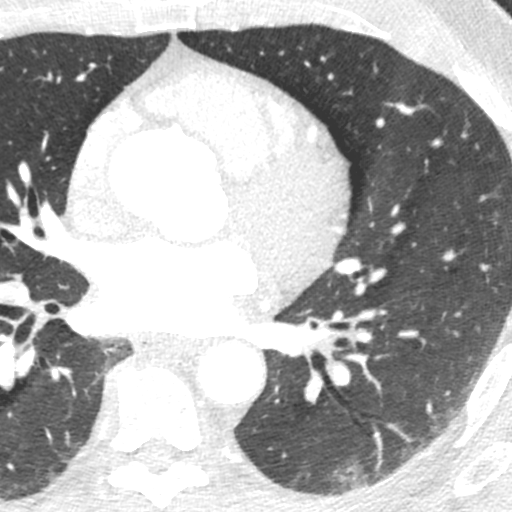

[Series 9: ts syst sharp 39 % · axial · 0.39mm/px · z∈[+1116,+1164]mm · 2 of 361 slices shown]
[im 121/361  lung]
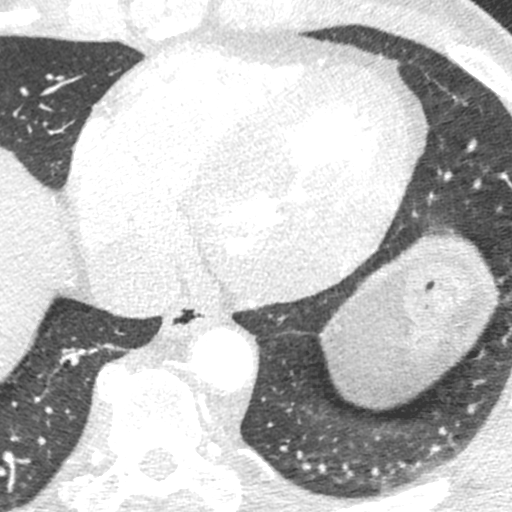
[im 241/361  lung]
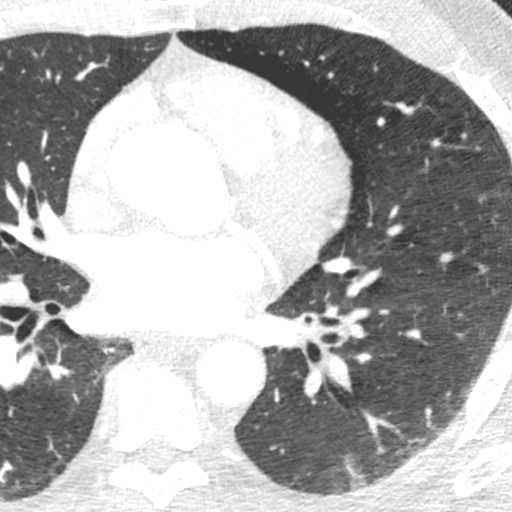

[8 of 20 positions shown; findings below may reference images not displayed]



Aorta:  Normal size.  No calcifications.  No dissection.

Aortic Valve:  Trileaflet.  No calcifications.

Coronary Arteries:  Normal coronary origin.  Left dominance.

Left main is a large artery that gives rise to LAD and LCX arteries.
Left main has no plaque.

LAD is a large vessel that gives rise to one large diagonal artery
and wraps around the apex where it supplies the entire PDA
territory. There is minimal plaque.

LCX is a dominant artery that gives rise to two large OM branches
and a small PLA. There is mild calcified plaque in the mid LCX
artery with associated stenosis 0-25%.

RCA is a small non-dominant artery with no obvious plaque.

Other findings:

Normal pulmonary vein drainage into the left atrium.

Normal let atrial appendage without a thrombus.

Normal size of the pulmonary artery.
IMPRESSION: 1. Coronary calcium score of 3. This was 70 percentile for age and
sex matched control.

2. Normal coronary origin with left dominance.

3.  Mild non-obstructive CAD.

EXAM:
OVER-READ INTERPRETATION  CT CHEST

The following report is an over-read performed by radiologist Dr.
Steffen Posada [REDACTED] on 04/22/2018. This
over-read does not include interpretation of cardiac or coronary
anatomy or pathology. The coronary calcium score/coronary CTA
interpretation by the cardiologist is attached.
FINDINGS: Aortic atherosclerosis. Within the visualized portions of the thorax
there are no suspicious appearing pulmonary nodules or masses, there
is no acute consolidative airspace disease, no pleural effusions, no
pneumothorax and no lymphadenopathy. Visualized portions of the
upper abdomen are unremarkable. There are no aggressive appearing
lytic or blastic lesions noted in the visualized portions of the
skeleton.
IMPRESSION: 1.  Aortic Atherosclerosis ([id number]).

## 2019-09-23 ENCOUNTER — Encounter: Payer: 59 | Admitting: Family Medicine

## 2019-10-18 ENCOUNTER — Encounter: Payer: 59 | Admitting: Family Medicine

## 2019-11-22 ENCOUNTER — Other Ambulatory Visit: Payer: Self-pay | Admitting: Emergency Medicine

## 2019-11-22 DIAGNOSIS — I10 Essential (primary) hypertension: Secondary | ICD-10-CM

## 2019-11-22 NOTE — Telephone Encounter (Signed)
Requested medication (s) are due for refill today: yes  Requested medication (s) are on the active medication list: yes  Last refill:  01/21/2019 by Mitchel Honour  Future visit scheduled:no  Notes to clinic: labs are overdue    Requested Prescriptions  Pending Prescriptions Disp Refills   lisinopril (ZESTRIL) 10 MG tablet [Pharmacy Med Name: LISINOPRIL 10 MG TABLET 10 Tablet] 90 tablet 1    Sig: Take 1 tablet (10 mg total) by mouth daily.      Cardiovascular:  ACE Inhibitors Failed - 11/22/2019  3:34 PM      Failed - Cr in normal range and within 180 days    Creat  Date Value Ref Range Status  12/21/2015 1.16 0.70 - 1.33 mg/dL Final   Creatinine, Ser  Date Value Ref Range Status  05/03/2018 1.10 0.76 - 1.27 mg/dL Final          Failed - K in normal range and within 180 days    Potassium  Date Value Ref Range Status  05/03/2018 4.1 3.5 - 5.2 mmol/L Final          Failed - Valid encounter within last 6 months    Recent Outpatient Visits           7 months ago Uncontrolled type 2 diabetes mellitus with retinopathy, without long-term current use of insulin ([place])   [facility] [place], DO   10 months ago Low back pain without sciatica, unspecified back pain laterality, unspecified chronicity   [facility], Ines Bloomer, MD   1 year ago Annual physical exam   [facility], Ranell Patrick, MD   2 years ago Acute maxillary sinusitis, recurrence not specified   [facility], Ranell Patrick, MD   2 years ago Annual physical exam   [facility] at [facility], Ranell Patrick, MD              Passed - Patient is not pregnant      Passed - Last BP in normal range    BP Readings from Last 1 Encounters:  01/21/19 126/78

## 2019-11-24 ENCOUNTER — Other Ambulatory Visit: Payer: Self-pay | Admitting: Emergency Medicine

## 2019-11-24 ENCOUNTER — Other Ambulatory Visit: Payer: Self-pay | Admitting: Family Medicine

## 2019-11-24 DIAGNOSIS — I10 Essential (primary) hypertension: Secondary | ICD-10-CM

## 2019-11-24 MED FILL — LISINOPRIL 10 MG TABS: 10 | 90 days supply | Qty: 90 | Fill #0

## 2019-11-24 NOTE — Telephone Encounter (Signed)
Requested medication (s) are due for refill today: yes  Requested medication (s) are on the active medication list: yes  Last refill:  05/26/2019  Future visit scheduled: no  Notes to clinic: no valid encounter within last 6 months Review for refill   Requested Prescriptions  Pending Prescriptions Disp Refills   lisinopril (ZESTRIL) 10 MG tablet [Pharmacy Med Name: LISINOPRIL 10 MG TABLET 10 Tablet] 90 tablet 1    Sig: TAKE 1 TABLET (10 MG TOTAL) BY MOUTH DAILY.      Cardiovascular:  ACE Inhibitors Failed - 11/24/2019 12:49 PM      Failed - Cr in normal range and within 180 days    Creat  Date Value Ref Range Status  12/21/2015 1.16 0.70 - 1.33 mg/dL Final   Creatinine, Ser  Date Value Ref Range Status  05/03/2018 1.10 0.76 - 1.27 mg/dL Final          Failed - K in normal range and within 180 days    Potassium  Date Value Ref Range Status  05/03/2018 4.1 3.5 - 5.2 mmol/L Final          Failed - Valid encounter within last 6 months    Recent Outpatient Visits           7 months ago Uncontrolled type 2 diabetes mellitus with retinopathy, without long-term current use of insulin ([place])   [facility], DO   10 months ago Low back pain without sciatica, unspecified back pain laterality, unspecified chronicity   [facility] at [facility], Ines Bloomer, MD   1 year ago Annual physical exam   [facility], Ranell Patrick, MD   2 years ago Acute maxillary sinusitis, recurrence not specified   [facility] at [facility], Ranell Patrick, MD   2 years ago Annual physical exam   [facility] at [facility], Ranell Patrick, MD              Passed - Patient is not pregnant      Passed - Last BP in normal range    BP Readings from Last 1 Encounters:  01/21/19 126/78

## 2019-11-26 MED FILL — FARXIGA 10 MG TABLET: 10 | 30 days supply | Qty: 30 | Fill #0

## 2019-11-26 MED FILL — glipiZIDE 5 MG TABS: 5 | 90 days supply | Qty: 270 | Fill #0

## 2019-11-26 MED FILL — FREESTYLE LIBRE 14 DAY SENS: 28 days supply | Qty: 2 | Fill #0

## 2019-11-26 MED FILL — metFORMIN HCL ER 500 MG TB2: 500 | 90 days supply | Qty: 360 | Fill #0

## 2020-02-03 ENCOUNTER — Other Ambulatory Visit: Payer: Self-pay | Admitting: Family Medicine

## 2020-02-03 ENCOUNTER — Telehealth: Payer: Self-pay | Admitting: Family Medicine

## 2020-02-03 DIAGNOSIS — IMO0002 Reserved for concepts with insufficient information to code with codable children: Secondary | ICD-10-CM

## 2020-02-03 DIAGNOSIS — E11319 Type 2 diabetes mellitus with unspecified diabetic retinopathy without macular edema: Secondary | ICD-10-CM

## 2020-02-03 MED ORDER — GLIPIZIDE 5 MG PO TABS: ORAL_TABLET | ORAL | 0 refills | Status: DC

## 2020-02-03 MED ORDER — METFORMIN HCL ER 500 MG PO TB24: 1000.0000 mg | ORAL_TABLET | Freq: Two times a day (BID) | ORAL | 0 refills | Status: DC

## 2020-02-03 MED FILL — metFORMIN HCL ER 500 MG TB2: 500 | 30 days supply | Qty: 120 | Fill #0

## 2020-02-03 MED FILL — glipiZIDE 5 MG TABS: 5 | 30 days supply | Qty: 90 | Fill #0

## 2020-02-03 MED FILL — FARXIGA 10 MG TABLET: 10 | 90 days supply | Qty: 90 | Fill #0

## 2020-02-03 MED FILL — FREESTYLE LIBRE 14 DAY SENS: 84 days supply | Qty: 6 | Fill #0

## 2020-02-03 NOTE — Telephone Encounter (Signed)
Patient wants Dr.Wendling to put in a referall for an endocrinologist. He said he put in for one , back in December he didn't want to go to that one .  Please advise patient [phone] via phone call

## 2020-02-03 NOTE — Telephone Encounter (Signed)
OK, find out who he wants to see and refer him there. Ty.

## 2020-02-03 NOTE — Telephone Encounter (Signed)
Referral done to [facility] as patient requested

## 2020-02-20 ENCOUNTER — Telehealth: Payer: Self-pay | Admitting: Family Medicine

## 2020-02-20 ENCOUNTER — Other Ambulatory Visit: Payer: Self-pay

## 2020-02-20 ENCOUNTER — Ambulatory Visit: Payer: 59 | Attending: Internal Medicine

## 2020-02-20 DIAGNOSIS — Z23 Encounter for immunization: Secondary | ICD-10-CM

## 2020-02-20 NOTE — Progress Notes (Signed)
° °  Covid-19 Vaccination Clinic  Name:  Jamie Foster    MRN: [id number] DOB: [birth year]  02/20/2020  Mr. Jamie Foster was observed post Covid-19 immunization for 15 minutes without incident. He was provided with Vaccine Information Sheet and instruction to access the V-Safe system.   Mr. Jamie Foster was instructed to call 911 with any severe reactions post vaccine:  Difficulty breathing   Swelling of face and throat   A fast heartbeat   A bad rash all over body   Dizziness and weakness   Immunizations Administered    Name Date Dose VIS Date Route   Pfizer COVID-19 Vaccine 02/20/2020 10:26 AM 0.3 mL 11/04/2019 Intramuscular   Manufacturer: [employer], [employer]   Lot: [id number]   NDC: [address]-0

## 2020-02-20 NOTE — Telephone Encounter (Signed)
Called left message to call back 

## 2020-02-20 NOTE — Telephone Encounter (Signed)
Called to give the patient [employer] number [phone]

## 2020-02-20 NOTE — Telephone Encounter (Signed)
Caller Jamie Foster  Call Back # [phone]  Patient states that he would like to schedule an appointment with endocrinology. However they haven't received his file.    Please advise

## 2020-02-21 ENCOUNTER — Encounter: Payer: Self-pay | Admitting: Family Medicine

## 2020-02-21 ENCOUNTER — Other Ambulatory Visit: Payer: Self-pay

## 2020-02-21 ENCOUNTER — Ambulatory Visit (INDEPENDENT_AMBULATORY_CARE_PROVIDER_SITE_OTHER): Payer: 59 | Admitting: Family Medicine

## 2020-02-21 ENCOUNTER — Other Ambulatory Visit: Payer: Self-pay | Admitting: Family Medicine

## 2020-02-21 VITALS — BP 118/78 | HR 90 | Temp 95.0°F | Ht 75.0 in | Wt 212.2 lb

## 2020-02-21 DIAGNOSIS — B353 Tinea pedis: Secondary | ICD-10-CM

## 2020-02-21 DIAGNOSIS — Z125 Encounter for screening for malignant neoplasm of prostate: Secondary | ICD-10-CM | POA: Diagnosis not present

## 2020-02-21 DIAGNOSIS — E1165 Type 2 diabetes mellitus with hyperglycemia: Secondary | ICD-10-CM

## 2020-02-21 DIAGNOSIS — E11319 Type 2 diabetes mellitus with unspecified diabetic retinopathy without macular edema: Secondary | ICD-10-CM | POA: Diagnosis not present

## 2020-02-21 DIAGNOSIS — Z0000 Encounter for general adult medical examination without abnormal findings: Secondary | ICD-10-CM

## 2020-02-21 DIAGNOSIS — IMO0002 Reserved for concepts with insufficient information to code with codable children: Secondary | ICD-10-CM

## 2020-02-21 LAB — COMPREHENSIVE METABOLIC PANEL
ALT: 27 U/L (ref 0–53)
AST: 18 U/L (ref 0–37)
Albumin: 4.5 g/dL (ref 3.5–5.2)
Alkaline Phosphatase: 50 U/L (ref 39–117)
BUN: 25 mg/dL — ABNORMAL HIGH (ref 6–23)
CO2: 32 mEq/L (ref 19–32)
Calcium: 9.8 mg/dL (ref 8.4–10.5)
Chloride: 98 mEq/L (ref 96–112)
Creatinine, Ser: 1.22 mg/dL (ref 0.40–1.50)
GFR: 74.5 mL/min (ref >60.00)
Glucose, Bld: 183 mg/dL — ABNORMAL HIGH (ref 70–99)
Potassium: 4.2 mEq/L (ref 3.5–5.1)
Sodium: 135 mEq/L (ref 135–145)
Total Bilirubin: 0.7 mg/dL (ref 0.2–1.2)
Total Protein: 6.8 g/dL (ref 6.0–8.3)

## 2020-02-21 LAB — CBC
HCT: 42.5 % (ref 39.0–52.0)
Hemoglobin: 14.2 g/dL (ref 13.0–17.0)
MCHC: 33.5 g/dL (ref 30.0–36.0)
MCV: 91.7 fl (ref 78.0–100.0)
Platelets: 239 10*3/uL (ref 150.0–400.0)
RBC: 4.64 Mil/uL (ref 4.22–5.81)
RDW: 13.4 % (ref 11.5–15.5)
WBC: 6.6 10*3/uL (ref 4.0–10.5)

## 2020-02-21 LAB — LIPID PANEL
Cholesterol: 115 mg/dL (ref 0–200)
HDL: 47.1 mg/dL (ref >39.00)
LDL Cholesterol: 57 mg/dL (ref 0–99)
NonHDL: 68.19
Total CHOL/HDL Ratio: 2
Triglycerides: 55 mg/dL (ref 0.0–149.0)
VLDL: 11 mg/dL (ref 0.0–40.0)

## 2020-02-21 LAB — PSA: PSA: 1.1 ng/mL (ref 0.10–4.00)

## 2020-02-21 LAB — MICROALBUMIN / CREATININE URINE RATIO
Creatinine,U: 139 mg/dL
Microalb Creat Ratio: 3.6 mg/g (ref 0.0–30.0)
Microalb, Ur: 5 mg/dL — ABNORMAL HIGH (ref 0.0–1.9)

## 2020-02-21 LAB — HEMOGLOBIN A1C: Hgb A1c MFr Bld: 11.2 % — ABNORMAL HIGH (ref 4.6–6.5)

## 2020-02-21 MED ORDER — KETOCONAZOLE 2 % EX CREA: 1.0000 "application " | TOPICAL_CREAM | Freq: Every day | CUTANEOUS | 0 refills | Status: DC

## 2020-02-21 MED FILL — LANTUS SOLOSTAR 100 UNITS/M: 100 | 80 days supply | Qty: 12 | Fill #0

## 2020-02-21 MED FILL — KETOCONAZOLE 2% CREAM: 2 | 30 days supply | Qty: 60 | Fill #0

## 2020-02-21 NOTE — Patient Instructions (Addendum)
Give [place] 2-3 business days to get the results of your labs back.   The new Shingrix vaccine (for shingles) is a 2 shot series. It can make people feel low energy, achy and almost like they have the flu for 48 hours after injection. Please plan accordingly when deciding on when to get this shot. Call our office for a nurse visit appointment to get this. The second shot of the series is less severe regarding the side effects, but it still lasts 48 hours. Wait at least 2 weeks after your second covid vaccine.  Keep the diet clean and stay active.  Let us know if you need anything.

## 2020-02-21 NOTE — Telephone Encounter (Signed)
Patient was seen in the office today 02/21/20

## 2020-02-21 NOTE — Progress Notes (Signed)
Chief Complaint  Patient presents with  . Annual Exam    wants all labs    Well Male Jamie Foster is here for a complete physical.   His last physical was >1 year ago.  Current diet: in general, diet could be better. Current exercise: walking Weight trend: Decreased Daytime fatigue? No. Seat belt? Yes.    Health maintenance Shingrix- No Colonoscopy- Yes Tetanus- Yes HIV- Yes Hep C- Yes   Past Medical History:  Diagnosis Date  . Allergy   . Diabetes mellitus without complication ([place])   . H pylori ulcer   . Hyperlipidemia   . Hypertension   . Kidney stone   . Sleep apnea       Past Surgical History:  Procedure Laterality Date  . NASAL SINUS SURGERY      Medications  Current Outpatient Medications on File Prior to Visit  Medication Sig Dispense Refill  . Blood Glucose Monitoring Suppl (FREESTYLE FREEDOM LITE) w/Device KIT Freestyle lite kit/device with test strips and lancets DX: E11.1 1 each 0  . Blood Glucose Monitoring Suppl (ONE TOUCH ULTRA 2) w/Device KIT Use to test blood sugar daily as instructed. ICD 10 E.11.8 1 each 0  . Continuous Blood Gluc Receiver (FREESTYLE LIBRE 14 DAY READER) DEVI USE TO CHECK SUGARS. REPLACE AFTER 14 DAYS. 1 each 0  . Continuous Blood Gluc Sensor (FREESTYLE LIBRE SENSOR SYSTEM) MISC Use to check sugars. Replace after 14 days. 2 each 5  . FARXIGA 10 MG TABS tablet TAKE 1 TABLET BY MOUTH ONCE DAILY 30 tablet 0  . glipiZIDE (GLUCOTROL) 5 MG tablet Use 5 mg in am and 10 mg before dinner 90 tablet 0  . glucose blood (TRUETRACK TEST) test strip Use 2x a day 100 each 5  . Insulin Glargine (LANTUS SOLOSTAR) 100 UNIT/ML Solostar Pen Inject 12 Units into the skin daily. 10 pen 3  . Insulin Pen Needle 32G X 4 MM MISC Use 1x a day 100 each 3  . lisinopril (ZESTRIL) 10 MG tablet TAKE 1 TABLET (10 MG TOTAL) BY MOUTH DAILY. 90 tablet 1  . meloxicam (MOBIC) 15 MG tablet Take 1 tablet (15 mg total) by mouth daily. 30 tablet 1  . metFORMIN  (GLUCOPHAGE-XR) 500 MG 24 hr tablet Take 2 tablets (1,000 mg total) by mouth 2 (two) times daily. 120 tablet 0  . ONETOUCH DELICA LANCETS 69G MISC Use to test blood sugar 2 times daily as instructed. 100 each 11  . polyethylene glycol (MIRALAX / GLYCOLAX) packet Take 17 g by mouth daily. 14 each 0  . [employer] LANCETS MISC Use 2x a day - Tru Track 200 each 5  . atorvastatin (LIPITOR) 40 MG tablet Take 1 tablet (40 mg total) by mouth daily. (Patient not taking: Reported on 02/21/2020) 90 tablet 3   No current facility-administered medications on file prior to visit.     Allergies No Known Allergies  Family History Family History  Problem Relation Age of Onset  . Hypertension Mother   . Prostate cancer Father   . Colon cancer Neg Hx   . Colon polyps Neg Hx     Review of Systems: Constitutional:  no fevers Eye:  no recent significant change in vision Ear/Nose/Mouth/Throat:  Ears:  no hearing loss Nose/Mouth/Throat:  no complaints of nasal congestion, no sore throat Cardiovascular:  no chest pain, no palpitations Respiratory:  no cough and no shortness of breath Gastrointestinal:  no abdominal pain, no change in bowel habits GU:  Male:  negative for dysuria, frequency, and incontinence and negative for prostate symptoms Musculoskeletal/Extremities:  no pain, redness, or swelling of the joints Integumentary (Skin/Breast):  no abnormal skin lesions reported Neurologic:  no headaches Endocrine: No unexpected weight changes Hematologic/Lymphatic:  no abnormal bleeding  Exam BP 118/78 (BP Location: Left Arm, Patient Position: Sitting, Cuff Size: Large)   Pulse 90   Temp (!) 95 F (35 C) (Temporal)   Ht _0  (1.905 m)   Wt 212 lb 4 oz (96.3 kg)   SpO2 97%   BMI 26.53 kg/m  General:  well developed, well nourished, in no apparent distress Skin: macerated skin between 4th and 5th digits b/l; otherwise no significant moles, warts, or growths Head:  no masses, lesions, or  tenderness Eyes:  pupils equal and round, sclera anicteric without injection Ears:  canals without lesions, TMs shiny without retraction, no obvious effusion, no erythema Nose:  nares patent, septum midline, mucosa normal Throat/Pharynx:  lips and gingiva without lesion; tongue and uvula midline; non-inflamed pharynx; no exudates or postnasal drainage Neck: neck supple without adenopathy, thyromegaly, or masses Cardiac: RRR, no bruits, no LE edema Lungs:  clear to auscultation, breath sounds equal bilaterally, no respiratory distress Rectal: Deferred Musculoskeletal:  symmetrical muscle groups noted without atrophy or deformity Neuro: sensation intact to pinprick b/l; gait normal; deep tendon reflexes normal and symmetric Psych: well oriented with normal range of affect and appropriate judgment/insight  Assessment and Plan  Well adult exam - Plan: CBC, Comprehensive metabolic panel, Lipid panel  Screening for malignant neoplasm of prostate - Plan: PSA  Uncontrolled type 2 diabetes mellitus with retinopathy, without long-term current use of insulin ([facility]) - Plan: Hemoglobin A1c, Microalbumin / creatinine urine ratio, Ambulatory referral to Ophthalmology  Tinea pedis of both feet - Plan: ketoconazole (NIZORAL) 2 % cream   Well 54 y.o. male. Counseled on diet and exercise. Counseled on risks and benefits of prostate cancer screening with PSA. The patient agrees to undergo testing. Immunizations, labs, and further orders as above. Follow up in 3-6 mo pending the above. The patient voiced understanding and agreement to the plan.  [place], DO 02/21/20 10:10 AM

## 2020-02-22 ENCOUNTER — Other Ambulatory Visit: Payer: Self-pay | Admitting: Family Medicine

## 2020-02-22 MED ORDER — PIOGLITAZONE HCL 30 MG PO TABS: 30.0000 mg | ORAL_TABLET | Freq: Every day | ORAL | 2 refills | Status: DC

## 2020-02-22 MED FILL — PIOGLITAZONE HCL 30 MG TAB: 30 | 90 days supply | Qty: 90 | Fill #0

## 2020-02-23 ENCOUNTER — Telehealth: Payer: Self-pay | Admitting: Family Medicine

## 2020-02-23 NOTE — Telephone Encounter (Signed)
Called left message to call back 

## 2020-02-23 NOTE — Telephone Encounter (Signed)
He took the new medication last night and woke this am was nauseated and felt very fatigued. Should he continue or should he stop. He stated once he ate he has felt fine. Sugar this am was 182, then  Later on in the morning 215 Once he had eaten he felt much better.

## 2020-02-23 NOTE — Telephone Encounter (Signed)
Called informed of PCP instructions. 

## 2020-02-23 NOTE — Telephone Encounter (Signed)
PT states he wants a call about his lab results. He stated he got a message from Zella Ball but would like Wendling to call him back  [phone]. Pt states this is very urgent and he feels something is not right with his body. He is requesting a call back today. Thanks

## 2020-02-23 NOTE — Telephone Encounter (Signed)
Try to take and if it happens again, stop and let us know. Ty.

## 2020-03-12 ENCOUNTER — Ambulatory Visit: Payer: 59 | Attending: Internal Medicine

## 2020-03-12 DIAGNOSIS — Z23 Encounter for immunization: Secondary | ICD-10-CM

## 2020-03-12 NOTE — Progress Notes (Signed)
   Covid-19 Vaccination Clinic  Name:  ZYIRE EIDSON    MRN: [id number] DOB: 09-Jul-1966  03/12/2020  Mr. Cutrone was observed post Covid-19 immunization for 15 minutes without incident. He was provided with Vaccine Information Sheet and instruction to access the V-Safe system.   Mr. Froelich was instructed to call 911 with any severe reactions post vaccine: Marland Kitchen Difficulty breathing  . Swelling of face and throat  . A fast heartbeat  . A bad rash all over body  . Dizziness and weakness   Immunizations Administered    Name Date Dose VIS Date Route   Pfizer COVID-19 Vaccine 03/12/2020 10:11 AM 0.3 mL 01/18/2019 Intramuscular   Manufacturer: [employer], [employer]   Lot: [id number]   NDC: [address]-0

## 2020-03-13 ENCOUNTER — Other Ambulatory Visit: Payer: Self-pay | Admitting: Family Medicine

## 2020-03-13 MED FILL — metFORMIN HCL ER 500 MG TB2: 500 | 30 days supply | Qty: 120 | Fill #0

## 2020-03-13 MED FILL — glipiZIDE 5 MG TABS: 5 | 30 days supply | Qty: 90 | Fill #0

## 2020-03-13 MED FILL — LISINOPRIL 10 MG TABS: 10 | 90 days supply | Qty: 90 | Fill #0

## 2020-03-30 IMAGING — DX DG ABDOMEN 1V
2 series · 2 of 2 positions shown · non-contrast
Comparison: CT scan 05/05/2008

CLINICAL DATA: Bilateral flank pain.

EXAM:
ABDOMEN - 1 VIEW

[abdomen kub (1 of 2)]
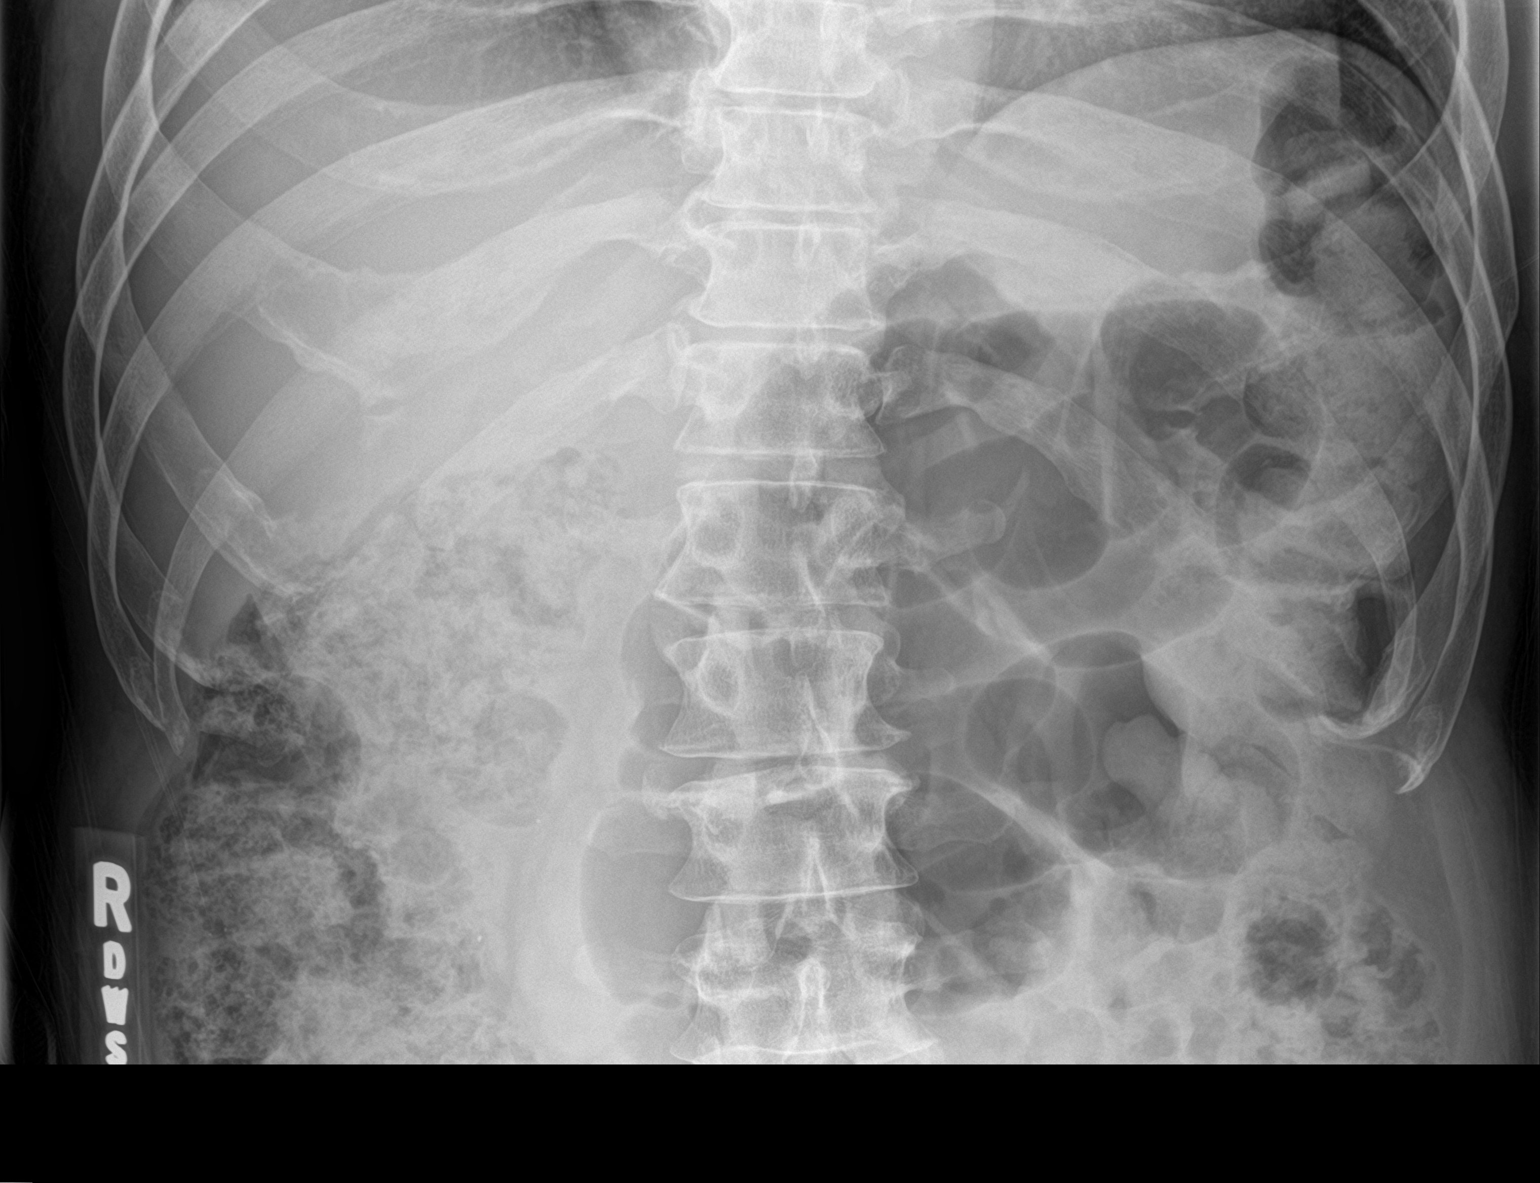

[abdomen kub (2 of 2)]
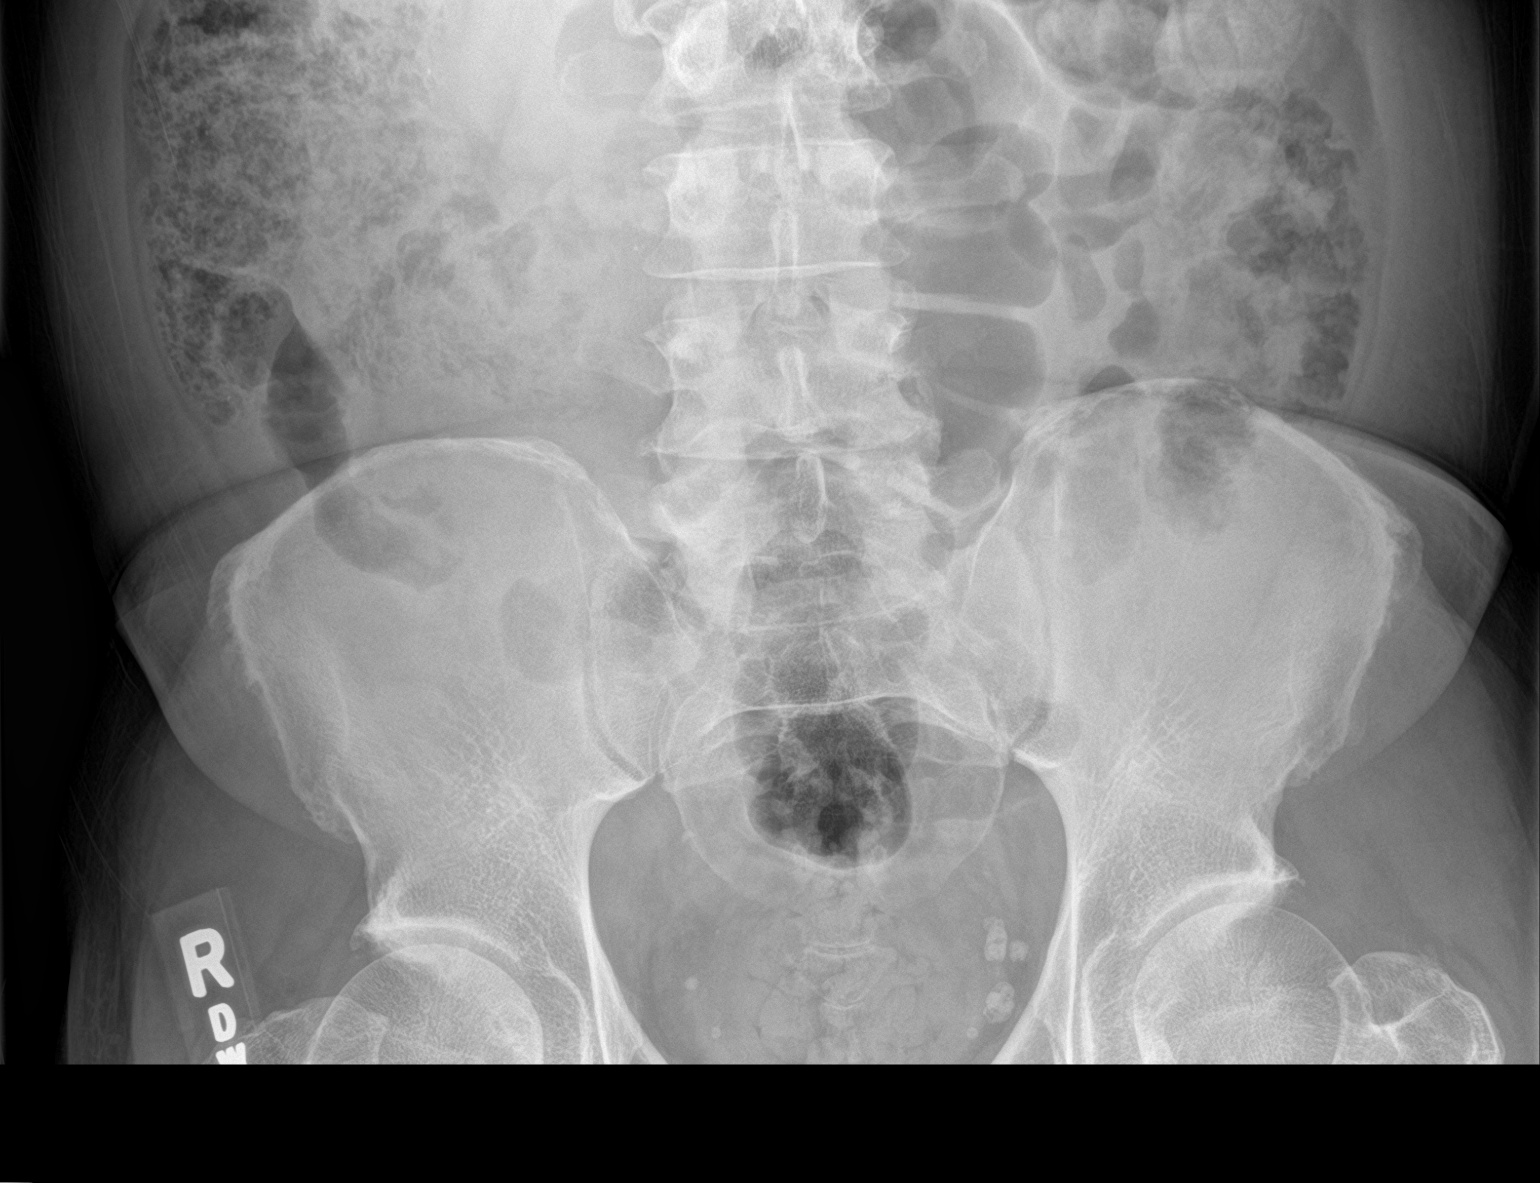

[2 of 2 positions shown; findings below may reference images not displayed]

FINDINGS: Large amount of stool throughout the colon and down into the rectum
suggesting constipation. No distended small bowel loops to suggest
obstruction. The soft tissue shadows are grossly maintained. No
worrisome calcifications. Numerous phleboliths are noted in the
pelvis. The bony structures are intact.
IMPRESSION: Large amount of stool throughout the colon and down into the rectum
suggesting constipation.

No findings for small bowel obstruction or free air.

## 2020-04-10 ENCOUNTER — Telehealth: Payer: Self-pay | Admitting: Family Medicine

## 2020-04-10 NOTE — Telephone Encounter (Signed)
I wasn't planning on it because if I order the A1c before 90 days has elapsed since last lab, people tend to get charged. We can discuss at the time of the appt, he does not nee to be fasting as his cholesterol was terrific on 3/30 and we will not be rechecking that. TY.

## 2020-04-10 NOTE — Telephone Encounter (Signed)
CallerKyriakos Foster Phone #: [phone]  Patient wants to know if he could have labs (dm) done on 04/20/20.

## 2020-04-11 NOTE — Telephone Encounter (Signed)
Called pt and lvm to return call.  

## 2020-04-13 ENCOUNTER — Other Ambulatory Visit: Payer: Self-pay | Admitting: Family Medicine

## 2020-04-13 ENCOUNTER — Ambulatory Visit (INDEPENDENT_AMBULATORY_CARE_PROVIDER_SITE_OTHER): Payer: 59

## 2020-04-13 ENCOUNTER — Ambulatory Visit: Payer: 59

## 2020-04-13 ENCOUNTER — Other Ambulatory Visit: Payer: Self-pay

## 2020-04-13 DIAGNOSIS — IMO0002 Reserved for concepts with insufficient information to code with codable children: Secondary | ICD-10-CM

## 2020-04-13 DIAGNOSIS — Z23 Encounter for immunization: Secondary | ICD-10-CM | POA: Diagnosis not present

## 2020-04-13 DIAGNOSIS — E11319 Type 2 diabetes mellitus with unspecified diabetic retinopathy without macular edema: Secondary | ICD-10-CM

## 2020-04-13 MED FILL — glipiZIDE 5 MG TABS: 5 | 30 days supply | Qty: 90 | Fill #0

## 2020-04-13 MED FILL — metFORMIN HCL ER 500 MG TB2: 500 | 30 days supply | Qty: 120 | Fill #0

## 2020-04-13 MED FILL — FREESTYLE LIBRE 14 DAY SENS: 84 days supply | Qty: 6 | Fill #0

## 2020-04-13 NOTE — Progress Notes (Signed)
Patient here today for shingrix vaccine. 0.5mL given in left deltoid IM. Patient tolerated well.  

## 2020-04-13 NOTE — Telephone Encounter (Signed)
Pt came in office stating is needing refills sent today to pharmacy since pt is leaving this Sunday out of town and is needing meds ASAP. Please advise.

## 2020-04-16 NOTE — Telephone Encounter (Signed)
Called informed the patient of PCP response to his question. He verbalized understanding.

## 2020-04-20 ENCOUNTER — Ambulatory Visit: Payer: 59 | Admitting: Family Medicine

## 2020-04-20 ENCOUNTER — Ambulatory Visit: Payer: 59

## 2020-05-11 ENCOUNTER — Encounter: Payer: Self-pay | Admitting: Family Medicine

## 2020-05-11 ENCOUNTER — Ambulatory Visit (INDEPENDENT_AMBULATORY_CARE_PROVIDER_SITE_OTHER): Payer: 59 | Admitting: Family Medicine

## 2020-05-11 ENCOUNTER — Other Ambulatory Visit: Payer: Self-pay | Admitting: Family Medicine

## 2020-05-11 ENCOUNTER — Other Ambulatory Visit: Payer: Self-pay

## 2020-05-11 VITALS — BP 140/84 | HR 85 | Temp 96.3°F | Ht 75.5 in | Wt 223.0 lb

## 2020-05-11 DIAGNOSIS — IMO0002 Reserved for concepts with insufficient information to code with codable children: Secondary | ICD-10-CM

## 2020-05-11 DIAGNOSIS — E1165 Type 2 diabetes mellitus with hyperglycemia: Secondary | ICD-10-CM

## 2020-05-11 DIAGNOSIS — I10 Essential (primary) hypertension: Secondary | ICD-10-CM

## 2020-05-11 DIAGNOSIS — E11319 Type 2 diabetes mellitus with unspecified diabetic retinopathy without macular edema: Secondary | ICD-10-CM

## 2020-05-11 DIAGNOSIS — E785 Hyperlipidemia, unspecified: Secondary | ICD-10-CM

## 2020-05-11 DIAGNOSIS — R491 Aphonia: Secondary | ICD-10-CM | POA: Diagnosis not present

## 2020-05-11 MED ORDER — LISINOPRIL 10 MG PO TABS: 10.0000 mg | ORAL_TABLET | Freq: Every day | ORAL | 1 refills | Status: DC

## 2020-05-11 MED ORDER — ATORVASTATIN CALCIUM 40 MG PO TABS: 40.0000 mg | ORAL_TABLET | Freq: Every day | ORAL | 3 refills | Status: DC

## 2020-05-11 MED ORDER — GLIPIZIDE 5 MG PO TABS: 5.0000 mg | ORAL_TABLET | Freq: Two times a day (BID) | ORAL | 1 refills | Status: DC

## 2020-05-11 MED ORDER — GLIPIZIDE 5 MG PO TABS: 5.0000 mg | ORAL_TABLET | Freq: Two times a day (BID) | ORAL | 0 refills | Status: DC

## 2020-05-11 MED ORDER — METFORMIN HCL ER 500 MG PO TB24: ORAL_TABLET | ORAL | 1 refills | Status: DC

## 2020-05-11 MED ORDER — LANTUS SOLOSTAR 100 UNIT/ML ~~LOC~~ SOPN: 18.0000 [IU] | PEN_INJECTOR | Freq: Every day | SUBCUTANEOUS | 3 refills | Status: DC

## 2020-05-11 MED FILL — METFORMIN HCL ER 500 MG TAB: 500 | 90 days supply | Qty: 360 | Fill #0

## 2020-05-11 MED FILL — LANTUS SOLOSTAR 100 UNITS/M: 100 | 84 days supply | Qty: 15 | Fill #0

## 2020-05-11 MED FILL — ATORVASTATIN CALCIUM 40 MG: 40 | 90 days supply | Qty: 90 | Fill #0

## 2020-05-11 NOTE — Patient Instructions (Signed)
Give [place] 2-3 business days to get the results of your labs back.   Keep the diet clean and stay active.  Aim to do some physical exertion for 150 minutes per week. This is typically divided into 5 days per week, 30 minutes per day. The activity should be enough to get your heart rate up. Anything is better than nothing if you have time constraints.  If the thyroid level is normal, we will set you up with the ENT team.  Let us know if you need anything.

## 2020-05-11 NOTE — Progress Notes (Signed)
Chief Complaint  Patient presents with  . Follow-up    Subjective: Patient is a 54 y.o. male here for DM f/u.  On Glipizide 5 mg in AM and 10 mg in PM, metformin XR 1000 mg bid, Lantus 15 u qhs, sometimes takes in AM. Sugars ranging from low 100's to mid 200's. Diet is more stable now, no change on sugars. No exercise.   Pt also notes a several year hx of intermittent voice loss. +famhx of thyroid issues, would like that checked. Has never seen ENT.   Past Medical History:  Diagnosis Date  . Allergy   . Diabetes mellitus without complication (HCC)   . H pylori ulcer   . Hyperlipidemia   . Hypertension   . Kidney stone   . Sleep apnea     Objective: BP 140/84 (BP Location: Left Arm, Patient Position: Sitting, Cuff Size: Large)   Pulse 85   Temp (!) 96.3 F (35.7 C) (Temporal)   Ht 6' 3.5" (1.918 m)   Wt 223 lb (101.2 kg)   SpO2 97%   BMI 27.51 kg/m  General: Awake, appears stated age Lungs: No accessory muscle use Psych: Age appropriate judgment and insight, normal affect and mood  Assessment and Plan: Uncontrolled type 2 diabetes mellitus with retinopathy, without long-term current use of insulin (HCC)  Loss of voice - Plan: TSH  Essential hypertension - Plan: lisinopril (ZESTRIL) 10 MG tablet  Hyperlipidemia, unspecified hyperlipidemia type - Plan: atorvastatin (LIPITOR) 40 MG tablet  1- 18 u nightly Lantus. Intermittent bid dosing is likiely affecting the stability of his sugars. Counseled on diet/exercise. 5 mg bid glipizide to avoid variance in sugars. Fu in 1 mo. 2- Ck TSH, if nml will refer to ENT for possible scope.  The patient voiced understanding and agreement to the plan.  Jilda Roche Humphrey, DO 05/11/20  3:10 PM

## 2020-05-15 ENCOUNTER — Other Ambulatory Visit: Payer: Self-pay | Admitting: Family Medicine

## 2020-05-15 ENCOUNTER — Other Ambulatory Visit (INDEPENDENT_AMBULATORY_CARE_PROVIDER_SITE_OTHER): Payer: 59

## 2020-05-15 ENCOUNTER — Other Ambulatory Visit: Payer: Self-pay

## 2020-05-15 ENCOUNTER — Telehealth: Payer: Self-pay | Admitting: Family Medicine

## 2020-05-15 DIAGNOSIS — R491 Aphonia: Secondary | ICD-10-CM | POA: Diagnosis not present

## 2020-05-15 LAB — TSH: TSH: 1.12 u[IU]/mL (ref 0.35–4.50)

## 2020-05-15 NOTE — Telephone Encounter (Signed)
CallerNymir Ringler  Call Back # [phone]  Patient called stating that lab needs to be drawn. Patient did not stop by lab on Friday 05/11/2020. Pt offered next available appointment for labs 05/23/2020.Patient would like to walk in to have labs, explained to patient that I would have to schedule appointment.  Patient states that is no late. Patient wishes to speak with Dr Carmelia Roller. Patient then wishes to speak with Practice Administor.

## 2020-05-15 NOTE — Addendum Note (Signed)
Addended by: Varney Biles on: 05/15/2020 01:41 PM   Modules accepted: Orders

## 2020-05-15 NOTE — Telephone Encounter (Signed)
Patient scheduled for lab draw June 22,2021 @ 1:40 @ [facility].

## 2020-06-05 MED FILL — LISINOPRIL 10 MG TABS: 10 | 90 days supply | Qty: 90 | Fill #0

## 2020-06-06 ENCOUNTER — Ambulatory Visit (INDEPENDENT_AMBULATORY_CARE_PROVIDER_SITE_OTHER): Payer: 59 | Admitting: Otolaryngology

## 2020-06-06 ENCOUNTER — Telehealth: Payer: Self-pay | Admitting: Family Medicine

## 2020-06-06 ENCOUNTER — Ambulatory Visit: Payer: 59 | Admitting: Family Medicine

## 2020-06-06 ENCOUNTER — Encounter (INDEPENDENT_AMBULATORY_CARE_PROVIDER_SITE_OTHER): Payer: Self-pay | Admitting: Otolaryngology

## 2020-06-06 ENCOUNTER — Other Ambulatory Visit: Payer: Self-pay

## 2020-06-06 VITALS — Temp 97.3°F

## 2020-06-06 DIAGNOSIS — J310 Chronic rhinitis: Secondary | ICD-10-CM

## 2020-06-06 DIAGNOSIS — R490 Dysphonia: Secondary | ICD-10-CM | POA: Diagnosis not present

## 2020-06-06 DIAGNOSIS — Z0289 Encounter for other administrative examinations: Secondary | ICD-10-CM

## 2020-06-06 DIAGNOSIS — K219 Gastro-esophageal reflux disease without esophagitis: Secondary | ICD-10-CM | POA: Diagnosis not present

## 2020-06-06 NOTE — Progress Notes (Addendum)
HPI: Jamie Foster is a 54 y.o. male who presents is referred by his PCP for evaluation of hoarseness that he has had problems with for years.  He has also had chronic sinus problems with swelling postnasal drainage.  He has had previous history of GE reflux disease and apparently had surgery for his GE reflux disease as well as surgery for his sinuses in 2003.  He has a raspy voice that he states gets worse when he talks a lot. He does not smoke. He states that since he changed his diet several years ago his reflux symptoms are much better and he does not presently take antiacid therapy. Denies any yellow-green discharge, paranasal pain or fever..  Past Medical History:  Diagnosis Date  . Allergy   . Diabetes mellitus without complication ([place])   . H pylori ulcer   . Hyperlipidemia   . Hypertension   . Kidney stone   . Sleep apnea    Past Surgical History:  Procedure Laterality Date  . NASAL SINUS SURGERY     Social History   Socioeconomic History  . Marital status: Married    Spouse name: Not on file  . Number of children: Not on file  . Years of education: Not on file  . Highest education level: Not on file  Occupational History  . Not on file  Tobacco Use  . Smoking status: Never Smoker  . Smokeless tobacco: Never Used  Vaping Use  . Vaping Use: Never used  Substance and Sexual Activity  . Alcohol use: No  . Drug use: No  . Sexual activity: Yes  Other Topics Concern  . Not on file  Social History Narrative   Married.Education: College/Other. Exercise: Walks.   Social Determinants of Health   Financial Resource Strain:   . Difficulty of Paying Living Expenses:   Food Insecurity:   . Worried About Charity fundraiser in the Last Year:   . Arboriculturist in the Last Year:   Transportation Needs:   . Film/video editor (Medical):   Marland Kitchen Lack of Transportation (Non-Medical):   Physical Activity:   . Days of Exercise per Week:   . Minutes of Exercise per  Session:   Stress:   . Feeling of Stress :   Social Connections:   . Frequency of Communication with Friends and Family:   . Frequency of Social Gatherings with Friends and Family:   . Attends Religious Services:   . Active Member of Clubs or Organizations:   . Attends Archivist Meetings:   Marland Kitchen Marital Status:    Family History  Problem Relation Age of Onset  . Hypertension Mother   . Prostate cancer Father   . Colon cancer Neg Hx   . Colon polyps Neg Hx    No Known Allergies Prior to Admission medications   Medication Sig Start Date End Date Taking? Authorizing Provider  atorvastatin (LIPITOR) 40 MG tablet Take 1 tablet (40 mg total) by mouth daily. 05/11/20  Yes Shelda Pal, DO  Blood Glucose Monitoring Suppl (FREESTYLE FREEDOM LITE) w/Device KIT Freestyle lite kit/device with test strips and lancets DX: E11.1 03/14/19  Yes Wendie Agreste, MD  Blood Glucose Monitoring Suppl (ONE TOUCH ULTRA 2) w/Device KIT Use to test blood sugar daily as instructed. ICD 10 E.11.8 10/30/18  Yes Scot Jun, FNP  Continuous Blood Gluc Receiver (FREESTYLE LIBRE 14 DAY READER) DEVI USE TO CHECK SUGARS. REPLACE AFTER 14 DAYS. 11/28/19  Yes Sharlene Dory, DO  Continuous Blood Gluc Sensor (FREESTYLE LIBRE 14 DAY SENSOR) MISC USE TO CHECK SUGARS REPLACE AFTER 14 DAYS 04/13/20  Yes Sharlene Dory, DO  glipiZIDE (GLUCOTROL) 5 MG tablet Take 1 tablet (5 mg total) by mouth 2 (two) times daily before a meal. 05/11/20  Yes Wendling, Jilda Roche, DO  glucose blood (TRUETRACK TEST) test strip Use 2x a day 05/17/18  Yes Shade Flood, MD  insulin glargine (LANTUS SOLOSTAR) 100 UNIT/ML Solostar Pen Inject 18 Units into the skin at bedtime. 05/11/20  Yes Sharlene Dory, DO  Insulin Pen Needle 32G X 4 MM MISC Use 1x a day 01/20/19  Yes Carlus Pavlov, MD  lisinopril (ZESTRIL) 10 MG tablet Take 1 tablet (10 mg total) by mouth daily. 05/11/20  Yes Sharlene Dory, DO  meloxicam (MOBIC) 15 MG tablet Take 1 tablet (15 mg total) by mouth daily. 01/21/19  Yes Georgina Quint, MD  metFORMIN (GLUCOPHAGE-XR) 500 MG 24 hr tablet TAKE 2 TABLETS BY MOUTH TWO TIMES DAILY 05/11/20  Yes Wendling, Jilda Roche, DO  [facility] DELICA LANCETS 33G MISC Use to test blood sugar 2 times daily as instructed. 01/24/15  Yes Carlus Pavlov, MD  polyethylene glycol (MIRALAX / GLYCOLAX) packet Take 17 g by mouth daily. 10/30/18  Yes Bing Neighbors, FNP  [facility] LANCETS MISC Use 2x a day - Tru Track 06/09/14  Yes Carlus Pavlov, MD     Positive ROS: Otherwise negative  All other systems have been reviewed and were otherwise negative with the exception of those mentioned in the HPI and as above.  Physical Exam: Constitutional: Alert, well-appearing, no acute distress Ears: External ears without lesions or tenderness. Ear canals are clear bilaterally with intact, clear TMs.  Nasal: External nose without lesions. Septum midline with moderate rhinitis and mucosal swelling.  After decongesting the nose both middle meatus regions were clear with no obvious mucopurulent discharge noted.  No polyps noted.. Clear nasal passages Oral: Lips and gums without lesions. Tongue and palate mucosa without lesions. Posterior oropharynx clear.  Tonsils are benign in appearance. Fiberoptic laryngoscopy was performed to the right nostril.  Nasopharynx was clear.  The base of tongue vallecula and epiglottis were normal.  Patient had a moderate amount of supraglottic mucus that cleared easily with coughing.  He also had diffuse edema of the arytenoid mucosa as well as the vocal cords bilaterally consistent with probable laryngeal pharyngeal reflux.  Vocal cords had normal mobility there were no vocal cord nodules or polyps or evidence of neoplasia. Neck: No palpable adenopathy or masses Respiratory: Breathing comfortably  Skin: No facial/neck lesions or rash  noted.  Laryngoscopy  Date/Time: 06/07/2020 2:02 PM Performed by: Drema Halon, MD Authorized by: Drema Halon, MD   Consent:    Consent obtained:  Verbal   Consent given by:  Patient Procedure details:    Indications: hoarseness, dysphagia, or aspiration     Medication:  Afrin   Instrument: flexible fiberoptic laryngoscope     Scope location: right nare   Sinus:    Right nasopharynx: normal   Mouth:    Oropharynx: normal     Vallecula: normal     Base of tongue: normal     Epiglottis: normal   Throat:    Pyriform sinus: normal     True vocal cords: normal   Comments:     On fiberoptic laryngoscopy patient had a moderate amount of supraglottic mucus as well as edema  of the arytenoid mucosa consistent with probable laryngeal pharyngeal reflux.  There is no evidence of infection or neoplasia.  Vocal cords had normal symmetric mobility.    Assessment: Hoarseness secondary to diffuse swelling of the vocal cord mucosa possibly related to laryngeal pharyngeal reflux. Chronic rhinitis  Plan: Placed him on omeprazole 40 mg daily before dinner for the next 2 months as he is going out of country for a month and a half.  In addition recommended regular use of Nasacort 2 sprays each nostril at night to help with nasal congestion. He will follow-up in 2 months for recheck. This apparently has been a chronic problem for him for a number of years.. Also discussed use of saline irrigations to help clean the nose and help reduce the mucus.  Buildup.   Radene Journey, MD   CC:

## 2020-06-06 NOTE — Telephone Encounter (Signed)
Patient states that he needs shingles vaccine give tomorrow instead of Monday Morning. Patient will leave to go out the country Friday morning. Patient given options to get vaccine earlier.Expressed to patient that we did not gave any appointments earlier than Monday morning.  Spoke with Zella Ball and Apolonio Schneiders  agreed to call patient in the morning to add patient to schedule. For an afternoon appointment. Or patient could go to pharmacy To obtain vaccine.

## 2020-06-07 ENCOUNTER — Ambulatory Visit (INDEPENDENT_AMBULATORY_CARE_PROVIDER_SITE_OTHER): Payer: 59 | Admitting: *Deleted

## 2020-06-07 ENCOUNTER — Telehealth: Payer: Self-pay | Admitting: *Deleted

## 2020-06-07 ENCOUNTER — Other Ambulatory Visit: Payer: Self-pay

## 2020-06-07 DIAGNOSIS — K219 Gastro-esophageal reflux disease without esophagitis: Secondary | ICD-10-CM | POA: Diagnosis not present

## 2020-06-07 DIAGNOSIS — R490 Dysphonia: Secondary | ICD-10-CM

## 2020-06-07 DIAGNOSIS — Z23 Encounter for immunization: Secondary | ICD-10-CM

## 2020-06-07 DIAGNOSIS — J310 Chronic rhinitis: Secondary | ICD-10-CM | POA: Diagnosis not present

## 2020-06-07 NOTE — Telephone Encounter (Signed)
Patient is going out of the country on Tuesday to bury his mother in [place] and he would like to follow up on on Monday 7/19.  Are you ok if I use the same day 1015 appt slot?

## 2020-06-07 NOTE — Progress Notes (Signed)
Patient here for second shingles vaccine.  Vaccine given in left deltoid and patient tolerated well.  

## 2020-06-08 NOTE — Telephone Encounter (Signed)
That's fine. Ty.  

## 2020-06-11 ENCOUNTER — Ambulatory Visit: Payer: 59 | Admitting: Family Medicine

## 2020-06-11 ENCOUNTER — Other Ambulatory Visit: Payer: Self-pay

## 2020-06-11 ENCOUNTER — Other Ambulatory Visit: Payer: Self-pay | Admitting: Family Medicine

## 2020-06-11 ENCOUNTER — Telehealth: Payer: Self-pay

## 2020-06-11 ENCOUNTER — Ambulatory Visit: Payer: 59

## 2020-06-11 ENCOUNTER — Encounter: Payer: Self-pay | Admitting: Family Medicine

## 2020-06-11 VITALS — BP 122/84 | HR 98 | Temp 98.3°F | Ht 75.0 in | Wt 225.0 lb

## 2020-06-11 DIAGNOSIS — E11319 Type 2 diabetes mellitus with unspecified diabetic retinopathy without macular edema: Secondary | ICD-10-CM | POA: Diagnosis not present

## 2020-06-11 DIAGNOSIS — E1165 Type 2 diabetes mellitus with hyperglycemia: Secondary | ICD-10-CM | POA: Diagnosis not present

## 2020-06-11 DIAGNOSIS — IMO0002 Reserved for concepts with insufficient information to code with codable children: Secondary | ICD-10-CM

## 2020-06-11 LAB — HEMOGLOBIN A1C: Hgb A1c MFr Bld: 9.3 % — ABNORMAL HIGH (ref 4.6–6.5)

## 2020-06-11 MED ORDER — DEXCOM G6 TRANSMITTER MISC: 1.0000 | 3 refills | Status: DC

## 2020-06-11 MED ORDER — BASAGLAR KWIKPEN 100 UNIT/ML ~~LOC~~ SOPN: 18.0000 [IU] | PEN_INJECTOR | Freq: Every day | SUBCUTANEOUS | 3 refills | Status: DC

## 2020-06-11 MED ORDER — DEXCOM G6 SENSOR MISC: 1.0000 | 3 refills | Status: DC

## 2020-06-11 MED ORDER — FREESTYLE LIBRE 14 DAY SENSOR MISC: 2 refills | Status: DC

## 2020-06-11 MED ORDER — FREESTYLE LIBRE 14 DAY READER DEVI: 0 refills | Status: DC

## 2020-06-11 MED ORDER — DEXCOM G6 RECEIVER DEVI: 1.0000 | 0 refills | Status: DC

## 2020-06-11 MED ORDER — GLIPIZIDE 10 MG PO TABS: 10.0000 mg | ORAL_TABLET | Freq: Two times a day (BID) | ORAL | 2 refills | Status: DC

## 2020-06-11 MED ORDER — LANTUS SOLOSTAR 100 UNIT/ML ~~LOC~~ SOPN: 18.0000 [IU] | PEN_INJECTOR | Freq: Every day | SUBCUTANEOUS | 3 refills | Status: DC

## 2020-06-11 MED FILL — glipiZIDE 10 MG TABS: 10 | 90 days supply | Qty: 180 | Fill #0

## 2020-06-11 MED FILL — OMEPRAZOLE 40 MG CPDR: 40 | 60 days supply | Qty: 60 | Fill #0

## 2020-06-11 MED FILL — TRIAMCINOLONE ACETONIDE 55: 55 | 60 days supply | Qty: 34 | Fill #0

## 2020-06-11 MED FILL — BASAGLAR 100 UNIT/ML KWIKPE: 100 | 83 days supply | Qty: 15 | Fill #0

## 2020-06-11 MED FILL — FREESTYLE LIBRE 14 DAY SENS: 84 days supply | Qty: 6 | Fill #1

## 2020-06-11 NOTE — Telephone Encounter (Signed)
Freestyle Libre system not covered. Preferred system is Dexcom G6.

## 2020-06-11 NOTE — Patient Instructions (Signed)
Try to keep your meals relatively consistent.   Give [place] 2-3 business days to get the results of your labs back.   Keep the diet clean and stay active.  Continue to track your sugars and relation with meals.  Let us know if you need anything.

## 2020-06-11 NOTE — Addendum Note (Signed)
Addended by: Scharlene Gloss B on: 06/11/2020 04:17 PM   Modules accepted: Orders

## 2020-06-11 NOTE — Progress Notes (Signed)
Subjective:   Chief Complaint  Patient presents with  . Follow-up    medication    Jamie Foster is a 54 y.o. male here for follow-up of diabetes.   Jamie Foster's self monitored glucose range is high 100's, sugars spike after meals Patient denies freq hypoglycemic reactions. He has a cont monitor.   Patient does require insulin- Lantus 18 u qhs   Medications include: Metformin XR 1000 mg bid, glipizide 5 mg bid Diet is fair.    Past Medical History:  Diagnosis Date  . Allergy   . Diabetes mellitus without complication (HCC)   . H pylori ulcer   . Hyperlipidemia   . Hypertension   . Kidney stone   . Sleep apnea      Related testing: Date of retinal exam: Done Pneumovax: done  Objective:  BP 122/84 (BP Location: Left Arm, Patient Position: Sitting, Cuff Size: Normal)   Pulse 98   Temp 98.3 F (36.8 C) (Oral)   Ht 6\' 3"  (1.905 m)   Wt 225 lb (102.1 kg)   SpO2 97%   BMI 28.12 kg/m  General:  Well developed, well nourished, in no apparent distress Lungs:  No access msc use Neuro:  Gait nml Psych: Age appropriate judgment and insight  Assessment:   Uncontrolled type 2 diabetes mellitus with retinopathy, without long-term current use of insulin (HCC) - Plan: Hemoglobin A1c, Continuous Blood Gluc Sensor (FREESTYLE LIBRE 14 DAY SENSOR) MISC   Plan:   Counseled on diet and exercise. Due to formulary change, will order Basaglar instead of Lantus. Increase Glipizide from 5 mg bid to 10 mg bid. Cont Metformin. He is going out of country for 2 mo. When he returns, will reexamine sugars. If still high, will trial Trulicity to limit post prandial spikes. If not, will have to consider mealtime insuiln.  F/u in 2 mo. The patient voiced understanding and agreement to the plan.  [place], DO 06/11/20 11:51 AM

## 2020-06-11 NOTE — Telephone Encounter (Signed)
Sent in the DexcomG6 with supplies to the [facility]

## 2020-06-11 NOTE — Telephone Encounter (Signed)
Yes

## 2020-06-11 NOTE — Telephone Encounter (Signed)
Ok to change

## 2020-06-13 ENCOUNTER — Ambulatory Visit: Payer: 59 | Attending: Internal Medicine

## 2020-06-13 ENCOUNTER — Other Ambulatory Visit: Payer: Self-pay | Admitting: *Deleted

## 2020-06-13 DIAGNOSIS — Z20822 Contact with and (suspected) exposure to covid-19: Secondary | ICD-10-CM

## 2020-06-14 LAB — SARS-COV-2, NAA 2 DAY TAT

## 2020-06-14 LAB — NOVEL CORONAVIRUS, NAA: SARS-CoV-2, NAA: NOT DETECTED

## 2020-06-19 ENCOUNTER — Telehealth: Payer: Self-pay

## 2020-06-19 NOTE — Telephone Encounter (Signed)
PA was initiated on 06/11/2020 for Dexcom continuous glucometer kit. Unsure of PA determination.

## 2020-06-25 NOTE — Telephone Encounter (Signed)
PA approved.

## 2020-08-15 ENCOUNTER — Ambulatory Visit (INDEPENDENT_AMBULATORY_CARE_PROVIDER_SITE_OTHER): Payer: 59 | Admitting: Otolaryngology

## 2020-08-20 ENCOUNTER — Ambulatory Visit: Payer: 59 | Admitting: Family Medicine

## 2020-08-20 ENCOUNTER — Telehealth: Payer: Self-pay | Admitting: Family Medicine

## 2020-08-20 ENCOUNTER — Encounter: Payer: Self-pay | Admitting: Family Medicine

## 2020-08-20 ENCOUNTER — Other Ambulatory Visit: Payer: Self-pay

## 2020-08-20 VITALS — BP 152/90 | HR 97 | Temp 98.2°F | Ht 75.0 in | Wt 223.2 lb

## 2020-08-20 DIAGNOSIS — E1165 Type 2 diabetes mellitus with hyperglycemia: Secondary | ICD-10-CM

## 2020-08-20 DIAGNOSIS — M542 Cervicalgia: Secondary | ICD-10-CM

## 2020-08-20 DIAGNOSIS — M545 Low back pain, unspecified: Secondary | ICD-10-CM

## 2020-08-20 MED ORDER — LANTUS SOLOSTAR 100 UNIT/ML ~~LOC~~ SOPN: 18.0000 [IU] | PEN_INJECTOR | Freq: Every day | SUBCUTANEOUS | 11 refills | Status: DC

## 2020-08-20 MED FILL — LANTUS SOLOSTAR 100 UNITS/M: 100 | 84 days supply | Qty: 15 | Fill #0

## 2020-08-20 NOTE — Patient Instructions (Addendum)
Heat (pad or rice pillow in microwave) over affected area, 10-15 minutes twice daily.   Ice/cold pack over area for 10-15 min twice daily.  Ibuprofen 400-600 mg (2-3 over the counter strength tabs) every 6 hours as needed for pain.  OK to take Tylenol 1000 mg (2 extra strength tabs) or 975 mg (3 regular strength tabs) every 6 hours as needed.  Let us know if you need anything.  EXERCISES RANGE OF MOTION (ROM) AND STRETCHING EXERCISES  These exercises may help you when beginning to rehabilitate your issue. In order to successfully resolve your symptoms, you must improve your posture. These exercises are designed to help reduce the forward-head and rounded-shoulder posture which contributes to this condition. Your symptoms may resolve with or without further involvement from your physician, physical therapist or athletic trainer. While completing these exercises, remember:   Restoring tissue flexibility helps normal motion to return to the joints. This allows healthier, less painful movement and activity.  An effective stretch should be held for at least 20 seconds, although you may need to begin with shorter hold times for comfort.  A stretch should never be painful. You should only feel a gentle lengthening or release in the stretched tissue.  Do not do any stretch or exercise that you cannot tolerate.  STRETCH- Axial Extensors  Lie on your back on the floor. You may bend your knees for comfort. Place a rolled-up hand towel or dish towel, about 2 inches in diameter, under the part of your head that makes contact with the floor.  Gently tuck your chin, as if trying to make a "double chin," until you feel a gentle stretch at the base of your head.  Hold 15-20 seconds. Repeat 2-3 times. Complete this exercise 1 time per day.   STRETCH - Axial Extension   Stand or sit on a firm surface. Assume a good posture: chest up, shoulders drawn back, abdominal muscles slightly tense, knees  unlocked (if standing) and feet hip width apart.  Slowly retract your chin so your head slides back and your chin slightly lowers. Continue to look straight ahead.  You should feel a gentle stretch in the back of your head. Be certain not to feel an aggressive stretch since this can cause headaches later.  Hold for 15-20 seconds. Repeat 2-3 times. Complete this exercise 1 time per day.  STRETCH - Cervical Side Bend   Stand or sit on a firm surface. Assume a good posture: chest up, shoulders drawn back, abdominal muscles slightly tense, knees unlocked (if standing) and feet hip width apart.  Without letting your nose or shoulders move, slowly tip your right / left ear to your shoulder until your feel a gentle stretch in the muscles on the opposite side of your neck.  Hold 15-20 seconds. Repeat 2-3 times. Complete this exercise 1-2 times per day.  STRETCH - Cervical Rotators   Stand or sit on a firm surface. Assume a good posture: chest up, shoulders drawn back, abdominal muscles slightly tense, knees unlocked (if standing) and feet hip width apart.  Keeping your eyes level with the ground, slowly turn your head until you feel a gentle stretch along the back and opposite side of your neck.  Hold 15-20 seconds. Repeat 2-3 times. Complete this exercise 1-2 times per day.  RANGE OF MOTION - Neck Circles   Stand or sit on a firm surface. Assume a good posture: chest up, shoulders drawn back, abdominal muscles slightly tense, knees unlocked (if standing)  and feet hip width apart.  Gently roll your head down and around from the back of one shoulder to the back of the other. The motion should never be forced or painful.  Repeat the motion 10-20 times, or until you feel the neck muscles relax and loosen. Repeat 2-3 times. Complete the exercise 1-2 times per day. STRENGTHENING EXERCISES - Cervical Strain and Sprain These exercises may help you when beginning to rehabilitate your injury. They  may resolve your symptoms with or without further involvement from your physician, physical therapist, or athletic trainer. While completing these exercises, remember:   Muscles can gain both the endurance and the strength needed for everyday activities through controlled exercises.  Complete these exercises as instructed by your physician, physical therapist, or athletic trainer. Progress the resistance and repetitions only as guided.  You may experience muscle soreness or fatigue, but the pain or discomfort you are trying to eliminate should never worsen during these exercises. If this pain does worsen, stop and make certain you are following the directions exactly. If the pain is still present after adjustments, discontinue the exercise until you can discuss the trouble with your clinician.  STRENGTH - Cervical Flexors, Isometric  Face a wall, standing about 6 inches away. Place a small pillow, a ball about 6-8 inches in diameter, or a folded towel between your forehead and the wall.  Slightly tuck your chin and gently push your forehead into the soft object. Push only with mild to moderate intensity, building up tension gradually. Keep your jaw and forehead relaxed.  Hold 10 to 20 seconds. Keep your breathing relaxed.  Release the tension slowly. Relax your neck muscles completely before you start the next repetition. Repeat 2-3 times. Complete this exercise 1 time per day.  STRENGTH- Cervical Lateral Flexors, Isometric   Stand about 6 inches away from a wall. Place a small pillow, a ball about 6-8 inches in diameter, or a folded towel between the side of your head and the wall.  Slightly tuck your chin and gently tilt your head into the soft object. Push only with mild to moderate intensity, building up tension gradually. Keep your jaw and forehead relaxed.  Hold 10 to 20 seconds. Keep your breathing relaxed.  Release the tension slowly. Relax your neck muscles completely before you  start the next repetition. Repeat 2-3 times. Complete this exercise 1 time per day.  STRENGTH - Cervical Extensors, Isometric   Stand about 6 inches away from a wall. Place a small pillow, a ball about 6-8 inches in diameter, or a folded towel between the back of your head and the wall.  Slightly tuck your chin and gently tilt your head back into the soft object. Push only with mild to moderate intensity, building up tension gradually. Keep your jaw and forehead relaxed.  Hold 10 to 20 seconds. Keep your breathing relaxed.  Release the tension slowly. Relax your neck muscles completely before you start the next repetition. Repeat 2-3 times. Complete this exercise 1 time per day.  POSTURE AND BODY MECHANICS CONSIDERATIONS Keeping correct posture when sitting, standing or completing your activities will reduce the stress put on different body tissues, allowing injured tissues a chance to heal and limiting painful experiences. The following are general guidelines for improved posture. Your physician or physical therapist will provide you with any instructions specific to your needs. While reading these guidelines, remember:  The exercises prescribed by your provider will help you have the flexibility and strength to maintain  correct postures.  The correct posture provides the optimal environment for your joints to work. All of your joints have less wear and tear when properly supported by a spine with good posture. This means you will experience a healthier, less painful body.  Correct posture must be practiced with all of your activities, especially prolonged sitting and standing. Correct posture is as important when doing repetitive low-stress activities (typing) as it is when doing a single heavy-load activity (lifting).  PROLONGED STANDING WHILE SLIGHTLY LEANING FORWARD When completing a task that requires you to lean forward while standing in one place for a long time, place either foot up  on a stationary 2- to 4-inch high object to help maintain the best posture. When both feet are on the ground, the low back tends to lose its slight inward curve. If this curve flattens (or becomes too large), then the back and your other joints will experience too much stress, fatigue more quickly, and can cause pain.   RESTING POSITIONS Consider which positions are most painful for you when choosing a resting position. If you have pain with flexion-based activities (sitting, bending, stooping, squatting), choose a position that allows you to rest in a less flexed posture. You would want to avoid curling into a fetal position on your side. If your pain worsens with extension-based activities (prolonged standing, working overhead), avoid resting in an extended position such as sleeping on your stomach. Most people will find more comfort when they rest with their spine in a more neutral position, neither too rounded nor too arched. Lying on a non-sagging bed on your side with a pillow between your knees, or on your back with a pillow under your knees will often provide some relief. Keep in mind, being in any one position for a prolonged period of time, no matter how correct your posture, can still lead to stiffness.  WALKING Walk with an upright posture. Your ears, shoulders, and hips should all line up. OFFICE WORK When working at a desk, create an environment that supports good, upright posture. Without extra support, muscles fatigue and lead to excessive strain on joints and other tissues.  CHAIR:  A chair should be able to slide under your desk when your back makes contact with the back of the chair. This allows you to work closely.  The chair's height should allow your eyes to be level with the upper part of your monitor and your hands to be slightly lower than your elbows.  Body position: ? Your feet should make contact with the floor. If this is not possible, use a foot rest. ? Keep your ears  over your shoulders. This will reduce stress on your neck and low back.  EXERCISES  RANGE OF MOTION (ROM) AND STRETCHING EXERCISES - Low Back Pain Most people with lower back pain will find that their symptoms get worse with excessive bending forward (flexion) or arching at the lower back (extension). The exercises that will help resolve your symptoms will focus on the opposite motion.  If you have pain, numbness or tingling which travels down into your buttocks, leg or foot, the goal of the therapy is for these symptoms to move closer to your back and eventually resolve. Sometimes, these leg symptoms will get better, but your lower back pain may worsen. This is often an indication of progress in your rehabilitation. Be very alert to any changes in your symptoms and the activities in which you participated in the 24 hours prior to  the change. Sharing this information with your caregiver will allow him or her to most efficiently treat your condition. These exercises may help you when beginning to rehabilitate your injury. Your symptoms may resolve with or without further involvement from your physician, physical therapist or athletic trainer. While completing these exercises, remember:   Restoring tissue flexibility helps normal motion to return to the joints. This allows healthier, less painful movement and activity.  An effective stretch should be held for at least 30 seconds.  A stretch should never be painful. You should only feel a gentle lengthening or release in the stretched tissue. FLEXION RANGE OF MOTION AND STRETCHING EXERCISES:  STRETCH - Flexion, Single Knee to Chest   Lie on a firm bed or floor with both legs extended in front of you.  Keeping one leg in contact with the floor, bring your opposite knee to your chest. Hold your leg in place by either grabbing behind your thigh or at your knee.  Pull until you feel a gentle stretch in your low back. Hold 30 seconds.  Slowly release  your grasp and repeat the exercise with the opposite side. Repeat 2 times. Complete this exercise 3 times per week.   STRETCH - Flexion, Double Knee to Chest  Lie on a firm bed or floor with both legs extended in front of you.  Keeping one leg in contact with the floor, bring your opposite knee to your chest.  Tense your stomach muscles to support your back and then lift your other knee to your chest. Hold your legs in place by either grabbing behind your thighs or at your knees.  Pull both knees toward your chest until you feel a gentle stretch in your low back. Hold 30 seconds.  Tense your stomach muscles and slowly return one leg at a time to the floor. Repeat 2 times. Complete this exercise 3 times per week.   STRETCH - Low Trunk Rotation  Lie on a firm bed or floor. Keeping your legs in front of you, bend your knees so they are both pointed toward the ceiling and your feet are flat on the floor.  Extend your arms out to the side. This will stabilize your upper body by keeping your shoulders in contact with the floor.  Gently and slowly drop both knees together to one side until you feel a gentle stretch in your low back. Hold for 30 seconds.  Tense your stomach muscles to support your lower back as you bring your knees back to the starting position. Repeat the exercise to the other side. Repeat 2 times. Complete this exercise at least 3 times per week.   EXTENSION RANGE OF MOTION AND FLEXIBILITY EXERCISES:  STRETCH - Extension, Prone on Elbows   Lie on your stomach on the floor, a bed will be too soft. Place your palms about shoulder width apart and at the height of your head.  Place your elbows under your shoulders. If this is too painful, stack pillows under your chest.  Allow your body to relax so that your hips drop lower and make contact more completely with the floor.  Hold this position for 30 seconds.  Slowly return to lying flat on the floor. Repeat 2 times.  Complete this exercise 3 times per week.   RANGE OF MOTION - Extension, Prone Press Ups  Lie on your stomach on the floor, a bed will be too soft. Place your palms about shoulder width apart and at the height of  your head.  Keeping your back as relaxed as possible, slowly straighten your elbows while keeping your hips on the floor. You may adjust the placement of your hands to maximize your comfort. As you gain motion, your hands will come more underneath your shoulders.  Hold this position 30 seconds.  Slowly return to lying flat on the floor. Repeat 2 times. Complete this exercise 3 times per week.   RANGE OF MOTION- Quadruped, Neutral Spine   Assume a hands and knees position on a firm surface. Keep your hands under your shoulders and your knees under your hips. You may place padding under your knees for comfort.  Drop your head and point your tailbone toward the ground below you. This will round out your lower back like an angry cat. Hold this position for 30 seconds.  Slowly lift your head and release your tail bone so that your back sags into a large arch, like an old horse.  Hold this position for 30 seconds.  Repeat this until you feel limber in your low back.  Now, find your "sweet spot." This will be the most comfortable position somewhere between the two previous positions. This is your neutral spine. Once you have found this position, tense your stomach muscles to support your low back.  Hold this position for 30 seconds. Repeat 2 times. Complete this exercise 3 times per week.   STRENGTHENING EXERCISES - Low Back Sprain These exercises may help you when beginning to rehabilitate your injury. These exercises should be done near your "sweet spot." This is the neutral, low-back arch, somewhere between fully rounded and fully arched, that is your least painful position. When performed in this safe range of motion, these exercises can be used for people who have either a  flexion or extension based injury. These exercises may resolve your symptoms with or without further involvement from your physician, physical therapist or athletic trainer. While completing these exercises, remember:   Muscles can gain both the endurance and the strength needed for everyday activities through controlled exercises.  Complete these exercises as instructed by your physician, physical therapist or athletic trainer. Increase the resistance and repetitions only as guided.  You may experience muscle soreness or fatigue, but the pain or discomfort you are trying to eliminate should never worsen during these exercises. If this pain does worsen, stop and make certain you are following the directions exactly. If the pain is still present after adjustments, discontinue the exercise until you can discuss the trouble with your caregiver.  STRENGTHENING - Deep Abdominals, Pelvic Tilt   Lie on a firm bed or floor. Keeping your legs in front of you, bend your knees so they are both pointed toward the ceiling and your feet are flat on the floor.  Tense your lower abdominal muscles to press your low back into the floor. This motion will rotate your pelvis so that your tail bone is scooping upwards rather than pointing at your feet or into the floor. With a gentle tension and even breathing, hold this position for 3 seconds. Repeat 2 times. Complete this exercise 3 times per week.   STRENGTHENING - Abdominals, Crunches   Lie on a firm bed or floor. Keeping your legs in front of you, bend your knees so they are both pointed toward the ceiling and your feet are flat on the floor. Cross your arms over your chest.  Slightly tip your chin down without bending your neck.  Tense your abdominals and slowly lift  your trunk high enough to just clear your shoulder blades. Lifting higher can put excessive stress on the lower back and does not further strengthen your abdominal muscles.  Control your return  to the starting position. Repeat 2 times. Complete this exercise 3 times per week.   STRENGTHENING - Quadruped, Opposite UE/LE Lift   Assume a hands and knees position on a firm surface. Keep your hands under your shoulders and your knees under your hips. You may place padding under your knees for comfort.  Find your neutral spine and gently tense your abdominal muscles so that you can maintain this position. Your shoulders and hips should form a rectangle that is parallel with the floor and is not twisted.  Keeping your trunk steady, lift your right hand no higher than your shoulder and then your left leg no higher than your hip. Make sure you are not holding your breath. Hold this position for 30 seconds.  Continuing to keep your abdominal muscles tense and your back steady, slowly return to your starting position. Repeat with the opposite arm and leg. Repeat 2 times. Complete this exercise 3 times per week.   STRENGTHENING - Abdominals and Quadriceps, Straight Leg Raise   Lie on a firm bed or floor with both legs extended in front of you.  Keeping one leg in contact with the floor, bend the other knee so that your foot can rest flat on the floor.  Find your neutral spine, and tense your abdominal muscles to maintain your spinal position throughout the exercise.  Slowly lift your straight leg off the floor about 6 inches for a count of 3, making sure to not hold your breath.  Still keeping your neutral spine, slowly lower your leg all the way to the floor. Repeat this exercise with each leg 2 times. Complete this exercise 3 times per week.  POSTURE AND BODY MECHANICS CONSIDERATIONS - Low Back Sprain Keeping correct posture when sitting, standing or completing your activities will reduce the stress put on different body tissues, allowing injured tissues a chance to heal and limiting painful experiences. The following are general guidelines for improved posture.  While reading these  guidelines, remember:  The exercises prescribed by your provider will help you have the flexibility and strength to maintain correct postures.  The correct posture provides the best environment for your joints to work. All of your joints have less wear and tear when properly supported by a spine with good posture. This means you will experience a healthier, less painful body.  Correct posture must be practiced with all of your activities, especially prolonged sitting and standing. Correct posture is as important when doing repetitive low-stress activities (typing) as it is when doing a single heavy-load activity (lifting).  RESTING POSITIONS Consider which positions are most painful for you when choosing a resting position. If you have pain with flexion-based activities (sitting, bending, stooping, squatting), choose a position that allows you to rest in a less flexed posture. You would want to avoid curling into a fetal position on your side. If your pain worsens with extension-based activities (prolonged standing, working overhead), avoid resting in an extended position such as sleeping on your stomach. Most people will find more comfort when they rest with their spine in a more neutral position, neither too rounded nor too arched. Lying on a non-sagging bed on your side with a pillow between your knees, or on your back with a pillow under your knees will often provide some relief.  Keep in mind, being in any one position for a prolonged period of time, no matter how correct your posture, can still lead to stiffness.  PROPER SITTING POSTURE In order to minimize stress and discomfort on your spine, you must sit with correct posture. Sitting with good posture should be effortless for a healthy body. Returning to good posture is a gradual process. Many people can work toward this most comfortably by using various supports until they have the flexibility and strength to maintain this posture on their  own. When sitting with proper posture, your ears will fall over your shoulders and your shoulders will fall over your hips. You should use the back of the chair to support your upper back. Your lower back will be in a neutral position, just slightly arched. You may place a small pillow or folded towel at the base of your lower back for  support.  When working at a desk, create an environment that supports good, upright posture. Without extra support, muscles tire, which leads to excessive strain on joints and other tissues. Keep these recommendations in mind:  CHAIR:  A chair should be able to slide under your desk when your back makes contact with the back of the chair. This allows you to work closely.  The chair's height should allow your eyes to be level with the upper part of your monitor and your hands to be slightly lower than your elbows.  BODY POSITION  Your feet should make contact with the floor. If this is not possible, use a foot rest.  Keep your ears over your shoulders. This will reduce stress on your neck and low back.  INCORRECT SITTING POSTURES  If you are feeling tired and unable to assume a healthy sitting posture, do not slouch or slump. This puts excessive strain on your back tissues, causing more damage and pain. Healthier options include:  Using more support, like a lumbar pillow.  Switching tasks to something that requires you to be upright or walking.  Talking a brief walk.  Lying down to rest in a neutral-spine position.  PROLONGED STANDING WHILE SLIGHTLY LEANING FORWARD  When completing a task that requires you to lean forward while standing in one place for a long time, place either foot up on a stationary 2-4 inch high object to help maintain the best posture. When both feet are on the ground, the lower back tends to lose its slight inward curve. If this curve flattens (or becomes too large), then the back and your other joints will experience too much  stress, tire more quickly, and can cause pain.  CORRECT STANDING POSTURES Proper standing posture should be assumed with all daily activities, even if they only take a few moments, like when brushing your teeth. As in sitting, your ears should fall over your shoulders and your shoulders should fall over your hips. You should keep a slight tension in your abdominal muscles to brace your spine. Your tailbone should point down to the ground, not behind your body, resulting in an over-extended swayback posture.   INCORRECT STANDING POSTURES  Common incorrect standing postures include a forward head, locked knees and/or an excessive swayback. WALKING Walk with an upright posture. Your ears, shoulders and hips should all line-up.  PROLONGED ACTIVITY IN A FLEXED POSITION When completing a task that requires you to bend forward at your waist or lean over a low surface, try to find a way to stabilize 3 out of 4 of your limbs. You  can place a hand or elbow on your thigh or rest a knee on the surface you are reaching across. This will provide you more stability, so that your muscles do not tire as quickly. By keeping your knees relaxed, or slightly bent, you will also reduce stress across your lower back. CORRECT LIFTING TECHNIQUES  DO :  Assume a wide stance. This will provide you more stability and the opportunity to get as close as possible to the object which you are lifting.  Tense your abdominals to brace your spine. Bend at the knees and hips. Keeping your back locked in a neutral-spine position, lift using your leg muscles. Lift with your legs, keeping your back straight.  Test the weight of unknown objects before attempting to lift them.  Try to keep your elbows locked down at your sides in order get the best strength from your shoulders when carrying an object.     Always ask for help when lifting heavy or awkward objects. INCORRECT LIFTING TECHNIQUES DO NOT:   Lock your knees when  lifting, even if it is a small object.  Bend and twist. Pivot at your feet or move your feet when needing to change directions.  Assume that you can safely pick up even a paperclip without proper posture.

## 2020-08-20 NOTE — Progress Notes (Signed)
Subjective:   Chief Complaint  Patient presents with  . Follow-up    Jamie Foster is a 54 y.o. male here for follow-up of diabetes.   Jamie Foster's self monitored glucose range is high 100's to low 200's.  Patient denies hypoglycemic reactions. Patient does require insulin. Basaglar 18 u qhs Medications include: glipizide 10 mg bid, Metformin XR 1000 mg bid Diet is healthy.   Exercise: walking  3 weeks of left lower back pain.  No change or injury that he can think of.  No neurologic signs or symptoms.  He has tried Advil that has been helpful.  No bruising, redness, or swelling.  No loss of control of bowel or bladder function.  Left-sided neck pain over the past several weeks as well.  No injury or change activity.  Numbness/tingling will radiate down his left arm.  No bruising, redness, or swelling.  Past Medical History:  Diagnosis Date  . Allergy   . Diabetes mellitus without complication (HCC)   . H pylori ulcer   . Hyperlipidemia   . Hypertension   . Kidney stone   . Sleep apnea     Objective:  BP (!) 152/90 (BP Location: Right Arm, Patient Position: Sitting, Cuff Size: Normal)   Pulse 97   Temp 98.2 F (36.8 C) (Oral)   Ht 6\' 3"  (1.905 m)   Wt 223 lb 4 oz (101.3 kg)   SpO2 95%   BMI 27.90 kg/m  General:  Well developed, well nourished, in no apparent distress Skin:  Warm, no pallor or diaphoresis Head:  Normocephalic, atraumatic Eyes:  Pupils equal and round, sclera anicteric without injection  Lungs:  CTAB, no access msc use Cardio:  RRR, no bruits, no LE edema Musculoskeletal: TTP over the left erector spinae group in lumbar region and left lumbar paraspinal musculature; poor flexibility of the hamstrings bilaterally Neuro: DTRs equal and symmetric in the upper and lower extremities without clonus; no cerebellar signs, gait is normal, negative straight leg bilaterally, negative Spurling's bilaterally Psych: Age appropriate judgment and insight  Assessment:    Uncontrolled type 2 diabetes mellitus with hyperglycemia (HCC)  Neck pain  Acute left-sided low back pain without sciatica   Plan:   1.  Reorder Lantus.  Monitor sugars at home.  Counseled on diet and exercise. 2.  Stretches and exercises, heat, ice, Tylenol. 3.  Stretches and exercises, heat, ice, Tylenol.  If no improvement, will refer to physical therapy. F/u in 1 mo. The patient voiced understanding and agreement to the plan.  [place], DO 08/20/20 9:15 AM

## 2020-08-20 NOTE — Telephone Encounter (Signed)
Patient sates he needs exercises for his neck and pelvis  Sent to his email. @ [email or web address]

## 2020-08-20 NOTE — Telephone Encounter (Signed)
Patient was given AVS after his OV//exercises included.

## 2020-08-29 MED FILL — metFORMIN HCL ER 500 MG TB2: 500 | 90 days supply | Qty: 360 | Fill #1

## 2020-08-29 MED FILL — FREESTYLE LIBRE 14 DAY SENS: 84 days supply | Qty: 6 | Fill #2

## 2020-09-04 ENCOUNTER — Telehealth: Payer: Self-pay

## 2020-09-04 ENCOUNTER — Other Ambulatory Visit: Payer: Self-pay | Admitting: Family Medicine

## 2020-09-04 DIAGNOSIS — B353 Tinea pedis: Secondary | ICD-10-CM

## 2020-09-04 DIAGNOSIS — M545 Low back pain, unspecified: Secondary | ICD-10-CM

## 2020-09-04 MED ORDER — CYCLOBENZAPRINE HCL 10 MG PO TABS: 5.0000 mg | ORAL_TABLET | Freq: Three times a day (TID) | ORAL | 0 refills | Status: DC | PRN

## 2020-09-04 MED ORDER — KETOCONAZOLE 2 % EX CREA: 1.0000 "application " | TOPICAL_CREAM | Freq: Every day | CUTANEOUS | 0 refills | Status: AC

## 2020-09-04 MED FILL — CYCLOBENZAPRINE HCL 10 MG T: 10 | 7 days supply | Qty: 21 | Fill #0

## 2020-09-04 MED FILL — LISINOPRIL 10 MG TABS: 10 | 90 days supply | Qty: 90 | Fill #1

## 2020-09-04 MED FILL — KETOCONAZOLE 2 % CREA: 2 | 30 days supply | Qty: 60 | Fill #0

## 2020-09-04 MED FILL — glipiZIDE 10 MG TABS: 10 | 90 days supply | Qty: 180 | Fill #1

## 2020-09-04 NOTE — Telephone Encounter (Signed)
Will refill maintenance, but please advise on foot cream and flexeril

## 2020-09-04 NOTE — Telephone Encounter (Signed)
Pt called stating he needs refills on metformin, glipizide, lisinopril, meloxicam, a foot fungal cream Dr. Carmelia Roller had previously prescribed for him in the past (?) & on flexiril he stated Dr. Carmelia Roller had prescribed for him in the past (?).  Pharmacy is [facility].

## 2020-09-10 ENCOUNTER — Ambulatory Visit: Payer: 59 | Attending: Internal Medicine

## 2020-09-10 ENCOUNTER — Other Ambulatory Visit (HOSPITAL_BASED_OUTPATIENT_CLINIC_OR_DEPARTMENT_OTHER): Payer: Self-pay | Admitting: Internal Medicine

## 2020-09-10 DIAGNOSIS — Z23 Encounter for immunization: Secondary | ICD-10-CM

## 2020-09-10 NOTE — Progress Notes (Signed)
   Covid-19 Vaccination Clinic  Name:  Jamie Foster    MRN: [id number] DOB: [birth year]  09/10/2020  Mr. Jamie Foster was observed post Covid-19 immunization for 15 minutes without incident. He was provided with Vaccine Information Sheet and instruction to access the V-Safe system. Vaccinated by Theodis Sato.  Mr. Jamie Foster was instructed to call 911 with any severe reactions post vaccine: Marland Kitchen Difficulty breathing  . Swelling of face and throat  . A fast heartbeat  . A bad rash all over body  . Dizziness and weakness

## 2020-09-11 DIAGNOSIS — H5203 Hypermetropia, bilateral: Secondary | ICD-10-CM | POA: Diagnosis not present

## 2020-09-11 DIAGNOSIS — E11311 Type 2 diabetes mellitus with unspecified diabetic retinopathy with macular edema: Secondary | ICD-10-CM | POA: Diagnosis not present

## 2020-09-11 DIAGNOSIS — H524 Presbyopia: Secondary | ICD-10-CM | POA: Diagnosis not present

## 2020-09-18 MED FILL — PFIZER-BIONTECH COVID-19 VA: 30 | 1 days supply | Qty: 0 | Fill #0

## 2020-09-19 ENCOUNTER — Telehealth: Payer: Self-pay

## 2020-09-19 ENCOUNTER — Encounter: Payer: Self-pay | Admitting: Family Medicine

## 2020-09-19 ENCOUNTER — Telehealth: Payer: Self-pay | Admitting: Family Medicine

## 2020-09-19 ENCOUNTER — Ambulatory Visit: Payer: 59 | Admitting: Family Medicine

## 2020-09-19 NOTE — Telephone Encounter (Signed)
Pt had an appt at 2:15 09-19-2020 with Dr Carmelia Roller pt arrived 2:34 and was informed by coworker Dahlia Client) pt was late and had to reschedule, pt kept insisting and stating that he was told that his appt was at 2:30, when pt was clearly informed his appt was at 2:15, pt was explained again that we were sorry and that he had to reschedule his appt, pt insisted and would not let us speak only he would explain his version and was being very rude in front of other patient stating it was our fault, kindly pt was offered to rescheduled but did not want to reschedule nor leave the office.  Pt was very angry and very rude leaving the office, pt decides to return to the office a few minutes after asking very mad for Hannah's name and demanding and disrespectful interrupted while helping a pt. Pt was directed to supervisor.

## 2020-09-19 NOTE — Telephone Encounter (Signed)
Patient came back into the office after arriving late for his 2:15 pm appointment today and interrupted Annice Pih while she was assisting another patient and Annice Pih let him know Dahlia Client would have to help him.  I approached the desk along with Dahlia Client and patient stated he wanted to see a Production designer, theatre/television/film.  I let the patient know I was the clinic office supervisor and he asked to speak to me in my office.  I proceeded to escort the patient to the nurse visit room.  Upon hearing the patient get loud and after he made a motion of throwing his hands at me, I left the door cracked.  Patient continued to to say he arrived here at 2:28 pm for a 2:15 pm appointment.  The front staff verified it was after 2:30 pm when patient presented for the 2:15 pm appointment.  He said he was disrespected by the front desk.  He told me I would not look him in the eye and I clearly engage in conversation with him and let him know the staff was advising him, as they should he was late for his appointment.  I reiterated his appointment was at 2:15 pm, not at 2:30 pm.  Patient continued to be argumentative.  Weldon Picking, Nurse Supervisor, heard her from her office and came down the hall to see if she could be of assistance.  Patient kept saying he wanted to lodge complaints against staff at the front desk and wanted names to report.  He then proceeded to leave the office.

## 2020-09-19 NOTE — Telephone Encounter (Signed)
As noted, I wish to d/c pt due to abuse behavior toward staff. Ty.

## 2020-09-19 NOTE — Telephone Encounter (Signed)
Pt had appt at 215, came in late at 73. Pt was told that he needed to reschedule the appt.  Tiffany spoke with him this afternoon around lunch to reschedule the first appt he missed in the morning at 745. He was told that his new appt was at 215. Pt was not happy with having to reschedule appt. He said he was told it was at 230, I then told him on our end it was 5 & was told I was wrong(our system was wrong)  Pt then got angry and walked out & I couldn't reschedule.  He came back in a few minutes later asking to speak to the office manager.

## 2020-09-19 NOTE — Telephone Encounter (Signed)
Please proceed to discharge the patient due to abusive behavior to staff. Thank you.

## 2020-09-24 ENCOUNTER — Encounter: Payer: Self-pay | Admitting: Family Medicine

## 2020-09-24 NOTE — Telephone Encounter (Signed)
Done

## 2020-09-27 DIAGNOSIS — E113412 Type 2 diabetes mellitus with severe nonproliferative diabetic retinopathy with macular edema, left eye: Secondary | ICD-10-CM | POA: Diagnosis not present

## 2020-09-27 DIAGNOSIS — E113511 Type 2 diabetes mellitus with proliferative diabetic retinopathy with macular edema, right eye: Secondary | ICD-10-CM | POA: Diagnosis not present

## 2020-10-27 ENCOUNTER — Other Ambulatory Visit (HOSPITAL_BASED_OUTPATIENT_CLINIC_OR_DEPARTMENT_OTHER): Payer: Self-pay

## 2020-10-30 DIAGNOSIS — H43811 Vitreous degeneration, right eye: Secondary | ICD-10-CM | POA: Diagnosis not present

## 2020-10-30 DIAGNOSIS — E113493 Type 2 diabetes mellitus with severe nonproliferative diabetic retinopathy without macular edema, bilateral: Secondary | ICD-10-CM | POA: Diagnosis not present

## 2020-10-30 DIAGNOSIS — H3582 Retinal ischemia: Secondary | ICD-10-CM | POA: Diagnosis not present

## 2020-12-21 ENCOUNTER — Other Ambulatory Visit (HOSPITAL_BASED_OUTPATIENT_CLINIC_OR_DEPARTMENT_OTHER): Payer: Self-pay

## 2020-12-21 MED FILL — FREESTYLE LIBRE 2 SENSOR MI: 28 days supply | Qty: 2 | Fill #0

## 2020-12-28 MED FILL — KETOCONAZOLE 2% CREAM: 2 | 90 days supply | Qty: 90 | Fill #0

## 2020-12-28 MED FILL — ATORVASTATIN CALCIUM 10 MG: 10 | 90 days supply | Qty: 90 | Fill #0

## 2020-12-28 MED FILL — NEO/POLYMYXIN/HC EAR SUSP: 3.5-10000-1 | 17 days supply | Qty: 10 | Fill #0

## 2020-12-28 MED FILL — PREDNISOLONE AC 1% EYE DROP: 1 | 90 days supply | Qty: 10 | Fill #0

## 2020-12-28 MED FILL — metFORMIN HCL ER 500 MG TB2: 500 | 90 days supply | Qty: 180 | Fill #0

## 2020-12-28 MED FILL — glipiZIDE 10 MG TABS: 10 | 90 days supply | Qty: 90 | Fill #0

## 2020-12-28 MED FILL — LISINOPRIL 10 MG TABS: 10 | 90 days supply | Qty: 90 | Fill #0

## 2020-12-28 MED FILL — FARXIGA 10 MG TABLET: 10 | 90 days supply | Qty: 90 | Fill #0

## 2020-12-28 MED FILL — OZEMPIC (1 MG/DOSE) 4 MG/3M: 4 | 90 days supply | Qty: 9 | Fill #0

## 2021-02-07 ENCOUNTER — Other Ambulatory Visit: Payer: Self-pay | Admitting: Family Medicine

## 2021-02-07 DIAGNOSIS — M545 Low back pain, unspecified: Secondary | ICD-10-CM

## 2021-02-08 ENCOUNTER — Other Ambulatory Visit (HOSPITAL_BASED_OUTPATIENT_CLINIC_OR_DEPARTMENT_OTHER): Payer: Self-pay

## 2021-02-11 ENCOUNTER — Other Ambulatory Visit (HOSPITAL_BASED_OUTPATIENT_CLINIC_OR_DEPARTMENT_OTHER): Payer: Self-pay

## 2021-02-11 MED FILL — MELOXICAM 15 MG TABLET: 15 | 90 days supply | Qty: 90 | Fill #0

## 2021-02-23 ENCOUNTER — Other Ambulatory Visit (HOSPITAL_BASED_OUTPATIENT_CLINIC_OR_DEPARTMENT_OTHER): Payer: Self-pay

## 2021-03-08 ENCOUNTER — Other Ambulatory Visit (HOSPITAL_BASED_OUTPATIENT_CLINIC_OR_DEPARTMENT_OTHER): Payer: Self-pay

## 2021-03-08 MED FILL — Continuous Glucose System Sensor: 14 days supply | Qty: 1 | Fill #0 | Status: AC

## 2021-03-08 MED FILL — Insulin Glargine Soln Pen-Injector 100 Unit/ML: SUBCUTANEOUS | 84 days supply | Qty: 15 | Fill #0 | Status: CN

## 2021-03-08 MED FILL — Continuous Glucose System Receiver: 90 days supply | Qty: 1 | Fill #0 | Status: AC

## 2021-05-02 ENCOUNTER — Other Ambulatory Visit: Payer: Self-pay | Admitting: Family Medicine

## 2021-05-02 ENCOUNTER — Ambulatory Visit: Payer: 59 | Attending: Internal Medicine

## 2021-05-02 ENCOUNTER — Other Ambulatory Visit (HOSPITAL_BASED_OUTPATIENT_CLINIC_OR_DEPARTMENT_OTHER): Payer: Self-pay

## 2021-05-02 ENCOUNTER — Other Ambulatory Visit (HOSPITAL_COMMUNITY): Payer: Self-pay

## 2021-05-02 DIAGNOSIS — M545 Low back pain, unspecified: Secondary | ICD-10-CM

## 2021-05-02 DIAGNOSIS — Z23 Encounter for immunization: Secondary | ICD-10-CM

## 2021-05-02 MED ORDER — COVID-19 MRNA VAC-TRIS(PFIZER) 30 MCG/0.3ML IM SUSP
INTRAMUSCULAR | 0 refills | Status: DC
  Filled 2021-05-02: qty 0.3, 1d supply, fill #0

## 2021-05-02 MED ORDER — TRIAMCINOLONE ACETONIDE 55 MCG/ACT NA AERO
INHALATION_SPRAY | NASAL | 12 refills | Status: DC
  Filled 2021-05-02: qty 33.8, 60d supply, fill #0

## 2021-05-02 MED ORDER — CYCLOBENZAPRINE HCL 10 MG PO TABS
ORAL_TABLET | ORAL | 0 refills | Status: DC
  Filled 2021-05-02: qty 21, fill #0

## 2021-05-02 MED FILL — Metformin HCl Tab ER 24HR 500 MG: ORAL | 90 days supply | Qty: 180 | Fill #0 | Status: AC

## 2021-05-02 MED FILL — Continuous Glucose System Sensor: 14 days supply | Qty: 1 | Fill #1 | Status: AC

## 2021-05-02 MED FILL — Glipizide Tab 10 MG: ORAL | 90 days supply | Qty: 90 | Fill #0 | Status: AC

## 2021-05-02 MED FILL — Lisinopril Tab 10 MG: ORAL | 90 days supply | Qty: 90 | Fill #0 | Status: AC

## 2021-05-02 NOTE — Progress Notes (Signed)
   Covid-19 Vaccination Clinic  Name:  Jamie Foster    MRN: [id number] DOB: 06-Mar-1966  05/02/2021  [name] was observed post Covid-19 immunization for 15 minutes without incident. He was provided with Vaccine Information Sheet and instruction to access the V-Safe system.   [name] was instructed to call 911 with any severe reactions post vaccine: Difficulty breathing  Swelling of face and throat  A fast heartbeat  A bad rash all over body  Dizziness and weakness   Immunizations Administered     Name Date Dose VIS Date Route   PFIZER Comrnaty(Gray TOP) Covid-19 Vaccine 05/02/2021  2:44 PM 0.3 mL 11/01/2020 Intramuscular   Manufacturer: [employer], [employer]   Lot: [id number]   NDC: [phone]

## 2021-05-02 NOTE — Telephone Encounter (Signed)
Rx accidentally sent- spoke w/ Michele Mcalpine downstairs- cancelled script.

## 2021-05-06 ENCOUNTER — Other Ambulatory Visit (HOSPITAL_BASED_OUTPATIENT_CLINIC_OR_DEPARTMENT_OTHER): Payer: Self-pay

## 2021-05-20 ENCOUNTER — Ambulatory Visit (INDEPENDENT_AMBULATORY_CARE_PROVIDER_SITE_OTHER): Payer: 59 | Admitting: Otolaryngology

## 2021-05-20 ENCOUNTER — Other Ambulatory Visit: Payer: Self-pay

## 2021-05-20 DIAGNOSIS — R490 Dysphonia: Secondary | ICD-10-CM | POA: Diagnosis not present

## 2021-05-20 DIAGNOSIS — K219 Gastro-esophageal reflux disease without esophagitis: Secondary | ICD-10-CM | POA: Diagnosis not present

## 2021-05-20 DIAGNOSIS — J310 Chronic rhinitis: Secondary | ICD-10-CM | POA: Diagnosis not present

## 2021-05-20 NOTE — Progress Notes (Signed)
HPI: Jamie Foster is a 55 y.o. male who presents for evaluation of chronic throat complaints.  He states that his throat always is dry.  He occasionally gets swelling of his uvula and has hoarseness.  He went back to [place] for a while and did much better while in [place].  But since coming back to the [place] he complains of dry throat and an intermittent hoarseness.  He had taken reflux medication that seemed to help for a while.  He does not complain of sore throat or difficulty swallowing and his voice is reasonably good today.  But he complains of dry throat at times and postnasal drainage.  Past Medical History:  Diagnosis Date   Allergy    Diabetes mellitus without complication ([place])    H pylori ulcer    Hyperlipidemia    Hypertension    Kidney stone    Sleep apnea    Past Surgical History:  Procedure Laterality Date   NASAL SINUS SURGERY     Social History   Socioeconomic History   Marital status: Married    Spouse name: Not on file   Number of children: Not on file   Years of education: Not on file   Highest education level: Not on file  Occupational History   Not on file  Tobacco Use   Smoking status: Never   Smokeless tobacco: Never  Vaping Use   Vaping Use: Never used  Substance and Sexual Activity   Alcohol use: No   Drug use: No   Sexual activity: Yes  Other Topics Concern   Not on file  Social History Narrative   Married.Education: College/Other. Exercise: Walks.   Social Determinants of Health   Financial Resource Strain: Not on file  Food Insecurity: Not on file  Transportation Needs: Not on file  Physical Activity: Not on file  Stress: Not on file  Social Connections: Not on file   Family History  Problem Relation Age of Onset   Hypertension Mother    Prostate cancer Father    Colon cancer Neg Hx    Colon polyps Neg Hx    No Known Allergies Prior to Admission medications   Medication Sig Start Date End Date Taking? Authorizing Provider   atorvastatin (LIPITOR) 10 MG tablet TAKE 1 TABLET BY MOUTH EVERY MORNING 12/21/20 12/21/21    atorvastatin (LIPITOR) 40 MG tablet Take 1 tablet (40 mg total) by mouth daily. 05/11/20   Shelda Pal, DO  Continuous Blood Gluc Receiver (DEXCOM G6 RECEIVER) DEVI 1 Device by Does not apply route as directed. 06/11/20   Shelda Pal, DO  Continuous Blood Gluc Receiver (FREESTYLE LIBRE 2 READER) DEVI USE AS DIRECTED EVERY 3 - 4 HOURS WHILE AWAKE 02/08/21 02/08/22    Continuous Blood Gluc Sensor (DEXCOM G6 SENSOR) MISC 1 Device by Does not apply route as directed. 06/11/20   Shelda Pal, DO  Continuous Blood Gluc Sensor (FREESTYLE LIBRE 2 SENSOR) MISC USE AS DIRECTED EVERY 2 WEEKS 02/08/21 02/08/22    Continuous Blood Gluc Sensor (FREESTYLE LIBRE 2 SENSOR) MISC USE AS DIRECTED TO CHECK BLOOD SUGAR AND CHANGE EVERY 2 WEEKS 12/21/20 12/21/21    Continuous Blood Gluc Sensor (FREESTYLE LIBRE 2 SENSOR) MISC USE 1 KIT EVERY TWO WEEKS 10/27/20 10/27/21    Continuous Blood Gluc Transmit (DEXCOM G6 TRANSMITTER) MISC 1 Device by Does not apply route as directed. 06/11/20   Shelda Pal, DO  COVID-19 mRNA Vac-TriS, Pfizer, SUSP injection Inject into the muscle.  05/02/21   Carlyle Basques, MD  COVID-19 mRNA vaccine, Pfizer, 30 MCG/0.3ML injection INJECT AS DIRECTED 09/10/20 09/10/21  Carlyle Basques, MD  dapagliflozin propanediol (FARXIGA) 10 MG TABS tablet TAKE 1 TABLET BY MOUTH EVERY MORNING 12/21/20 12/21/21    glipiZIDE (GLUCOTROL) 10 MG tablet TAKE 1 TABLET BY MOUTH EVERY MORNING 12/21/20 12/21/21    glipiZIDE (GLUCOTROL) 10 MG tablet TAKE 1 TABLET (10 MG TOTAL) BY MOUTH 2 (TWO) TIMES DAILY BEFORE A MEAL. 06/11/20 06/11/21  Shelda Pal, DO  glucose blood (TRUETRACK TEST) test strip Use 2x a day 05/17/18   Wendie Agreste, MD  Insulin Glargine (BASAGLAR KWIKPEN) 100 UNIT/ML INJECT 0.18 MLS (18 UNITS TOTAL) INTO THE SKIN DAILY. 06/11/20 06/11/21  Shelda Pal, DO   insulin glargine (LANTUS SOLOSTAR) 100 UNIT/ML Solostar Pen Inject 18 Units into the skin at bedtime. 08/20/20   Shelda Pal, DO  Insulin Pen Needle 32G X 4 MM MISC Use 1x a day 01/20/19   Philemon Kingdom, MD  ketoconazole (NIZORAL) 2 % cream APPLY TO THE AFFECTED AREA(S) ON SKIN ONCE DAILY 09/04/20   Nani Ravens, Crosby Oyster, DO  ketoconazole (NIZORAL) 2 % cream APPLY LIBERALLY TO AFFECTED AREA ONCE A DAY 12/21/20 12/21/21    lisinopril (ZESTRIL) 10 MG tablet TAKE 1 TABLET BY MOUTH EVERY MORNING 12/21/20 12/21/21    lisinopril (ZESTRIL) 10 MG tablet TAKE 1 TABLET (10 MG TOTAL) BY MOUTH DAILY. 05/11/20 05/11/21  Shelda Pal, DO  meloxicam (MOBIC) 15 MG tablet Take 1 tablet (15 mg total) by mouth daily. 01/21/19   Horald Pollen, MD  meloxicam (MOBIC) 15 MG tablet TAKE 1 TABLET BY MOUTH ONCE DAILY 02/11/21 02/11/22    meloxicam (MOBIC) 15 MG tablet TAKE 1 TABLET BY MOUTH TWICE DAILY 12/21/20 12/21/21    metFORMIN (GLUCOPHAGE-XR) 500 MG 24 hr tablet TAKE 2 TABLETS BY MOUTH EVERY MORNING 12/21/20 12/21/21    metFORMIN (GLUCOPHAGE-XR) 500 MG 24 hr tablet TAKE 2 TABLETS BY MOUTH TWO TIMES DAILY 05/11/20 05/11/21  Shelda Pal, DO  neomycin-polymyxin-hydrocortisone (CORTISPORIN) 3.5-10000-1 OTIC suspension PLACE 3 DROPS INTO LEFT EAR 4 TIMES A DAY 12/21/20 05/15/62    ONETOUCH DELICA LANCETS 33L MISC Use to test blood sugar 2 times daily as instructed. 01/24/15   Philemon Kingdom, MD  polyethylene glycol (MIRALAX / GLYCOLAX) packet Take 17 g by mouth daily. 10/30/18   Scot Jun, FNP  prednisoLONE acetate (PRED FORTE) 1 % ophthalmic suspension PLACE 1 DROP INTO THE RIGHT EYE TWICE A DAY 12/21/20 12/21/21    Semaglutide, 1 MG/DOSE, 4 MG/3ML SOPN INJECT 1 MG UNDER THE SKIN ONCE A WEEK 12/21/20 12/21/21    triamcinolone (NASACORT) 55 MCG/ACT AERO nasal inhaler Place 2 squirts in each nostril at night before bedtime 06/11/20     [place] LANCETS MISC Use 2x a day - Tru Track  06/09/14   Philemon Kingdom, MD     Positive ROS: Otherwise negative  All other systems have been reviewed and were otherwise negative with the exception of those mentioned in the HPI and as above.  Physical Exam: Constitutional: Alert, well-appearing, no acute distress Ears: External ears without lesions or tenderness. Ear canals are clear bilaterally with intact, clear TMs.  Nasal: External nose without lesions. Septum is moderately deviated to the left with mild rhinitis.  After decongesting the nose nasal passages were clear bilaterally.  On nasal endoscopy both middle meatus regions were clear.  The nasopharynx was clear.  .  Oral: Lips and gums without  lesions. Tongue and palate mucosa without lesions. Posterior oropharynx clear. Fiberoptic laryngoscopy was passed through the right nostril without difficulty.  The nasopharynx was clear.  The base of tongue vallecula epiglottis were normal.  The vocal cords were clear bilaterally with normal vocal mobility.  He had mild edema of the arytenoid mucosa with some posterior vocal cord mucus but no mucosal abnormalities noted.  Both piriform sinuses were clear. Neck: No palpable adenopathy or masses Respiratory: Breathing comfortably  Skin: No facial/neck lesions or rash noted.  Laryngoscopy  Date/Time: 05/20/2021 5:51 PM Performed by: Rozetta Nunnery, MD Authorized by: Rozetta Nunnery, MD   Consent:    Consent obtained:  Verbal   Consent given by:  Patient Procedure details:    Indications: direct visualization of the upper aerodigestive tract and hoarseness, dysphagia, or aspiration     Medication:  Afrin   Instrument: flexible fiberoptic laryngoscope     Scope location: right nare   Sinus:    Right middle meatus: normal     Left middle meatus: normal     Right nasopharynx: normal     Left nasopharynx: normal   Mouth:    Oropharynx: normal     Vallecula: normal     Base of tongue: normal     Epiglottis: normal    Throat:    Pyriform sinus: normal     True vocal cords: normal   Comments:     On fiberoptic laryngoscopy the vocal cords were clear bilaterally with no vocal cord lesions noted.  He had mild edema of the arytenoid mucosa with minimal supraglottic mucus.  Both piriform sinuses were clear.  Assessment: Complains of chronic dry throat with moderate rhinitis. Hoarseness probably related to GE reflux disease as vocal cords were clear on fiberoptic laryngoscopy.  Plan: Recommended regular use of nasal steroid spray Flonase 2 sprays each nostril at night to help with his nasal airway.  Recommended drinking plenty of liquids. Reassured him of normal vocal cord examination and upper airway examination with no evidence of neoplasm or infection. I suspect some of her symptoms may be related to GE reflux disease and prescribed omeprazole 40 mg daily before dinner. He will follow-up as needed.  Radene Journey, MD

## 2021-05-31 ENCOUNTER — Other Ambulatory Visit (HOSPITAL_BASED_OUTPATIENT_CLINIC_OR_DEPARTMENT_OTHER): Payer: Self-pay

## 2021-05-31 MED FILL — Neomycin-Polymyxin-HC Otic Susp 3.5 MG/ML-10000 Unit/ML-1%: OTIC | 17 days supply | Qty: 10 | Fill #0 | Status: AC

## 2021-05-31 MED FILL — Ketoconazole Cream 2%: CUTANEOUS | 30 days supply | Qty: 90 | Fill #0 | Status: AC

## 2021-06-03 ENCOUNTER — Other Ambulatory Visit (HOSPITAL_BASED_OUTPATIENT_CLINIC_OR_DEPARTMENT_OTHER): Payer: Self-pay

## 2021-06-03 MED FILL — Continuous Glucose System Sensor: 28 days supply | Qty: 2 | Fill #2 | Status: AC

## 2021-06-05 ENCOUNTER — Other Ambulatory Visit (HOSPITAL_BASED_OUTPATIENT_CLINIC_OR_DEPARTMENT_OTHER): Payer: Self-pay

## 2021-06-05 MED ORDER — OMEPRAZOLE 40 MG PO CPDR
DELAYED_RELEASE_CAPSULE | ORAL | 4 refills | Status: DC
  Filled 2021-06-05: qty 30, 30d supply, fill #0

## 2021-07-26 ENCOUNTER — Other Ambulatory Visit (INDEPENDENT_AMBULATORY_CARE_PROVIDER_SITE_OTHER): Payer: Self-pay | Admitting: Otolaryngology

## 2021-07-26 ENCOUNTER — Other Ambulatory Visit (HOSPITAL_BASED_OUTPATIENT_CLINIC_OR_DEPARTMENT_OTHER): Payer: Self-pay

## 2021-07-26 ENCOUNTER — Other Ambulatory Visit: Payer: Self-pay | Admitting: Family Medicine

## 2021-07-26 DIAGNOSIS — E11319 Type 2 diabetes mellitus with unspecified diabetic retinopathy without macular edema: Secondary | ICD-10-CM

## 2021-07-26 DIAGNOSIS — IMO0002 Reserved for concepts with insufficient information to code with codable children: Secondary | ICD-10-CM

## 2021-07-26 MED ORDER — INSULIN GLARGINE 100 UNIT/ML SOLOSTAR PEN
PEN_INJECTOR | SUBCUTANEOUS | 11 refills | Status: DC
  Filled 2021-07-26 (×3): qty 15, 84d supply, fill #0

## 2021-07-26 MED FILL — Continuous Glucose System Sensor: 28 days supply | Qty: 2 | Fill #3 | Status: CN

## 2021-08-01 ENCOUNTER — Other Ambulatory Visit (HOSPITAL_BASED_OUTPATIENT_CLINIC_OR_DEPARTMENT_OTHER): Payer: Self-pay

## 2021-08-02 ENCOUNTER — Other Ambulatory Visit (HOSPITAL_BASED_OUTPATIENT_CLINIC_OR_DEPARTMENT_OTHER): Payer: Self-pay

## 2021-08-05 ENCOUNTER — Other Ambulatory Visit (HOSPITAL_BASED_OUTPATIENT_CLINIC_OR_DEPARTMENT_OTHER): Payer: Self-pay

## 2021-08-06 ENCOUNTER — Other Ambulatory Visit (HOSPITAL_BASED_OUTPATIENT_CLINIC_OR_DEPARTMENT_OTHER): Payer: Self-pay

## 2021-08-06 MED ORDER — METFORMIN HCL ER 500 MG PO TB24
ORAL_TABLET | Freq: Every morning | ORAL | 1 refills | Status: DC
  Filled 2021-08-06: qty 180, 90d supply, fill #0
  Filled 2021-10-09: qty 180, 90d supply, fill #1
  Filled 2021-10-09: qty 30, 15d supply, fill #1
  Filled 2021-10-23: qty 60, 30d supply, fill #2

## 2021-08-06 MED ORDER — GLIPIZIDE 10 MG PO TABS
ORAL_TABLET | Freq: Every morning | ORAL | 1 refills | Status: DC
  Filled 2021-08-06: qty 90, 90d supply, fill #0

## 2021-08-06 MED ORDER — NEOMYCIN-POLYMYXIN-HC 3.5-10000-1 OT SUSP
OTIC | 1 refills | Status: AC
  Filled 2021-08-06 – 2021-08-13 (×2): qty 10, 17d supply, fill #0
  Filled 2022-04-04: qty 10, 17d supply, fill #1

## 2021-08-13 ENCOUNTER — Other Ambulatory Visit (HOSPITAL_BASED_OUTPATIENT_CLINIC_OR_DEPARTMENT_OTHER): Payer: Self-pay

## 2021-08-14 ENCOUNTER — Other Ambulatory Visit (HOSPITAL_BASED_OUTPATIENT_CLINIC_OR_DEPARTMENT_OTHER): Payer: Self-pay

## 2021-08-16 ENCOUNTER — Other Ambulatory Visit (HOSPITAL_BASED_OUTPATIENT_CLINIC_OR_DEPARTMENT_OTHER): Payer: Self-pay

## 2021-08-21 ENCOUNTER — Other Ambulatory Visit (HOSPITAL_BASED_OUTPATIENT_CLINIC_OR_DEPARTMENT_OTHER): Payer: Self-pay

## 2021-09-18 ENCOUNTER — Other Ambulatory Visit (HOSPITAL_BASED_OUTPATIENT_CLINIC_OR_DEPARTMENT_OTHER): Payer: Self-pay

## 2021-10-09 ENCOUNTER — Ambulatory Visit: Payer: 59 | Attending: Internal Medicine

## 2021-10-09 ENCOUNTER — Other Ambulatory Visit (HOSPITAL_BASED_OUTPATIENT_CLINIC_OR_DEPARTMENT_OTHER): Payer: Self-pay

## 2021-10-09 DIAGNOSIS — Z23 Encounter for immunization: Secondary | ICD-10-CM

## 2021-10-09 MED FILL — Meloxicam Tab 15 MG: ORAL | 90 days supply | Qty: 90 | Fill #0 | Status: AC

## 2021-10-09 NOTE — Progress Notes (Signed)
   Covid-19 Vaccination Clinic  Name:  Jamie Foster    MRN: [id number] DOB: 1966-02-10  10/09/2021  [name] was observed post Covid-19 immunization for 15 minutes without incident. He was provided with Vaccine Information Sheet and instruction to access the V-Safe system.   [name] was instructed to call 911 with any severe reactions post vaccine: Difficulty breathing  Swelling of face and throat  A fast heartbeat  A bad rash all over body  Dizziness and weakness   Immunizations Administered     Name Date Dose VIS Date Route   Pfizer Covid-19 Vaccine Bivalent Booster 10/09/2021  9:31 AM 0.3 mL 07/24/2021 Intramuscular   Manufacturer: [employer], [employer]   Lot: [id number]   NDC: [phone]

## 2021-10-23 ENCOUNTER — Other Ambulatory Visit (HOSPITAL_BASED_OUTPATIENT_CLINIC_OR_DEPARTMENT_OTHER): Payer: Self-pay

## 2021-10-23 MED ORDER — IBUPROFEN 800 MG PO TABS
ORAL_TABLET | ORAL | 0 refills | Status: DC
  Filled 2021-10-23: qty 16, 5d supply, fill #0

## 2021-10-25 ENCOUNTER — Other Ambulatory Visit (HOSPITAL_BASED_OUTPATIENT_CLINIC_OR_DEPARTMENT_OTHER): Payer: Self-pay

## 2021-10-25 MED ORDER — PFIZER COVID-19 VAC BIVALENT 30 MCG/0.3ML IM SUSP
INTRAMUSCULAR | 0 refills | Status: DC
  Filled 2021-10-25: qty 0.3, 1d supply, fill #0

## 2021-11-13 ENCOUNTER — Other Ambulatory Visit (HOSPITAL_BASED_OUTPATIENT_CLINIC_OR_DEPARTMENT_OTHER): Payer: Self-pay

## 2021-11-13 MED ORDER — AMOXICILLIN 500 MG PO CAPS
500.0000 mg | ORAL_CAPSULE | Freq: Three times a day (TID) | ORAL | 0 refills | Status: DC
  Filled 2021-11-13: qty 21, 7d supply, fill #0

## 2021-11-13 MED ORDER — IBUPROFEN 800 MG PO TABS
800.0000 mg | ORAL_TABLET | Freq: Three times a day (TID) | ORAL | 0 refills | Status: DC | PRN
Start: 2021-11-13 — End: 2023-01-22
  Filled 2021-11-13: qty 16, 6d supply, fill #0

## 2021-11-13 MED FILL — Continuous Glucose System Sensor: 28 days supply | Qty: 2 | Fill #0 | Status: CN

## 2021-11-19 ENCOUNTER — Other Ambulatory Visit (HOSPITAL_BASED_OUTPATIENT_CLINIC_OR_DEPARTMENT_OTHER): Payer: Self-pay

## 2021-11-19 MED ORDER — PREDNISOLONE ACETATE 1 % OP SUSP
OPHTHALMIC | 1 refills | Status: DC
  Filled 2021-11-19: qty 5, 50d supply, fill #0

## 2021-11-19 MED ORDER — ATORVASTATIN CALCIUM 10 MG PO TABS
10.0000 mg | ORAL_TABLET | Freq: Every morning | ORAL | 1 refills | Status: DC
  Filled 2021-11-19: qty 90, 90d supply, fill #0
  Filled 2022-04-04: qty 90, 90d supply, fill #1

## 2021-11-19 MED ORDER — LISINOPRIL 10 MG PO TABS
ORAL_TABLET | ORAL | 1 refills | Status: DC
  Filled 2021-11-19: qty 90, 90d supply, fill #0
  Filled 2022-04-04: qty 90, 90d supply, fill #1

## 2021-11-19 MED ORDER — KETOCONAZOLE 2 % EX CREA
TOPICAL_CREAM | CUTANEOUS | 1 refills | Status: DC
  Filled 2022-06-26: qty 90, 90d supply, fill #0
  Filled 2022-08-26 – 2022-10-22 (×2): qty 90, 90d supply, fill #1

## 2021-11-19 MED ORDER — NEOMYCIN-POLYMYXIN-HC 3.5-10000-1 OT SUSP
OTIC | 1 refills | Status: DC
  Filled 2021-11-19: qty 10, 30d supply, fill #0
  Filled 2022-06-27: qty 10, 30d supply, fill #1

## 2021-11-19 MED ORDER — GLIPIZIDE 10 MG PO TABS
10.0000 mg | ORAL_TABLET | Freq: Every morning | ORAL | 1 refills | Status: DC
  Filled 2021-11-19: qty 90, 90d supply, fill #0
  Filled 2022-08-26: qty 90, 90d supply, fill #1

## 2021-11-19 MED ORDER — FREESTYLE LIBRE 2 READER DEVI
0 refills | Status: DC
  Filled 2021-11-19 – 2022-09-16 (×2): qty 1, 90d supply, fill #0

## 2021-11-19 MED ORDER — FARXIGA 10 MG PO TABS
10.0000 mg | ORAL_TABLET | Freq: Every morning | ORAL | 1 refills | Status: DC
  Filled 2021-11-19: qty 90, 90d supply, fill #0
  Filled 2022-02-28: qty 90, 90d supply, fill #1

## 2021-11-19 MED ORDER — METFORMIN HCL ER 500 MG PO TB24
ORAL_TABLET | ORAL | 1 refills | Status: DC
  Filled 2021-11-19: qty 180, 90d supply, fill #0
  Filled 2022-06-26 – 2022-06-27 (×2): qty 180, 90d supply, fill #1

## 2021-11-19 MED ORDER — MELOXICAM 15 MG PO TABS: 15.0000 mg | ORAL_TABLET | Freq: Every day | ORAL | 1 refills | Status: DC

## 2021-11-19 MED ORDER — KETOCONAZOLE 2 % EX CREA
TOPICAL_CREAM | CUTANEOUS | 1 refills | Status: DC
  Filled 2021-11-19: qty 90, 90d supply, fill #0
  Filled 2022-04-04: qty 90, 90d supply, fill #1

## 2021-11-19 MED ORDER — OZEMPIC (1 MG/DOSE) 4 MG/3ML ~~LOC~~ SOPN
PEN_INJECTOR | SUBCUTANEOUS | 1 refills | Status: DC
  Filled 2021-11-19: qty 9, 90d supply, fill #0
  Filled 2022-06-26: qty 9, 84d supply, fill #0
  Filled 2022-10-22: qty 9, 84d supply, fill #1

## 2021-11-19 MED FILL — Semaglutide Soln Pen-inj 1 MG/DOSE (4 MG/3ML): SUBCUTANEOUS | 84 days supply | Qty: 9 | Fill #0 | Status: AC

## 2021-11-20 ENCOUNTER — Other Ambulatory Visit (HOSPITAL_BASED_OUTPATIENT_CLINIC_OR_DEPARTMENT_OTHER): Payer: Self-pay

## 2022-01-07 ENCOUNTER — Other Ambulatory Visit (HOSPITAL_BASED_OUTPATIENT_CLINIC_OR_DEPARTMENT_OTHER): Payer: Self-pay

## 2022-01-07 MED ORDER — METFORMIN HCL ER 500 MG PO TB24
ORAL_TABLET | ORAL | 1 refills | Status: DC
  Filled 2022-01-07 – 2022-01-08 (×2): qty 360, 90d supply, fill #0
  Filled 2022-04-04: qty 360, 90d supply, fill #1

## 2022-01-07 MED ORDER — CYCLOBENZAPRINE HCL 10 MG PO TABS
ORAL_TABLET | ORAL | 1 refills | Status: DC
  Filled 2022-01-07: qty 45, 30d supply, fill #0
  Filled 2022-10-22: qty 45, 30d supply, fill #1

## 2022-01-07 MED ORDER — FREESTYLE LIBRE 2 SENSOR MISC
1 refills | Status: DC
  Filled 2022-01-07: qty 2, 28d supply, fill #0
  Filled 2022-02-28: qty 2, 28d supply, fill #1
  Filled 2022-04-04: qty 4, 56d supply, fill #2
  Filled 2022-06-26: qty 4, 56d supply, fill #3
  Filled 2022-08-26: qty 2, 28d supply, fill #4

## 2022-01-08 ENCOUNTER — Other Ambulatory Visit (HOSPITAL_BASED_OUTPATIENT_CLINIC_OR_DEPARTMENT_OTHER): Payer: Self-pay

## 2022-01-08 MED ORDER — NEOMYCIN-POLYMYXIN-HC 3.5-10000-1 OT SUSP
OTIC | 1 refills | Status: DC
  Filled 2022-07-24: qty 10, 15d supply, fill #0
  Filled 2022-07-24: qty 10, 11d supply, fill #0
  Filled 2022-08-26: qty 10, 11d supply, fill #1

## 2022-01-09 ENCOUNTER — Other Ambulatory Visit (HOSPITAL_BASED_OUTPATIENT_CLINIC_OR_DEPARTMENT_OTHER): Payer: Self-pay

## 2022-01-09 MED ORDER — NEOMYCIN-POLYMYXIN-HC 3.5-10000-1 OT SUSP
3.0000 [drp] | Freq: Four times a day (QID) | OTIC | 1 refills | Status: DC
  Filled 2022-01-09: qty 10, 17d supply, fill #0
  Filled 2022-02-28: qty 10, 17d supply, fill #1

## 2022-01-09 MED ORDER — DEXCOM G7 SENSOR MISC
1 refills | Status: DC
  Filled 2022-01-09: qty 9, 90d supply, fill #0
  Filled ????-??-??: fill #0

## 2022-01-09 MED ORDER — DEXCOM G7 RECEIVER DEVI
1 refills | Status: DC
  Filled 2022-01-09: qty 1, 90d supply, fill #0

## 2022-01-10 ENCOUNTER — Other Ambulatory Visit (HOSPITAL_BASED_OUTPATIENT_CLINIC_OR_DEPARTMENT_OTHER): Payer: Self-pay

## 2022-01-13 ENCOUNTER — Other Ambulatory Visit (HOSPITAL_BASED_OUTPATIENT_CLINIC_OR_DEPARTMENT_OTHER): Payer: Self-pay

## 2022-01-14 ENCOUNTER — Other Ambulatory Visit (HOSPITAL_BASED_OUTPATIENT_CLINIC_OR_DEPARTMENT_OTHER): Payer: Self-pay

## 2022-01-16 ENCOUNTER — Other Ambulatory Visit: Payer: Self-pay

## 2022-02-28 ENCOUNTER — Other Ambulatory Visit (HOSPITAL_BASED_OUTPATIENT_CLINIC_OR_DEPARTMENT_OTHER): Payer: Self-pay

## 2022-02-28 MED ORDER — INSULIN GLARGINE 100 UNIT/ML SOLOSTAR PEN
PEN_INJECTOR | SUBCUTANEOUS | 11 refills | Status: DC
  Filled 2022-02-28: qty 15, 84d supply, fill #0
  Filled 2022-04-04: qty 15, 83d supply, fill #0
  Filled 2022-06-26: qty 15, 83d supply, fill #1
  Filled 2022-10-22: qty 15, 83d supply, fill #2

## 2022-03-03 ENCOUNTER — Other Ambulatory Visit (HOSPITAL_BASED_OUTPATIENT_CLINIC_OR_DEPARTMENT_OTHER): Payer: Self-pay

## 2022-03-04 ENCOUNTER — Other Ambulatory Visit (HOSPITAL_BASED_OUTPATIENT_CLINIC_OR_DEPARTMENT_OTHER): Payer: Self-pay

## 2022-03-05 ENCOUNTER — Other Ambulatory Visit (HOSPITAL_BASED_OUTPATIENT_CLINIC_OR_DEPARTMENT_OTHER): Payer: Self-pay

## 2022-03-06 ENCOUNTER — Other Ambulatory Visit (HOSPITAL_BASED_OUTPATIENT_CLINIC_OR_DEPARTMENT_OTHER): Payer: Self-pay

## 2022-03-07 ENCOUNTER — Other Ambulatory Visit (HOSPITAL_BASED_OUTPATIENT_CLINIC_OR_DEPARTMENT_OTHER): Payer: Self-pay

## 2022-03-10 ENCOUNTER — Other Ambulatory Visit (HOSPITAL_BASED_OUTPATIENT_CLINIC_OR_DEPARTMENT_OTHER): Payer: Self-pay

## 2022-03-11 ENCOUNTER — Other Ambulatory Visit (HOSPITAL_BASED_OUTPATIENT_CLINIC_OR_DEPARTMENT_OTHER): Payer: Self-pay

## 2022-03-12 ENCOUNTER — Other Ambulatory Visit (HOSPITAL_BASED_OUTPATIENT_CLINIC_OR_DEPARTMENT_OTHER): Payer: Self-pay

## 2022-03-13 ENCOUNTER — Other Ambulatory Visit (HOSPITAL_BASED_OUTPATIENT_CLINIC_OR_DEPARTMENT_OTHER): Payer: Self-pay

## 2022-03-14 ENCOUNTER — Other Ambulatory Visit (HOSPITAL_BASED_OUTPATIENT_CLINIC_OR_DEPARTMENT_OTHER): Payer: Self-pay

## 2022-03-17 ENCOUNTER — Other Ambulatory Visit (HOSPITAL_BASED_OUTPATIENT_CLINIC_OR_DEPARTMENT_OTHER): Payer: Self-pay

## 2022-03-18 ENCOUNTER — Other Ambulatory Visit (HOSPITAL_BASED_OUTPATIENT_CLINIC_OR_DEPARTMENT_OTHER): Payer: Self-pay

## 2022-04-04 ENCOUNTER — Other Ambulatory Visit (HOSPITAL_BASED_OUTPATIENT_CLINIC_OR_DEPARTMENT_OTHER): Payer: Self-pay

## 2022-04-14 ENCOUNTER — Other Ambulatory Visit (HOSPITAL_BASED_OUTPATIENT_CLINIC_OR_DEPARTMENT_OTHER): Payer: Self-pay

## 2022-04-23 ENCOUNTER — Other Ambulatory Visit (HOSPITAL_BASED_OUTPATIENT_CLINIC_OR_DEPARTMENT_OTHER): Payer: Self-pay

## 2022-04-25 ENCOUNTER — Other Ambulatory Visit (HOSPITAL_BASED_OUTPATIENT_CLINIC_OR_DEPARTMENT_OTHER): Payer: Self-pay

## 2022-06-26 ENCOUNTER — Other Ambulatory Visit (HOSPITAL_BASED_OUTPATIENT_CLINIC_OR_DEPARTMENT_OTHER): Payer: Self-pay

## 2022-06-27 ENCOUNTER — Other Ambulatory Visit (HOSPITAL_BASED_OUTPATIENT_CLINIC_OR_DEPARTMENT_OTHER): Payer: Self-pay

## 2022-06-27 MED ORDER — ATORVASTATIN CALCIUM 10 MG PO TABS
10.0000 mg | ORAL_TABLET | Freq: Every morning | ORAL | 1 refills | Status: DC
  Filled 2022-06-27 – 2022-08-26 (×2): qty 90, 90d supply, fill #0
  Filled 2022-10-22: qty 90, 90d supply, fill #1

## 2022-06-27 MED ORDER — LISINOPRIL 10 MG PO TABS
10.0000 mg | ORAL_TABLET | Freq: Every morning | ORAL | 1 refills | Status: DC
  Filled 2022-06-27: qty 90, fill #0
  Filled 2022-08-26: qty 90, 90d supply, fill #0
  Filled 2022-10-22: qty 90, 90d supply, fill #1

## 2022-06-27 MED ORDER — DAPAGLIFLOZIN PROPANEDIOL 10 MG PO TABS
10.0000 mg | ORAL_TABLET | Freq: Every morning | ORAL | 1 refills | Status: DC
  Filled 2022-06-27: qty 90, 90d supply, fill #0
  Filled 2022-10-22: qty 90, 90d supply, fill #1

## 2022-06-28 MED ORDER — TRIAMCINOLONE ACETONIDE 55 MCG/ACT NA AERO
INHALATION_SPRAY | NASAL | 2 refills | Status: DC
  Filled 2022-06-28: qty 33.8, 120d supply, fill #0
  Filled 2022-08-26: qty 33.8, 90d supply, fill #1

## 2022-06-30 ENCOUNTER — Other Ambulatory Visit (HOSPITAL_BASED_OUTPATIENT_CLINIC_OR_DEPARTMENT_OTHER): Payer: Self-pay

## 2022-07-10 ENCOUNTER — Other Ambulatory Visit (HOSPITAL_BASED_OUTPATIENT_CLINIC_OR_DEPARTMENT_OTHER): Payer: Self-pay

## 2022-07-11 ENCOUNTER — Other Ambulatory Visit (HOSPITAL_BASED_OUTPATIENT_CLINIC_OR_DEPARTMENT_OTHER): Payer: Self-pay

## 2022-07-11 MED ORDER — METFORMIN HCL ER 500 MG PO TB24
1000.0000 mg | ORAL_TABLET | Freq: Two times a day (BID) | ORAL | 1 refills | Status: DC
  Filled 2022-07-11: qty 360, fill #0
  Filled 2022-07-24 – 2022-08-26 (×2): qty 360, 90d supply, fill #0
  Filled 2022-10-22: qty 360, 90d supply, fill #1

## 2022-07-24 ENCOUNTER — Other Ambulatory Visit (HOSPITAL_BASED_OUTPATIENT_CLINIC_OR_DEPARTMENT_OTHER): Payer: Self-pay

## 2022-07-24 DIAGNOSIS — H7392 Unspecified disorder of tympanic membrane, left ear: Secondary | ICD-10-CM | POA: Diagnosis not present

## 2022-07-24 DIAGNOSIS — K219 Gastro-esophageal reflux disease without esophagitis: Secondary | ICD-10-CM | POA: Diagnosis not present

## 2022-08-07 ENCOUNTER — Encounter (HOSPITAL_COMMUNITY): Payer: Self-pay | Admitting: Emergency Medicine

## 2022-08-07 ENCOUNTER — Ambulatory Visit (HOSPITAL_COMMUNITY)
Admission: EM | Admit: 2022-08-07 | Discharge: 2022-08-07 | Disposition: A | Discharge status: Home / Self Care | Payer: 59 | Attending: Family Medicine | Admitting: Family Medicine

## 2022-08-07 ENCOUNTER — Other Ambulatory Visit: Payer: Self-pay

## 2022-08-07 ENCOUNTER — Ambulatory Visit (INDEPENDENT_AMBULATORY_CARE_PROVIDER_SITE_OTHER): Payer: 59

## 2022-08-07 DIAGNOSIS — M545 Low back pain, unspecified: Secondary | ICD-10-CM

## 2022-08-07 DIAGNOSIS — R29898 Other symptoms and signs involving the musculoskeletal system: Secondary | ICD-10-CM

## 2022-08-07 DIAGNOSIS — R200 Anesthesia of skin: Secondary | ICD-10-CM | POA: Diagnosis not present

## 2022-08-07 NOTE — Discharge Instructions (Addendum)
You were seen today for low back pain with numbness/tingling and weakness of the right leg.  Your xray did show moderate to severe degenerative changes.  I am concerned your symptoms are due to a disc issue.  We cannot see this on xray and you may need further imaging (MRI) to look at this closer.  Given your symptoms I do want to follow up with the spine specialists.  Please call [facility] at [phone].  To find yourself a primary care provider please go to [email or web address] to find one in your area.

## 2022-08-07 NOTE — ED Provider Notes (Signed)
[facility]    CSN: [id number] Arrival date & time: 08/07/22  4098      History   Chief Complaint Chief Complaint  Patient presents with   Back Pain    HPI Jamie Foster is a 56 y.o. male.   Yesterday was getting into the car and had a sudden pain in the low back, as well as numbness of the left leg.  This has continued since yesterday, but has not improved.  No physical pain, but feels numbness and tingling into the leg and feels that he cannot use the leg.  He feels he is dragging the leg.  No loss of bowel or bladder.  All else is normal.   Past Medical History:  Diagnosis Date   Allergy    Diabetes mellitus without complication (HCC)    H pylori ulcer    Hyperlipidemia    Hypertension    Kidney stone    Sleep apnea     Patient Active Problem List   Diagnosis Date Noted   Uncontrolled type 2 diabetes mellitus with hyperglycemia (HCC) 08/20/2020   Hyperlipidemia 01/23/2019   Low back pain without sciatica 01/23/2019   Uncontrolled type 2 diabetes mellitus with retinopathy, without long-term current use of insulin 12/21/2015   Family hx of prostate cancer 06/22/2013   OSA (obstructive sleep apnea) 10/06/2012   BMI 30.0-30.9,adult 07/19/2012   HTN (hypertension) 07/19/2012   GERD (gastroesophageal reflux disease) 07/19/2012   DDD (degenerative disc disease), cervical 07/19/2012    Past Surgical History:  Procedure Laterality Date   NASAL SINUS SURGERY         Home Medications    Prior to Admission medications   Medication Sig Start Date End Date Taking? Authorizing Provider  amoxicillin (AMOXIL) 500 MG capsule Take 1 capsule (500 mg total) by mouth 3 (three) times daily until finished. 11/13/21     atorvastatin (LIPITOR) 10 MG tablet TAKE 1 TABLET BY MOUTH EVERY MORNING 12/21/20 12/21/21    atorvastatin (LIPITOR) 10 MG tablet Take 1 tablet by mouth every morning 06/27/22     atorvastatin (LIPITOR) 40 MG tablet Take 1 tablet (40 mg total) by  mouth daily. 05/11/20   Sharlene Dory, DO  Continuous Blood Gluc Receiver (DEXCOM G6 RECEIVER) DEVI 1 Device by Does not apply route as directed. 06/11/20   Sharlene Dory, DO  Continuous Blood Gluc Receiver (DEXCOM G7 RECEIVER) DEVI Use 1 unit as directed as directed 01/08/22     Continuous Blood Gluc Receiver (FREESTYLE LIBRE 2 READER) DEVI Hold 1 unit via meter every three to four hours while awake one reader per person 11/18/21     Continuous Blood Gluc Sensor (DEXCOM G6 SENSOR) MISC 1 Device by Does not apply route as directed. 06/11/20   Sharlene Dory, DO  Continuous Blood Gluc Sensor (DEXCOM G7 SENSOR) MISC Apply 1 sensor to skin as directed then change every 10 days 01/08/22     Continuous Blood Gluc Sensor (FREESTYLE LIBRE 2 SENSOR) MISC Apply 1 sensor once every 14 days 01/07/22     Continuous Blood Gluc Transmit (DEXCOM G6 TRANSMITTER) MISC 1 Device by Does not apply route as directed. 06/11/20   Wendling, Jilda Roche, DO  COVID-19 mRNA bivalent vaccine, Pfizer, (PFIZER COVID-19 VAC BIVALENT) injection Inject into the muscle. 10/09/21   Judyann Munson, MD  COVID-19 mRNA Vac-TriS, Pfizer, SUSP injection Inject into the muscle. 05/02/21   Judyann Munson, MD  cyclobenzaprine (FLEXERIL) 10 MG tablet Take 1/2 tablet by  mouth three times a day Patient not taking: Reported on 08/07/2022 01/07/22     dapagliflozin propanediol (FARXIGA) 10 MG TABS tablet Take 1 tablet by mouth every morning 06/27/22     glipiZIDE (GLUCOTROL) 10 MG tablet TAKE 1 TABLET (10 MG TOTAL) BY MOUTH 2 (TWO) TIMES DAILY BEFORE A MEAL. 06/11/20 06/11/21  Sharlene Dory, DO  glipiZIDE (GLUCOTROL) 10 MG tablet TAKE 1 TABLET BY MOUTH EVERY MORNING 08/06/21 08/06/22    glipiZIDE (GLUCOTROL) 10 MG tablet TAKE 1 TABLET BY MOUTH EVERY MORNING 11/18/21     glucose blood (TRUETRACK TEST) test strip Use 2x a day 05/17/18   Shade Flood, MD  ibuprofen (ADVIL) 800 MG tablet Take 1 tablet by mouth every 8  hours as needed for pain 10/22/21     ibuprofen (ADVIL) 800 MG tablet Take 1 tablet (800 mg total) by mouth every 8 (eight) hours as needed pain. 11/13/21     Insulin Glargine (BASAGLAR KWIKPEN) 100 UNIT/ML INJECT 0.18 MLS (18 UNITS TOTAL) INTO THE SKIN DAILY. 06/11/20 06/11/21  Sharlene Dory, DO  insulin glargine (LANTUS SOLOSTAR) 100 UNIT/ML Solostar Pen Inject 18 Units into the skin at bedtime. 08/20/20   Sharlene Dory, DO  insulin glargine (LANTUS) 100 UNIT/ML Solostar Pen Inject 18 untis into the skin at bedtime 02/28/22     Insulin Pen Needle 32G X 4 MM MISC Use 1x a day 01/20/19   Carlus Pavlov, MD  ketoconazole (NIZORAL) 2 % cream APPLY TO THE AFFECTED AREA(S) ON SKIN ONCE DAILY 09/04/20   Wendling, Jilda Roche, DO  ketoconazole (NIZORAL) 2 % cream Apply once liberally to affected area once a day 11/18/21     ketoconazole (NIZORAL) 2 % cream Apply 1 liberally to affected area once a day 11/18/21     lisinopril (ZESTRIL) 10 MG tablet TAKE 1 TABLET BY MOUTH EVERY MORNING 12/21/20 12/21/21    lisinopril (ZESTRIL) 10 MG tablet TAKE 1 TABLET (10 MG TOTAL) BY MOUTH DAILY. 05/11/20 05/11/21  Sharlene Dory, DO  lisinopril (ZESTRIL) 10 MG tablet Take 1 tablet by mouth every morning for hypertension 06/27/22     meloxicam (MOBIC) 15 MG tablet Take 1 tablet (15 mg total) by mouth daily. Patient not taking: Reported on 08/07/2022 01/21/19   Georgina Quint, MD  meloxicam [facility]) 15 MG tablet Take 1 tablet by mouth once a day Patient not taking: Reported on 08/07/2022 11/18/21     metFORMIN (GLUCOPHAGE-XR) 500 MG 24 hr tablet TAKE 2 TABLETS BY MOUTH TWO TIMES DAILY 05/11/20 05/11/21  Sharlene Dory, DO  metFORMIN (GLUCOPHAGE-XR) 500 MG 24 hr tablet TAKE 2 TABLETS BY MOUTH EVERY MORNING 08/06/21 08/06/22    metFORMIN (GLUCOPHAGE-XR) 500 MG 24 hr tablet Take 2 tablets by mouth every morning with meals 11/18/21     metFORMIN (GLUCOPHAGE-XR) 500 MG 24 hr tablet Take 2  tablets by mouth twice a day with meals 07/11/22     neomycin-polymyxin-hydrocortisone (CORTISPORIN) 3.5-10000-1 OTIC suspension Instill 3 drop into both ears twice a day 11/18/21     neomycin-polymyxin-hydrocortisone (CORTISPORIN) 3.5-10000-1 OTIC suspension Instill 3 drop into both ears twice a day PLACE 3 DROPS INTO LEFT EAR 4 TIMES A DAY 01/07/22     neomycin-polymyxin-hydrocortisone (CORTISPORIN) 3.5-10000-1 OTIC suspension Place 3 drops into both ears 4 (four) times daily. 01/08/22     omeprazole (PRILOSEC) 40 MG capsule Take 1 capsule by mouth everyday before dinner 05/15/21   Drema Halon, MD  [facility] DELICA LANCETS 33G MISC  Use to test blood sugar 2 times daily as instructed. 01/24/15   Carlus Pavlov, MD  polyethylene glycol (MIRALAX / GLYCOLAX) packet Take 17 g by mouth daily. 10/30/18   Bing Neighbors, FNP  prednisoLONE acetate (PRED FORTE) 1 % ophthalmic suspension Instill 1 drop into right eye twice a day 11/18/21     Semaglutide, 1 MG/DOSE, (OZEMPIC, 1 MG/DOSE,) 4 MG/3ML SOPN Inject 1 mg under the skin once a week 11/18/21     triamcinolone (NASACORT) 55 MCG/ACT AERO nasal inhaler Place 2 squirts in each nostril at night before bedtime 06/28/22     [facility] LANCETS MISC Use 2x a day - Tru Track 06/09/14   Carlus Pavlov, MD    Family History Family History  Problem Relation Age of Onset   Hypertension Mother    Prostate cancer Father    Colon cancer Neg Hx    Colon polyps Neg Hx     Social History Social History   Tobacco Use   Smoking status: Never   Smokeless tobacco: Never  Vaping Use   Vaping Use: Never used  Substance Use Topics   Alcohol use: No   Drug use: No     Allergies   Patient has no known allergies.   Review of Systems Review of Systems  Constitutional: Negative.   HENT: Negative.    Respiratory: Negative.    Cardiovascular: Negative.   Gastrointestinal: Negative.   Genitourinary: Negative.   Musculoskeletal:  Positive for back  pain.  Neurological:  Positive for numbness.     Physical Exam Triage Vital Signs ED Triage Vitals  Enc Vitals Group     BP 08/07/22 1125 (!) 160/100     Pulse Rate 08/07/22 1125 86     Resp 08/07/22 1125 20     Temp 08/07/22 1125 98.5 F (36.9 C)     Temp Source 08/07/22 1125 Oral     SpO2 08/07/22 1125 97 %     Weight --      Height --      Head Circumference --      Peak Flow --      Pain Score 08/07/22 1121 10     Pain Loc --      Pain Edu? --      Excl. in GC? --    No data found.  Updated Vital Signs BP (!) 154/98 (BP Location: Left Arm) Comment: borderline sizing on cuff.  sizing between, regular and large Comment (BP Location): large cuff  Pulse 86   Temp 98.5 F (36.9 C) (Oral)   Resp 20   SpO2 97%   Visual Acuity Right Eye Distance:   Left Eye Distance:   Bilateral Distance:    Right Eye Near:   Left Eye Near:    Bilateral Near:     Physical Exam Constitutional:      Appearance: Normal appearance.  Musculoskeletal:     Comments: No spinous TTP;  One area of TTP just right of the lumbar spine;  4/5 strength at the right LE, 5/5 at the left LE Normal sensation to light touch.  During ambulation he does favor the right, slight drag of the leg  Skin:    General: Skin is warm.  Neurological:     Mental Status: He is alert.  Psychiatric:        Mood and Affect: Mood normal.      UC Treatments / Results  Labs (all labs ordered are listed, but only abnormal results  are displayed) Labs Reviewed - No data to display  EKG   Radiology DG Lumbar Spine Complete  Result Date: 08/07/2022 CLINICAL DATA:  Back pain, numbness right leg EXAM: LUMBAR SPINE - COMPLETE 4+ VIEW COMPARISON:  None Available. FINDINGS: Motion artifacts in some of the images limit evaluation. As far as seen, no recent fracture is seen. There is minimal retrolisthesis at L3-L4 level. Degenerative changes are noted with bony spurs and facet hypertrophy. Anterior bony spurs are  more prominent at T12-L1, L1-L2 and L4-L5 levels. Facet degeneration is more prominent at L4-L5 and L5-S1 levels. Paraspinal soft tissues are unremarkable. IMPRESSION: No recent fracture is seen in the lumbar spine. Moderate to severe degenerative changes are noted in lumbar spine. Electronically Signed   By: Ernie Avena M.D.   On: 08/07/2022 12:20    Procedures Procedures (including critical care time)  Medications Ordered in UC Medications - No data to display  Initial Impression / Assessment and Plan / UC Course  I have reviewed the triage vital signs and the nursing notes.  Pertinent labs & imaging results that were available during my care of the patient were reviewed by me and considered in my medical decision making (see chart for details).    Final Clinical Impressions(s) / UC Diagnoses   Final diagnoses:  Low back pain potentially associated with radiculopathy  Weakness of right leg     Discharge Instructions      You were seen today for low back pain with numbness/tingling and weakness of the right leg.  Your xray did show moderate to severe degenerative changes.  I am concerned your symptoms are due to a disc issue.  We cannot see this on xray and you may need further imaging (MRI) to look at this closer.  Given your symptoms I do want to follow up with the spine specialists.  Please call [facility] at [phone].  To find yourself a primary care provider please go to [email or web address] to find one in your area.     ED Prescriptions   None    PDMP not reviewed this encounter.   Jannifer Franklin, MD 08/07/22 1225

## 2022-08-07 NOTE — ED Triage Notes (Signed)
Patient reports he was getting into the driver side of a car yesterday.  Had sudden, pinching pain in right lower back and weakness in right leg.  Reports he still has pain and continues to have weakness in right leg.  Denies any history of this

## 2022-08-21 DIAGNOSIS — H2513 Age-related nuclear cataract, bilateral: Secondary | ICD-10-CM | POA: Diagnosis not present

## 2022-08-21 DIAGNOSIS — H524 Presbyopia: Secondary | ICD-10-CM | POA: Diagnosis not present

## 2022-08-21 DIAGNOSIS — H43393 Other vitreous opacities, bilateral: Secondary | ICD-10-CM | POA: Diagnosis not present

## 2022-08-21 DIAGNOSIS — E113293 Type 2 diabetes mellitus with mild nonproliferative diabetic retinopathy without macular edema, bilateral: Secondary | ICD-10-CM | POA: Diagnosis not present

## 2022-08-21 DIAGNOSIS — H5203 Hypermetropia, bilateral: Secondary | ICD-10-CM | POA: Diagnosis not present

## 2022-08-21 DIAGNOSIS — H52223 Regular astigmatism, bilateral: Secondary | ICD-10-CM | POA: Diagnosis not present

## 2022-08-21 DIAGNOSIS — Z794 Long term (current) use of insulin: Secondary | ICD-10-CM | POA: Diagnosis not present

## 2022-08-21 DIAGNOSIS — H35043 Retinal micro-aneurysms, unspecified, bilateral: Secondary | ICD-10-CM | POA: Diagnosis not present

## 2022-08-26 ENCOUNTER — Other Ambulatory Visit (HOSPITAL_BASED_OUTPATIENT_CLINIC_OR_DEPARTMENT_OTHER): Payer: Self-pay

## 2022-08-27 ENCOUNTER — Other Ambulatory Visit (HOSPITAL_BASED_OUTPATIENT_CLINIC_OR_DEPARTMENT_OTHER): Payer: Self-pay

## 2022-09-05 DIAGNOSIS — H608X2 Other otitis externa, left ear: Secondary | ICD-10-CM | POA: Diagnosis not present

## 2022-09-05 DIAGNOSIS — R0982 Postnasal drip: Secondary | ICD-10-CM | POA: Diagnosis not present

## 2022-09-05 DIAGNOSIS — H903 Sensorineural hearing loss, bilateral: Secondary | ICD-10-CM | POA: Diagnosis not present

## 2022-09-09 ENCOUNTER — Other Ambulatory Visit (HOSPITAL_BASED_OUTPATIENT_CLINIC_OR_DEPARTMENT_OTHER): Payer: Self-pay

## 2022-09-11 ENCOUNTER — Other Ambulatory Visit (HOSPITAL_BASED_OUTPATIENT_CLINIC_OR_DEPARTMENT_OTHER): Payer: Self-pay

## 2022-09-11 DIAGNOSIS — K219 Gastro-esophageal reflux disease without esophagitis: Secondary | ICD-10-CM | POA: Diagnosis not present

## 2022-09-11 MED ORDER — FLUOCINOLONE ACETONIDE 0.01 % OT OIL
4.0000 [drp] | TOPICAL_OIL | Freq: Two times a day (BID) | OTIC | 0 refills | Status: DC
  Filled 2022-09-11: qty 20, 7d supply, fill #0

## 2022-09-11 MED ORDER — ESOMEPRAZOLE MAGNESIUM 40 MG PO CPDR
40.0000 mg | DELAYED_RELEASE_CAPSULE | Freq: Every day | ORAL | 1 refills | Status: DC
  Filled 2022-09-11: qty 15, 15d supply, fill #0
  Filled 2022-09-11: qty 30, 30d supply, fill #0
  Filled 2022-09-16: qty 30, 30d supply, fill #1

## 2022-09-11 MED ORDER — COMIRNATY 30 MCG/0.3ML IM SUSY
PREFILLED_SYRINGE | INTRAMUSCULAR | 0 refills | Status: DC
  Filled 2022-09-11: qty 0.3, 1d supply, fill #0

## 2022-09-11 MED ORDER — NEOMYCIN-POLYMYXIN-HC 3.5-10000-1 OT SUSP
3.0000 [drp] | Freq: Four times a day (QID) | OTIC | 1 refills | Status: DC
  Filled 2022-09-11: qty 10, 17d supply, fill #0
  Filled 2022-10-22: qty 10, 17d supply, fill #1

## 2022-09-12 ENCOUNTER — Other Ambulatory Visit (HOSPITAL_BASED_OUTPATIENT_CLINIC_OR_DEPARTMENT_OTHER): Payer: Self-pay

## 2022-09-13 MED ORDER — FREESTYLE LIBRE 2 SENSOR MISC
4 refills | Status: DC
  Filled 2022-09-13: qty 6, 84d supply, fill #0
  Filled 2022-10-22: qty 6, 84d supply, fill #1
  Filled 2023-03-02: qty 2, 28d supply, fill #2
  Filled 2023-03-17: qty 2, 28d supply, fill #3
  Filled 2023-07-14: qty 2, 28d supply, fill #4
  Filled 2023-08-07: qty 2, 28d supply, fill #5

## 2022-09-15 ENCOUNTER — Other Ambulatory Visit (HOSPITAL_BASED_OUTPATIENT_CLINIC_OR_DEPARTMENT_OTHER): Payer: Self-pay

## 2022-09-16 ENCOUNTER — Other Ambulatory Visit (HOSPITAL_BASED_OUTPATIENT_CLINIC_OR_DEPARTMENT_OTHER): Payer: Self-pay

## 2022-09-16 MED ORDER — NEXIUM 40 MG PO CPDR
40.0000 mg | DELAYED_RELEASE_CAPSULE | Freq: Every day | ORAL | 1 refills | Status: DC
  Filled 2022-09-16 – 2022-10-22 (×2): qty 30, 30d supply, fill #0

## 2022-09-17 ENCOUNTER — Other Ambulatory Visit (HOSPITAL_BASED_OUTPATIENT_CLINIC_OR_DEPARTMENT_OTHER): Payer: Self-pay

## 2022-09-17 DIAGNOSIS — R0982 Postnasal drip: Secondary | ICD-10-CM | POA: Diagnosis not present

## 2022-09-17 DIAGNOSIS — R0600 Dyspnea, unspecified: Secondary | ICD-10-CM | POA: Diagnosis not present

## 2022-09-17 DIAGNOSIS — J3489 Other specified disorders of nose and nasal sinuses: Secondary | ICD-10-CM | POA: Diagnosis not present

## 2022-09-17 DIAGNOSIS — K219 Gastro-esophageal reflux disease without esophagitis: Secondary | ICD-10-CM | POA: Diagnosis not present

## 2022-09-17 MED ORDER — FLUOCINOLONE ACETONIDE 0.01 % OT OIL
4.0000 [drp] | TOPICAL_OIL | Freq: Two times a day (BID) | OTIC | 0 refills | Status: DC
  Filled 2022-09-17: qty 20, 7d supply, fill #0

## 2022-09-18 ENCOUNTER — Other Ambulatory Visit (HOSPITAL_BASED_OUTPATIENT_CLINIC_OR_DEPARTMENT_OTHER): Payer: Self-pay

## 2022-09-29 ENCOUNTER — Other Ambulatory Visit (HOSPITAL_BASED_OUTPATIENT_CLINIC_OR_DEPARTMENT_OTHER): Payer: Self-pay

## 2022-09-30 ENCOUNTER — Other Ambulatory Visit (HOSPITAL_BASED_OUTPATIENT_CLINIC_OR_DEPARTMENT_OTHER): Payer: Self-pay

## 2022-10-03 ENCOUNTER — Other Ambulatory Visit (HOSPITAL_BASED_OUTPATIENT_CLINIC_OR_DEPARTMENT_OTHER): Payer: Self-pay

## 2022-10-22 ENCOUNTER — Other Ambulatory Visit (HOSPITAL_BASED_OUTPATIENT_CLINIC_OR_DEPARTMENT_OTHER): Payer: Self-pay

## 2022-10-22 MED ORDER — ESOMEPRAZOLE MAGNESIUM 40 MG PO CPDR
40.0000 mg | DELAYED_RELEASE_CAPSULE | Freq: Every day | ORAL | 1 refills | Status: DC
  Filled 2022-10-22: qty 60, 60d supply, fill #0

## 2022-10-22 MED ORDER — GLIPIZIDE 10 MG PO TABS
10.0000 mg | ORAL_TABLET | Freq: Every morning | ORAL | 1 refills | Status: DC
  Filled 2022-10-22: qty 90, 90d supply, fill #0

## 2022-10-23 ENCOUNTER — Other Ambulatory Visit (HOSPITAL_BASED_OUTPATIENT_CLINIC_OR_DEPARTMENT_OTHER): Payer: Self-pay

## 2022-10-24 ENCOUNTER — Other Ambulatory Visit (HOSPITAL_BASED_OUTPATIENT_CLINIC_OR_DEPARTMENT_OTHER): Payer: Self-pay

## 2022-10-24 MED ORDER — ESOMEPRAZOLE MAGNESIUM 40 MG PO CPDR
40.0000 mg | DELAYED_RELEASE_CAPSULE | Freq: Every day | ORAL | 1 refills | Status: DC
  Filled 2022-10-24: qty 30, 30d supply, fill #0

## 2022-10-28 ENCOUNTER — Other Ambulatory Visit (HOSPITAL_BASED_OUTPATIENT_CLINIC_OR_DEPARTMENT_OTHER): Payer: Self-pay

## 2022-10-29 ENCOUNTER — Ambulatory Visit
Admission: EM | Admit: 2022-10-29 | Discharge: 2022-10-29 | Disposition: A | Discharge status: Home / Self Care | Payer: 59 | Attending: Emergency Medicine | Admitting: Emergency Medicine

## 2022-10-29 ENCOUNTER — Other Ambulatory Visit (HOSPITAL_BASED_OUTPATIENT_CLINIC_OR_DEPARTMENT_OTHER): Payer: Self-pay

## 2022-10-29 DIAGNOSIS — N23 Unspecified renal colic: Secondary | ICD-10-CM | POA: Diagnosis not present

## 2022-10-29 LAB — POCT URINALYSIS DIP (MANUAL ENTRY)
Bilirubin, UA: NEGATIVE
Glucose, UA: 500 mg/dL — AB
Ketones, POC UA: NEGATIVE mg/dL
Leukocytes, UA: NEGATIVE
Nitrite, UA: NEGATIVE
Protein Ur, POC: 100 mg/dL — AB
Spec Grav, UA: 1.025 (ref 1.010–1.025)
Urobilinogen, UA: 0.2 E.U./dL
pH, UA: 5.5 (ref 5.0–8.0)

## 2022-10-29 MED ORDER — TAMSULOSIN HCL 0.4 MG PO CAPS
0.4000 mg | ORAL_CAPSULE | Freq: Every day | ORAL | 0 refills | Status: DC
  Filled 2022-10-29: qty 30, 30d supply, fill #0

## 2022-10-29 MED ORDER — DICLOFENAC SODIUM 50 MG PO TBEC
50.0000 mg | DELAYED_RELEASE_TABLET | Freq: Two times a day (BID) | ORAL | 0 refills | Status: DC
  Filled 2022-10-29: qty 10, 5d supply, fill #0

## 2022-10-29 MED ORDER — KETOROLAC TROMETHAMINE 30 MG/ML IJ SOLN
30.0000 mg | Freq: Once | INTRAMUSCULAR | Status: AC
  Administered 2022-10-29: 30 mg via INTRAMUSCULAR

## 2022-10-29 NOTE — ED Provider Notes (Signed)
[facility]    CSN: [id number] Arrival date & time: 10/29/22  1039      History   Chief Complaint Chief Complaint  Patient presents with   Back Pain    HPI Jamie Foster is a 56 y.o. male.   Patient presents with concerns of right mid/lower back and flank pain. He states it started about 2 days ago. He has sharp severe pain that comes and goes. He states it comes on quick then eventually goes away. He has minimal discomfort between episodes. He denies any notable trigger such as movement or position and moving doesn't seem to make it worse. He has tried Tylenol and motrin without much improvement. He denies fever, nausea, vomiting, urinary frequency/urgency, dysuria, or hematuria. He reports history of back problems in the past and also had a kidney stone in the past with similar symptoms.   The history is provided by the patient.  Back Pain Associated symptoms: no abdominal pain, no dysuria, no fever and no headaches     Past Medical History:  Diagnosis Date   Allergy    Diabetes mellitus without complication (HCC)    H pylori ulcer    Hyperlipidemia    Hypertension    Kidney stone    Sleep apnea     Patient Active Problem List   Diagnosis Date Noted   Uncontrolled type 2 diabetes mellitus with hyperglycemia (HCC) 08/20/2020   Hyperlipidemia 01/23/2019   Low back pain without sciatica 01/23/2019   Uncontrolled type 2 diabetes mellitus with retinopathy, without long-term current use of insulin 12/21/2015   Family hx of prostate cancer 06/22/2013   OSA (obstructive sleep apnea) 10/06/2012   BMI 30.0-30.9,adult 07/19/2012   HTN (hypertension) 07/19/2012   GERD (gastroesophageal reflux disease) 07/19/2012   DDD (degenerative disc disease), cervical 07/19/2012    Past Surgical History:  Procedure Laterality Date   NASAL SINUS SURGERY         Home Medications    Prior to Admission medications   Medication Sig Start Date End Date Taking?  Authorizing Provider  diclofenac (VOLTAREN) 50 MG EC tablet Take 1 tablet (50 mg total) by mouth 2 (two) times daily. 10/29/22  Yes Navaya Wiatrek L, PA  tamsulosin (FLOMAX) 0.4 MG CAPS capsule Take 1 capsule (0.4 mg total) by mouth daily. 10/29/22  Yes Joseguadalupe Stan L, PA  amoxicillin (AMOXIL) 500 MG capsule Take 1 capsule (500 mg total) by mouth 3 (three) times daily until finished. 11/13/21     atorvastatin (LIPITOR) 10 MG tablet TAKE 1 TABLET BY MOUTH EVERY MORNING 12/21/20 12/21/21    atorvastatin (LIPITOR) 10 MG tablet Take 1 tablet by mouth every morning 06/27/22     atorvastatin (LIPITOR) 40 MG tablet Take 1 tablet (40 mg total) by mouth daily. 05/11/20   Sharlene DoryWendling, Nicholas Paul, DO  Continuous Blood Gluc Receiver (DEXCOM G6 RECEIVER) DEVI 1 Device by Does not apply route as directed. 06/11/20   Sharlene DoryWendling, Nicholas Paul, DO  Continuous Blood Gluc Receiver (DEXCOM G7 RECEIVER) DEVI Use 1 unit as directed as directed 01/08/22     Continuous Blood Gluc Receiver (FREESTYLE LIBRE 2 READER) DEVI Hold 1 unit via meter every three to four hours while awake one reader per person 11/18/21     Continuous Blood Gluc Sensor (DEXCOM G6 SENSOR) MISC 1 Device by Does not apply route as directed. 06/11/20   Sharlene DoryWendling, Nicholas Paul, DO  Continuous Blood Gluc Sensor (DEXCOM G7 SENSOR) MISC Apply 1 sensor to skin as  directed then change every 10 days 01/08/22     Continuous Blood Gluc Sensor (FREESTYLE LIBRE 2 SENSOR) MISC Apply 1 sensor once every 14 days 09/13/22     Continuous Blood Gluc Transmit (DEXCOM G6 TRANSMITTER) MISC 1 Device by Does not apply route as directed. 06/11/20   Wendling, Jilda Roche, DO  COVID-19 mRNA bivalent vaccine, Pfizer, (PFIZER COVID-19 VAC BIVALENT) injection Inject into the muscle. 10/09/21   Judyann Munson, MD  COVID-19 mRNA Vac-TriS, Pfizer, SUSP injection Inject into the muscle. 05/02/21   Judyann Munson, MD  COVID-19 mRNA vaccine [phone] (COMIRNATY) syringe Inject into the muscle. 09/11/22    Judyann Munson, MD  cyclobenzaprine (FLEXERIL) 10 MG tablet Take 1/2 tablet by mouth three times a day Patient not taking: Reported on 08/07/2022 01/07/22     dapagliflozin propanediol (FARXIGA) 10 MG TABS tablet Take 1 tablet by mouth every morning 06/27/22     esomeprazole (NEXIUM) 40 MG capsule Take 1 capsule (40 mg total) by mouth daily. 09/11/22     esomeprazole (NEXIUM) 40 MG capsule Take 1 capsule (40 mg total) by mouth daily. 10/22/22     esomeprazole (NEXIUM) 40 MG capsule Take 1 capsule (40 mg total) by mouth daily. 10/24/22     Fluocinolone Acetonide 0.01 % OIL Place 4 drops into the left ear 2 (two) times daily. 09/17/22     glipiZIDE (GLUCOTROL) 10 MG tablet TAKE 1 TABLET (10 MG TOTAL) BY MOUTH 2 (TWO) TIMES DAILY BEFORE A MEAL. 06/11/20 06/11/21  Sharlene Dory, DO  glipiZIDE (GLUCOTROL) 10 MG tablet TAKE 1 TABLET BY MOUTH EVERY MORNING 08/06/21 08/06/22    glipiZIDE (GLUCOTROL) 10 MG tablet TAKE 1 TABLET BY MOUTH EVERY MORNING 10/22/22     glucose blood (TRUETRACK TEST) test strip Use 2x a day 05/17/18   Shade Flood, MD  ibuprofen (ADVIL) 800 MG tablet Take 1 tablet by mouth every 8 hours as needed for pain 10/22/21     ibuprofen (ADVIL) 800 MG tablet Take 1 tablet (800 mg total) by mouth every 8 (eight) hours as needed pain. 11/13/21     Insulin Glargine (BASAGLAR KWIKPEN) 100 UNIT/ML INJECT 0.18 MLS (18 UNITS TOTAL) INTO THE SKIN DAILY. 06/11/20 06/11/21  Sharlene Dory, DO  insulin glargine (LANTUS SOLOSTAR) 100 UNIT/ML Solostar Pen Inject 18 Units into the skin at bedtime. 08/20/20   Sharlene Dory, DO  insulin glargine (LANTUS) 100 UNIT/ML Solostar Pen Inject 18 untis into the skin at bedtime 02/28/22     Insulin Pen Needle 32G X 4 MM MISC Use 1x a day 01/20/19   Carlus Pavlov, MD  ketoconazole (NIZORAL) 2 % cream APPLY TO THE AFFECTED AREA(S) ON SKIN ONCE DAILY 09/04/20   Wendling, Jilda Roche, DO  ketoconazole (NIZORAL) 2 % cream Apply once liberally  to affected area once a day 11/18/21     ketoconazole (NIZORAL) 2 % cream Apply 1 liberally to affected area once a day 11/18/21     lisinopril (ZESTRIL) 10 MG tablet TAKE 1 TABLET BY MOUTH EVERY MORNING 12/21/20 12/21/21    lisinopril (ZESTRIL) 10 MG tablet TAKE 1 TABLET (10 MG TOTAL) BY MOUTH DAILY. 05/11/20 05/11/21  Sharlene Dory, DO  lisinopril (ZESTRIL) 10 MG tablet Take 1 tablet (10 mg total) by mouth every morning for hypertension 06/27/22     meloxicam (MOBIC) 15 MG tablet Take 1 tablet (15 mg total) by mouth daily. Patient not taking: Reported on 08/07/2022 01/21/19   Georgina Quint, MD  meloxicam (  MOBIC) 15 MG tablet Take 1 tablet by mouth once a day Patient not taking: Reported on 08/07/2022 11/18/21     metFORMIN (GLUCOPHAGE-XR) 500 MG 24 hr tablet TAKE 2 TABLETS BY MOUTH TWO TIMES DAILY 05/11/20 05/11/21  Sharlene Dory, DO  metFORMIN (GLUCOPHAGE-XR) 500 MG 24 hr tablet TAKE 2 TABLETS BY MOUTH EVERY MORNING 08/06/21 08/06/22    metFORMIN (GLUCOPHAGE-XR) 500 MG 24 hr tablet Take 2 tablets by mouth every morning with meals 11/18/21     metFORMIN (GLUCOPHAGE-XR) 500 MG 24 hr tablet Take 2 tablets (1,000 mg total) by mouth 2 (two) times daily with a meal. 07/11/22     neomycin-polymyxin-hydrocortisone (CORTISPORIN) 3.5-10000-1 OTIC suspension Instill 3 drop into both ears twice a day 11/18/21     neomycin-polymyxin-hydrocortisone (CORTISPORIN) 3.5-10000-1 OTIC suspension Place 3 drops into both ears 4 (four) times daily. 01/08/22     neomycin-polymyxin-hydrocortisone (CORTISPORIN) 3.5-10000-1 OTIC suspension Place 3 drops into the left ear 4 (four) times daily. 09/11/22     omeprazole (PRILOSEC) 40 MG capsule Take 1 capsule by mouth everyday before dinner 05/15/21   Drema Halon, MD  [facility] DELICA LANCETS 33G MISC Use to test blood sugar 2 times daily as instructed. 01/24/15   Carlus Pavlov, MD  polyethylene glycol (MIRALAX / GLYCOLAX) packet Take 17 g by mouth  daily. 10/30/18   Bing Neighbors, FNP  prednisoLONE acetate (PRED FORTE) 1 % ophthalmic suspension Instill 1 drop into right eye twice a day 11/18/21     Semaglutide, 1 MG/DOSE, (OZEMPIC, 1 MG/DOSE,) 4 MG/3ML SOPN Inject 1 mg under the skin once a week 11/18/21     triamcinolone (NASACORT) 55 MCG/ACT AERO nasal inhaler Place 2 squirts in each nostril at night before bedtime 06/28/22     [facility] LANCETS MISC Use 2x a day - Tru Track 06/09/14   Carlus Pavlov, MD    Family History Family History  Problem Relation Age of Onset   Hypertension Mother    Prostate cancer Father    Colon cancer Neg Hx    Colon polyps Neg Hx     Social History Social History   Tobacco Use   Smoking status: Never   Smokeless tobacco: Never  Vaping Use   Vaping Use: Never used  Substance Use Topics   Alcohol use: No   Drug use: No     Allergies   Patient has no known allergies.   Review of Systems Review of Systems  Constitutional:  Negative for fatigue and fever.  Gastrointestinal:  Negative for abdominal pain, nausea and vomiting.  Genitourinary:  Negative for dysuria, frequency, hematuria and urgency.  Musculoskeletal:  Positive for back pain.  Neurological:  Negative for dizziness and headaches.     Physical Exam Triage Vital Signs ED Triage Vitals  Enc Vitals Group     BP 10/29/22 1104 (!) 171/94     Pulse Rate 10/29/22 1104 83     Resp 10/29/22 1104 20     Temp 10/29/22 1104 98 F (36.7 C)     Temp src --      SpO2 10/29/22 1104 99 %     Weight 10/29/22 1056 235 lb (106.6 kg)     Height 10/29/22 1056 6\' 3"  (1.905 m)     Head Circumference --      Peak Flow --      Pain Score 10/29/22 1055 10     Pain Loc --      Pain Edu? --  Excl. in [place]? --    No data found.  Updated Vital Signs BP (!) 175/93 (BP Location: Left Arm)   Pulse 83   Temp 98 F (36.7 C)   Resp 20   Ht 6\' 3"  (1.905 m)   Wt 235 lb (106.6 kg)   SpO2 99%   BMI 29.37 kg/m   Visual Acuity Right  Eye Distance:   Left Eye Distance:   Bilateral Distance:    Right Eye Near:   Left Eye Near:    Bilateral Near:     Physical Exam Vitals and nursing note reviewed.  Constitutional:      General: He is not in acute distress. HENT:     Head: Normocephalic.  Cardiovascular:     Rate and Rhythm: Normal rate and regular rhythm.     Heart sounds: Normal heart sounds.  Pulmonary:     Effort: Pulmonary effort is normal.     Breath sounds: Normal breath sounds.  Abdominal:     Palpations: Abdomen is soft. There is no mass.     Tenderness: There is no abdominal tenderness. There is no right CVA tenderness, left CVA tenderness, guarding or rebound.  Musculoskeletal:     Lumbar back: Normal range of motion. Negative right straight leg raise test and negative left straight leg raise test.     Comments: Points to right CVA into lateral flank as area of pain - no tenderness.   Skin:    Findings: No rash.  Neurological:     Mental Status: He is alert.     Gait: Gait normal.  Psychiatric:        Mood and Affect: Mood normal.      UC Treatments / Results  Labs (all labs ordered are listed, but only abnormal results are displayed) Labs Reviewed  POCT URINALYSIS DIP (MANUAL ENTRY) - Abnormal; Notable for the following components:      Result Value   Color, UA straw (*)    Glucose, UA =500 (*)    Blood, UA trace-intact (*)    Protein Ur, POC =100 (*)    All other components within normal limits    EKG   Radiology No results found.  Procedures Procedures (including critical care time)  Medications Ordered in UC Medications  ketorolac (TORADOL) 30 MG/ML injection 30 mg (has no administration in time range)    Initial Impression / Assessment and Plan / UC Course  I have reviewed the triage vital signs and the nursing notes.  Pertinent labs & imaging results that were available during my care of the patient were reviewed by me and considered in my medical decision making  (see chart for details).     Sx most consistent with renal colic due to nephrolithiasis - pt hx of stone and similar pain, sharp intermittent pain not connected with movement, no tenderness limits suspicion of msk cause. No urinary or red flag sx. Sx tx w NSAID and Flomax, discussed expectant mgmt and follow-up with PCP or urology. Discussed ER precautions.   E/M: 1 acute uncomplicated illness, 1 data, moderate risk due to prescription management  Final Clinical Impressions(s) / UC Diagnoses   Final diagnoses:  Renal colic on right side     Discharge Instructions      Toradol injection given today to help with pain.  Take diclofenac as prescribed starting this evening to help with pain. Can also take Tylenol, but do not take ibuprofen or naproxen with the diclofenac. Take the Flomax  and push fluids to help pass the kidney stone.  Go to the ER if severe pain that does not go away or develop new concerning symptoms such as fever, vomiting, or inability to urinate.     ED Prescriptions     Medication Sig Dispense Auth. Provider   diclofenac (VOLTAREN) 50 MG EC tablet Take 1 tablet (50 mg total) by mouth 2 (two) times daily. 10 tablet Vallery Sa, Krithik Mapel L, PA   tamsulosin (FLOMAX) 0.4 MG CAPS capsule Take 1 capsule (0.4 mg total) by mouth daily. 30 capsule Vallery Sa, Aarvi Stotts L, PA      PDMP not reviewed this encounter.   Estanislado Pandy, [place] 10/29/22 1135

## 2022-10-29 NOTE — Discharge Instructions (Addendum)
Toradol injection given today to help with pain.  Take diclofenac as prescribed starting this evening to help with pain. Can also take Tylenol, but do not take ibuprofen or naproxen with the diclofenac. Take the Flomax and push fluids to help pass the kidney stone.  Go to the ER if severe pain that does not go away or develop new concerning symptoms such as fever, vomiting, or inability to urinate.

## 2022-10-29 NOTE — ED Triage Notes (Addendum)
Pt presents to Urgent Care with c/o R sided back pain, mid to lower region, x 36 hours. Also states he has felt feverish for a few days. Denies injury or urinary s/s.

## 2022-10-30 ENCOUNTER — Other Ambulatory Visit (HOSPITAL_BASED_OUTPATIENT_CLINIC_OR_DEPARTMENT_OTHER): Payer: Self-pay

## 2022-11-04 ENCOUNTER — Other Ambulatory Visit (HOSPITAL_BASED_OUTPATIENT_CLINIC_OR_DEPARTMENT_OTHER): Payer: Self-pay

## 2022-11-20 ENCOUNTER — Other Ambulatory Visit (HOSPITAL_BASED_OUTPATIENT_CLINIC_OR_DEPARTMENT_OTHER): Payer: Self-pay

## 2023-01-22 ENCOUNTER — Ambulatory Visit
Admission: EM | Admit: 2023-01-22 | Discharge: 2023-01-22 | Disposition: A | Discharge status: Home / Self Care | Payer: Commercial Managed Care - PPO | Attending: Emergency Medicine | Admitting: Emergency Medicine

## 2023-01-22 ENCOUNTER — Other Ambulatory Visit (HOSPITAL_BASED_OUTPATIENT_CLINIC_OR_DEPARTMENT_OTHER): Payer: Self-pay

## 2023-01-22 DIAGNOSIS — M940 Chondrocostal junction syndrome [Tietze]: Secondary | ICD-10-CM | POA: Diagnosis not present

## 2023-01-22 DIAGNOSIS — R051 Acute cough: Secondary | ICD-10-CM

## 2023-01-22 MED ORDER — GUAIFENESIN 100 MG/5ML PO LIQD
10.0000 mL | ORAL | 0 refills | Status: DC | PRN
  Filled 2023-01-22: qty 237, 4d supply, fill #0

## 2023-01-22 MED ORDER — CYCLOBENZAPRINE HCL 10 MG PO TABS
10.0000 mg | ORAL_TABLET | Freq: Two times a day (BID) | ORAL | 0 refills | Status: DC | PRN
  Filled 2023-01-22: qty 20, 10d supply, fill #0

## 2023-01-22 MED ORDER — IBUPROFEN 600 MG PO TABS
600.0000 mg | ORAL_TABLET | Freq: Four times a day (QID) | ORAL | 0 refills | Status: DC | PRN
  Filled 2023-01-22: qty 30, 8d supply, fill #0

## 2023-01-22 NOTE — ED Triage Notes (Signed)
Pt c/o cough x 3 days. Pt c/o pain in lower sternum area as well as both sides of ribs radiating to back. Advil prn.

## 2023-01-22 NOTE — ED Provider Notes (Signed)
[facility]    CSN: [id number] Arrival date & time: 01/22/23  1220     History   Chief Complaint Chief Complaint  Patient presents with   Generalized Body Aches    Sternum and ribs    HPI Jamie Foster is a 57 y.o. male.  Here with 3 day history of cough Cough is causing discomfort in the ribs and chest Today 9/10 Has tried ibuprofen a few times No cough meds No fevers  Reports he has not seen a PCP in 3-4 years  Past Medical History:  Diagnosis Date   Allergy    Diabetes mellitus without complication ([place])    H pylori ulcer    Hyperlipidemia    Hypertension    Kidney stone    Sleep apnea     Patient Active Problem List   Diagnosis Date Noted   Uncontrolled type 2 diabetes mellitus with hyperglycemia ([place]) 08/20/2020   Hyperlipidemia 01/23/2019   Low back pain without sciatica 01/23/2019   Uncontrolled type 2 diabetes mellitus with retinopathy, without long-term current use of insulin 12/21/2015   Family hx of prostate cancer 06/22/2013   OSA (obstructive sleep apnea) 10/06/2012   BMI 30.0-30.9,adult 07/19/2012   HTN (hypertension) 07/19/2012   GERD (gastroesophageal reflux disease) 07/19/2012   DDD (degenerative disc disease), cervical 07/19/2012    Past Surgical History:  Procedure Laterality Date   NASAL SINUS SURGERY         Home Medications    Prior to Admission medications   Medication Sig Start Date End Date Taking? Authorizing Provider  cyclobenzaprine (FLEXERIL) 10 MG tablet Take 1 tablet (10 mg total) by mouth 2 (two) times daily as needed for muscle spasms. 01/22/23  Yes Zygmund Passero, Wells Guiles, PA-C  guaiFENesin (ROBITUSSIN) 100 MG/5ML liquid Take 10 mLs by mouth every 4 (four) hours as needed for cough or to loosen phlegm. 01/22/23  Yes Christhoper Busbee, Wells Guiles, PA-C  ibuprofen (ADVIL) 600 MG tablet Take 1 tablet (600 mg total) by mouth every 6 (six) hours as needed. 01/22/23  Yes Haile Bosler, Wells Guiles, PA-C  atorvastatin (LIPITOR) 10 MG tablet Take  1 tablet by mouth every morning 06/27/22     Continuous Blood Gluc Sensor (FREESTYLE LIBRE 2 SENSOR) MISC Apply 1 sensor once every 14 days 09/13/22     dapagliflozin propanediol (FARXIGA) 10 MG TABS tablet Take 1 tablet by mouth every morning 06/27/22     Fluocinolone Acetonide 0.01 % OIL Place 4 drops into the left ear 2 (two) times daily. 09/17/22     glucose blood (TRUETRACK TEST) test strip Use 2x a day 05/17/18   Wendie Agreste, MD  Insulin Glargine (BASAGLAR KWIKPEN) 100 UNIT/ML INJECT 0.18 MLS (18 UNITS TOTAL) INTO THE SKIN DAILY. 06/11/20 06/11/21  Shelda Pal, DO  insulin glargine (LANTUS SOLOSTAR) 100 UNIT/ML Solostar Pen Inject 18 Units into the skin at bedtime. 08/20/20   Shelda Pal, DO  insulin glargine (LANTUS) 100 UNIT/ML Solostar Pen Inject 18 untis into the skin at bedtime 02/28/22     Insulin Pen Needle 32G X 4 MM MISC Use 1x a day 01/20/19   Philemon Kingdom, MD  metFORMIN (GLUCOPHAGE-XR) 500 MG 24 hr tablet Take 2 tablets (1,000 mg total) by mouth 2 (two) times daily with a meal. 07/11/22     omeprazole (PRILOSEC) 40 MG capsule Take 1 capsule by mouth everyday before dinner 05/15/21   Rozetta Nunnery, MD  [facility] DELICA LANCETS [id number] MISC Use to test blood sugar 2 times  daily as instructed. 01/24/15   Philemon Kingdom, MD  Semaglutide, 1 MG/DOSE, (OZEMPIC, 1 MG/DOSE,) 4 MG/3ML SOPN Inject 1 mg under the skin once a week 11/18/21     tamsulosin (FLOMAX) 0.4 MG CAPS capsule Take 1 capsule (0.4 mg total) by mouth daily. 10/29/22   Abner Greenspan, Amy L, PA  triamcinolone (NASACORT) 55 MCG/ACT AERO nasal inhaler Place 2 squirts in each nostril at night before bedtime 06/28/22     [facility] LANCETS MISC Use 2x a day - Tru Track 06/09/14   Philemon Kingdom, MD    Family History Family History  Problem Relation Age of Onset   Hypertension Mother    Prostate cancer Father    Colon cancer Neg Hx    Colon polyps Neg Hx     Social History Social History   Tobacco Use    Smoking status: Never   Smokeless tobacco: Never  Vaping Use   Vaping Use: Never used  Substance Use Topics   Alcohol use: No   Drug use: No     Allergies   Patient has no known allergies.   Review of Systems Review of Systems As per HPI  Physical Exam Triage Vital Signs ED Triage Vitals  Enc Vitals Group     BP --      Pulse Rate 01/22/23 1234 88     Resp 01/22/23 1234 17     Temp 01/22/23 1234 97.7 F (36.5 C)     Temp Source 01/22/23 1234 Oral     SpO2 01/22/23 1234 98 %     Weight --      Height --      Head Circumference --      Peak Flow --      Pain Score 01/22/23 1235 9     Pain Loc --      Pain Edu? --      Excl. in [place]? --    No data found.  Updated Vital Signs BP (!) 169/107 (BP Location: Left Arm)   Pulse 88   Temp 97.7 F (36.5 C) (Oral)   Resp 17   SpO2 98%   Visual Acuity Right Eye Distance:   Left Eye Distance:   Bilateral Distance:    Right Eye Near:   Left Eye Near:    Bilateral Near:     Physical Exam Vitals and nursing note reviewed.  HENT:     Right Ear: Tympanic membrane and ear canal normal.     Left Ear: Tympanic membrane and ear canal normal.     Nose: No congestion or rhinorrhea.     Mouth/Throat:     Mouth: Mucous membranes are moist.     Pharynx: Oropharynx is clear.  Eyes:     Conjunctiva/sclera: Conjunctivae normal.  Cardiovascular:     Rate and Rhythm: Normal rate and regular rhythm.     Pulses: Normal pulses.     Heart sounds: Normal heart sounds.  Pulmonary:     Effort: Pulmonary effort is normal.     Breath sounds: Normal breath sounds.  Chest:     Chest wall: Tenderness present.     Comments: A little tender at base of sternum  Musculoskeletal:        General: Normal range of motion.     Cervical back: Normal range of motion. No rigidity or tenderness.  Skin:    General: Skin is warm and dry.  Neurological:     Mental Status: He is alert and oriented to  person, place, and time.      UC  Treatments / Results  Labs (all labs ordered are listed, but only abnormal results are displayed) Labs Reviewed - No data to display  EKG   Radiology No results found.  Procedures Procedures (including critical care time)  Medications Ordered in UC Medications - No data to display  Initial Impression / Assessment and Plan / UC Course  I have reviewed the triage vital signs and the nursing notes.  Pertinent labs & imaging results that were available during my care of the patient were reviewed by me and considered in my medical decision making (see chart for details).  Recommend ibuprofen 3x daily, robitussin, can try muscle relaxer BID Hot pad/hot bath Added to PCP assistance list - recommend follow up as soon as able. Return precautions discussed. Patient agrees to plan  Final Clinical Impressions(s) / UC Diagnoses   Final diagnoses:  Acute cough  Costochondritis     Discharge Instructions      Please take the medications as prescribed Flexeril twice daily Ibuprofen three times daily Robutissin every 4 hours  Please establish with a primary care provider as soon as able for follow up     ED Prescriptions     Medication Sig Dispense Auth. Provider   ibuprofen (ADVIL) 600 MG tablet Take 1 tablet (600 mg total) by mouth every 6 (six) hours as needed. 30 tablet Jonasia Coiner, PA-C   cyclobenzaprine (FLEXERIL) 10 MG tablet Take 1 tablet (10 mg total) by mouth 2 (two) times daily as needed for muscle spasms. 20 tablet Tangela Dolliver, PA-C   guaiFENesin (ROBITUSSIN) 100 MG/5ML liquid Take 10 mLs by mouth every 4 (four) hours as needed for cough or to loosen phlegm. 236 mL Brittain Smithey, Wells Guiles, PA-C      PDMP not reviewed this encounter.   Kaisa Wofford, Wells Guiles, [place] 01/22/23 1306

## 2023-01-22 NOTE — Discharge Instructions (Addendum)
Please take the medications as prescribed Flexeril twice daily Ibuprofen three times daily Robutissin every 4 hours  Please establish with a primary care provider as soon as able for follow up

## 2023-01-27 ENCOUNTER — Encounter: Payer: Self-pay | Admitting: Family Medicine

## 2023-01-27 ENCOUNTER — Other Ambulatory Visit: Payer: Self-pay | Admitting: Family Medicine

## 2023-01-27 ENCOUNTER — Other Ambulatory Visit: Payer: Self-pay

## 2023-01-27 ENCOUNTER — Ambulatory Visit: Payer: Commercial Managed Care - PPO | Admitting: Family Medicine

## 2023-01-27 ENCOUNTER — Other Ambulatory Visit (HOSPITAL_BASED_OUTPATIENT_CLINIC_OR_DEPARTMENT_OTHER): Payer: Self-pay

## 2023-01-27 ENCOUNTER — Telehealth: Payer: Self-pay | Admitting: Family Medicine

## 2023-01-27 VITALS — BP 174/110 | HR 92 | Temp 97.7°F | Ht 75.0 in | Wt 221.6 lb

## 2023-01-27 DIAGNOSIS — M4722 Other spondylosis with radiculopathy, cervical region: Secondary | ICD-10-CM | POA: Diagnosis not present

## 2023-01-27 DIAGNOSIS — E1165 Type 2 diabetes mellitus with hyperglycemia: Secondary | ICD-10-CM | POA: Diagnosis not present

## 2023-01-27 DIAGNOSIS — E113293 Type 2 diabetes mellitus with mild nonproliferative diabetic retinopathy without macular edema, bilateral: Secondary | ICD-10-CM

## 2023-01-27 DIAGNOSIS — I10 Essential (primary) hypertension: Secondary | ICD-10-CM | POA: Diagnosis not present

## 2023-01-27 DIAGNOSIS — M5030 Other cervical disc degeneration, unspecified cervical region: Secondary | ICD-10-CM

## 2023-01-27 DIAGNOSIS — Z7689 Persons encountering health services in other specified circumstances: Secondary | ICD-10-CM

## 2023-01-27 LAB — POCT GLYCOSYLATED HEMOGLOBIN (HGB A1C): Hemoglobin A1C: 8.6 % — AB (ref 4.0–5.6)

## 2023-01-27 MED ORDER — DAPAGLIFLOZIN PROPANEDIOL 10 MG PO TABS
10.0000 mg | ORAL_TABLET | Freq: Every morning | ORAL | 1 refills | Status: DC
  Filled 2023-01-27: qty 90, 90d supply, fill #0
  Filled 2023-03-02: qty 30, 30d supply, fill #0
  Filled 2023-03-02: qty 90, 90d supply, fill #0
  Filled 2023-04-07 (×2): qty 30, 30d supply, fill #1
  Filled 2023-05-12: qty 90, 90d supply, fill #2
  Filled 2023-07-22: qty 30, 30d supply, fill #3

## 2023-01-27 MED ORDER — METFORMIN HCL ER 500 MG PO TB24
500.0000 mg | ORAL_TABLET | Freq: Two times a day (BID) | ORAL | 1 refills | Status: DC
  Filled 2023-01-27: qty 180, 90d supply, fill #0

## 2023-01-27 MED ORDER — LISINOPRIL 30 MG PO TABS
30.0000 mg | ORAL_TABLET | Freq: Every day | ORAL | 1 refills | Status: DC
  Filled 2023-01-27: qty 90, 90d supply, fill #0

## 2023-01-27 NOTE — Progress Notes (Signed)
New Patient Office Visit  Subjective    Patient ID: Jamie Foster, male    DOB: 03/23/1966  Age: 57 y.o. MRN: [id number]  CC:  Chief Complaint  Patient presents with   Establish Care    HPI Jamie Foster presents to establish care.   Neck pain Pt reports symptoms of left sided neck pain. Present for many years since 2002 while he was living in [place]. Hurts worse to rotate to the left. He was seen for this at an urgent care in 2007. He had ct scan done that showed   Disk space narrowing at C5-6 associated with osteophyte formation, bilateral uncinate hypertrophy and central bulging annular fibers; bilateral nerve root encroachment is present, left greater than right.  2.   Small central protrusion C3-4 with focal indentation of the spinal cord, but there is no significant C4 nerve root encroachment.  He reports for the pain he has tried advil. He also reports left pinky finger and 4th finger numbness at time. He also reports different temperature sensations on bilateral hand/numb. He also has weakness in his hands but not dropping things. He says his right hand is weaker.   Flank pain Pt presents sharp rib pain bilaterally. Comes and goes. Worse with twisting. He had exacerbations of this for the last year. He takes Advil for this.   Right leg pain He reports sometimes his right leg gives out. Been present for the last year. He says it feels like it went to sleep.  Diabetes Use to be on Metformin. He reports it makes his muscles are weak all over.  He also stopped Atorvastatin at the same time, not knowing which one caused his symptoms.  He only does Metformin every other day now due to sugars are high.  He is taking [place] '10mg'$  daily in the morning. He reports dry mouth and having to drink. Pt can't eat a lot of rice, he sees it makes his sugars go up.  Up to date with diabetic eye exam. Had done in Sept 2023 showed bilateral nonproliferative without macular edema diabetic  retinopathy.  Hypertension He is only taking Lisinopril 10 mg daily. Pt reports stopping a lot of his medicines since December 2023.    Outpatient Encounter Medications as of 01/27/2023  Medication Sig   Continuous Blood Gluc Sensor (FREESTYLE LIBRE 2 SENSOR) MISC Apply 1 sensor once every 14 days   Insulin Pen Needle 32G X 4 MM MISC Use 1x a day   lisinopril (ZESTRIL) 30 MG tablet Take 1 tablet (30 mg total) by mouth daily.   ONETOUCH DELICA LANCETS 99991111 MISC Use to test blood sugar 2 times daily as instructed.   triamcinolone (NASACORT) 55 MCG/ACT AERO nasal inhaler Place 2 squirts in each nostril at night before bedtime   [place] LANCETS MISC Use 2x a day - Tru Track   [DISCONTINUED] cyclobenzaprine (FLEXERIL) 10 MG tablet Take 1 tablet (10 mg total) by mouth 2 (two) times daily as needed for muscle spasms.   [DISCONTINUED] dapagliflozin propanediol (FARXIGA) 10 MG TABS tablet Take 1 tablet by mouth every morning   [DISCONTINUED] insulin glargine (LANTUS) 100 UNIT/ML Solostar Pen Inject 18 untis into the skin at bedtime   dapagliflozin propanediol (FARXIGA) 10 MG TABS tablet Take 1 tablet by mouth every morning   glucose blood (TRUETRACK TEST) test strip Use 2x a day (Patient not taking: Reported on 01/27/2023)   metFORMIN (GLUCOPHAGE-XR) 500 MG 24 hr tablet Take 1 tablet (500 mg total)  by mouth 2 (two) times daily with a meal.   [DISCONTINUED] atorvastatin (LIPITOR) 10 MG tablet Take 1 tablet by mouth every morning (Patient not taking: Reported on 01/27/2023)   [DISCONTINUED] Fluocinolone Acetonide 0.01 % OIL Place 4 drops into the left ear 2 (two) times daily.   [DISCONTINUED] guaiFENesin (ROBITUSSIN) 100 MG/5ML liquid Take 10 mLs by mouth every 4 (four) hours as needed for cough or to loosen phlegm.   [DISCONTINUED] ibuprofen (ADVIL) 600 MG tablet Take 1 tablet (600 mg total) by mouth every 6 (six) hours as needed.   [DISCONTINUED] Insulin Glargine (BASAGLAR KWIKPEN) 100 UNIT/ML INJECT 0.18  MLS (18 UNITS TOTAL) INTO THE SKIN DAILY.   [DISCONTINUED] insulin glargine (LANTUS SOLOSTAR) 100 UNIT/ML Solostar Pen Inject 18 Units into the skin at bedtime.   [DISCONTINUED] metFORMIN (GLUCOPHAGE-XR) 500 MG 24 hr tablet Take 2 tablets (1,000 mg total) by mouth 2 (two) times daily with a meal. (Patient not taking: Reported on 01/27/2023)   [DISCONTINUED] omeprazole (PRILOSEC) 40 MG capsule Take 1 capsule by mouth everyday before dinner (Patient not taking: Reported on 01/27/2023)   [DISCONTINUED] Semaglutide, 1 MG/DOSE, (OZEMPIC, 1 MG/DOSE,) 4 MG/3ML SOPN Inject 1 mg under the skin once a week   [DISCONTINUED] tamsulosin (FLOMAX) 0.4 MG CAPS capsule Take 1 capsule (0.4 mg total) by mouth daily. (Patient not taking: Reported on 01/27/2023)   No facility-administered encounter medications on file as of 01/27/2023.    Past Medical History:  Diagnosis Date   Allergy    Diabetes mellitus without complication ([place])    H pylori ulcer    Hyperlipidemia    Hypertension    Kidney stone    Sleep apnea     Past Surgical History:  Procedure Laterality Date   NASAL SINUS SURGERY      Family History  Problem Relation Age of Onset   Hypertension Mother    Prostate cancer Father    Colon cancer Neg Hx    Colon polyps Neg Hx     Social History   Socioeconomic History   Marital status: Married    Spouse name: Not on file   Number of children: Not on file   Years of education: Not on file   Highest education level: Not on file  Occupational History   Not on file  Tobacco Use   Smoking status: Never   Smokeless tobacco: Never  Vaping Use   Vaping Use: Never used  Substance and Sexual Activity   Alcohol use: No   Drug use: No   Sexual activity: Not on file  Other Topics Concern   Not on file  Social History Narrative   Married.Education: College/Other. Exercise: Walks.   From [place]   57 y.o, 59 yo, 59 y.o 59 y.o all girls   Social Determinants of Systems developer Strain: Not on file  Food Insecurity: Not on file  Transportation Needs: Not on file  Physical Activity: Not on file  Stress: Not on file  Social Connections: Not on file  Intimate Partner Violence: Not on file    Review of Systems  HENT:         Dry mouth  Gastrointestinal:        Flank pain  Genitourinary:  Positive for frequency.  Musculoskeletal:  Positive for neck pain.  Neurological:  Positive for tingling, sensory change and focal weakness.  All other systems reviewed and are negative.       Objective    BP Marland Kitchen)  178/113 (BP Location: Left Arm, Patient Position: Sitting, Cuff Size: Large)   Pulse 92   Temp 97.7 F (36.5 C) (Oral)   Ht '6\' 3"'$  (1.905 m)   Wt 221 lb 9.6 oz (100.5 kg)   SpO2 99%   BMI 27.70 kg/m   Physical Exam Vitals and nursing note reviewed.  Constitutional:      Appearance: Normal appearance. He is normal weight.  HENT:     Head: Normocephalic and atraumatic.     Right Ear: External ear normal.     Left Ear: External ear normal.     Nose: Nose normal.     Mouth/Throat:     Mouth: Mucous membranes are moist.  Eyes:     Extraocular Movements: Extraocular movements intact.  Neck:     Comments: Diminished ROM of cervical spine to left Cardiovascular:     Rate and Rhythm: Normal rate and regular rhythm.     Pulses: Normal pulses.     Heart sounds: Normal heart sounds.  Pulmonary:     Effort: Pulmonary effort is normal.     Breath sounds: Normal breath sounds.  Abdominal:     General: Abdomen is flat. Bowel sounds are normal.  Musculoskeletal:        General: Normal range of motion.  Skin:    General: Skin is warm.     Capillary Refill: Capillary refill takes less than 2 seconds.  Neurological:     General: No focal deficit present.     Mental Status: He is alert and oriented to person, place, and time. Mental status is at baseline.  Psychiatric:        Mood and Affect: Mood normal.        Behavior: Behavior normal.         Thought Content: Thought content normal.        Judgment: Judgment normal.        Assessment & Plan:   Problem List Items Addressed This Visit     Encounter to establish care with new doctor  Uncontrolled type 2 diabetes mellitus with hyperglycemia ([place]) -     POCT glycosylated hemoglobin (Hb A1C) -     Lisinopril; Take 1 tablet (30 mg total) by mouth daily.  Dispense: 90 tablet; Refill: 1 -     metFORMIN HCl ER; Take 1 tablet (500 mg total) by mouth 2 (two) times daily with a meal.  Dispense: 180 tablet; Refill: 1 -     Microalbumin / creatinine urine ratio  -uncontrolled and worsening. To get back on chronic condition regimen. Pt has been non-adherent to medicines in the past, due to side effects it sounds like. I explained to him the importance of compliance with diabetic regimen to avoid complications such as diabetic retinopathy, eye blindness, kidney disease/failure, strokes and/or heart attacks to name a few. Pt says he isn't opposed to starting back medicines. He was on a lot and had side effects. As a result he stopped everything to figure out which one was causing his symptoms. Lab Results  Component Value Date   HGBA1C 8.6 (A) 01/27/2023   Will add back Metformin '500mg'$  BID and to continue Farxiga '10mg'$  daily. Continue checking sugars and he's shown me log today.  DDD (degenerative disc disease), cervical -     MR CERVICAL SPINE WO CONTRAST; Future  -reviewed old Ct scan showing cervical disease. Likely the cause of his symptoms now that has worsened with more frequent radicular symptoms. Send for MRI of  cervical spine.   Radiculopathy due to cervical spondylosis -     MR CERVICAL SPINE WO CONTRAST; Future  Uncontrolled hypertension -     Lisinopril; Take 1 tablet (30 mg total) by mouth daily.  Dispense: 90 tablet; Refill: 1   -uncontrolled and worsening. Start back Lisinopril but at higher dosage of 30 mg. TO monitor readings outside the office. See back in 4 weeks for  follow up and physical.    Return in about 4 weeks (around 02/24/2023) for Annual Physical.   Leeanne Rio, MD  Total time spent with patient today 63 minutes. This includes reviewing records, evaluating the patient and coordinating care. Face-to-face time >50%.

## 2023-01-27 NOTE — Telephone Encounter (Signed)
Patient requested to have prostate testing done while checking out, states he did not get a chance to mention it at his appointment. Jamie Foster

## 2023-01-27 NOTE — Telephone Encounter (Signed)
Should this be done at his next appointment (CPE)?

## 2023-01-27 NOTE — Telephone Encounter (Signed)
Lvm to make pt aware.

## 2023-01-28 LAB — MICROALBUMIN / CREATININE URINE RATIO
Creatinine, Urine: 88.7 mg/dL
Microalb/Creat Ratio: 84 mg/g creat — ABNORMAL HIGH (ref 0–29)
Microalbumin, Urine: 74.5 ug/mL

## 2023-02-01 ENCOUNTER — Other Ambulatory Visit: Payer: Commercial Managed Care - PPO

## 2023-02-03 ENCOUNTER — Other Ambulatory Visit (HOSPITAL_BASED_OUTPATIENT_CLINIC_OR_DEPARTMENT_OTHER): Payer: Self-pay

## 2023-02-10 ENCOUNTER — Ambulatory Visit (INDEPENDENT_AMBULATORY_CARE_PROVIDER_SITE_OTHER): Payer: Commercial Managed Care - PPO

## 2023-02-10 ENCOUNTER — Other Ambulatory Visit (HOSPITAL_BASED_OUTPATIENT_CLINIC_OR_DEPARTMENT_OTHER): Payer: Self-pay

## 2023-02-10 ENCOUNTER — Other Ambulatory Visit: Payer: Self-pay | Admitting: Family Medicine

## 2023-02-10 DIAGNOSIS — M5136 Other intervertebral disc degeneration, lumbar region: Secondary | ICD-10-CM

## 2023-02-10 DIAGNOSIS — M5030 Other cervical disc degeneration, unspecified cervical region: Secondary | ICD-10-CM | POA: Diagnosis not present

## 2023-02-10 DIAGNOSIS — M545 Low back pain, unspecified: Secondary | ICD-10-CM | POA: Diagnosis not present

## 2023-02-10 DIAGNOSIS — M4312 Spondylolisthesis, cervical region: Secondary | ICD-10-CM | POA: Diagnosis not present

## 2023-02-10 DIAGNOSIS — M47816 Spondylosis without myelopathy or radiculopathy, lumbar region: Secondary | ICD-10-CM | POA: Diagnosis not present

## 2023-02-10 DIAGNOSIS — M4722 Other spondylosis with radiculopathy, cervical region: Secondary | ICD-10-CM

## 2023-02-10 DIAGNOSIS — M4316 Spondylolisthesis, lumbar region: Secondary | ICD-10-CM | POA: Diagnosis not present

## 2023-02-18 ENCOUNTER — Telehealth: Payer: Self-pay

## 2023-02-18 NOTE — Telephone Encounter (Signed)
Patient will be in office tomorrow for an appointment

## 2023-02-19 ENCOUNTER — Encounter: Payer: Self-pay | Admitting: Family Medicine

## 2023-02-19 ENCOUNTER — Ambulatory Visit (INDEPENDENT_AMBULATORY_CARE_PROVIDER_SITE_OTHER): Payer: Commercial Managed Care - PPO | Admitting: Family Medicine

## 2023-02-19 ENCOUNTER — Other Ambulatory Visit: Payer: Self-pay

## 2023-02-19 ENCOUNTER — Other Ambulatory Visit (HOSPITAL_BASED_OUTPATIENT_CLINIC_OR_DEPARTMENT_OTHER): Payer: Self-pay

## 2023-02-19 VITALS — BP 128/78 | HR 83 | Temp 97.7°F | Resp 18 | Ht 75.0 in | Wt 225.2 lb

## 2023-02-19 DIAGNOSIS — I10 Essential (primary) hypertension: Secondary | ICD-10-CM

## 2023-02-19 DIAGNOSIS — Z125 Encounter for screening for malignant neoplasm of prostate: Secondary | ICD-10-CM

## 2023-02-19 DIAGNOSIS — E1165 Type 2 diabetes mellitus with hyperglycemia: Secondary | ICD-10-CM | POA: Diagnosis not present

## 2023-02-19 DIAGNOSIS — R5382 Chronic fatigue, unspecified: Secondary | ICD-10-CM

## 2023-02-19 DIAGNOSIS — M4802 Spinal stenosis, cervical region: Secondary | ICD-10-CM

## 2023-02-19 DIAGNOSIS — Z0000 Encounter for general adult medical examination without abnormal findings: Secondary | ICD-10-CM

## 2023-02-19 DIAGNOSIS — Z136 Encounter for screening for cardiovascular disorders: Secondary | ICD-10-CM | POA: Diagnosis not present

## 2023-02-19 DIAGNOSIS — Z13220 Encounter for screening for lipoid disorders: Secondary | ICD-10-CM | POA: Diagnosis not present

## 2023-02-19 DIAGNOSIS — M48061 Spinal stenosis, lumbar region without neurogenic claudication: Secondary | ICD-10-CM

## 2023-02-19 DIAGNOSIS — Z1211 Encounter for screening for malignant neoplasm of colon: Secondary | ICD-10-CM

## 2023-02-19 MED ORDER — LOSARTAN POTASSIUM 25 MG PO TABS
25.0000 mg | ORAL_TABLET | Freq: Every day | ORAL | 1 refills | Status: DC
  Filled 2023-02-19: qty 90, 90d supply, fill #0

## 2023-02-19 MED ORDER — GLUCOTROL XL 5 MG PO TB24
5.0000 mg | ORAL_TABLET | Freq: Two times a day (BID) | ORAL | 1 refills | Status: DC
  Filled 2023-02-19: qty 60, 30d supply, fill #0
  Filled 2023-03-17: qty 60, 30d supply, fill #1

## 2023-02-19 NOTE — Progress Notes (Signed)
Complete physical exam  Patient: Jamie Foster   DOB: [birth year]   57 y.o. Male  MRN: [id number]  Subjective:    Chief Complaint  Patient presents with   Annual Exam    Jamie Foster is a 57 y.o. male who presents today for a complete physical exam. He reports consuming a diabetic diet. The patient does not participate in regular exercise at present. He generally feels fatigue. He reports sleeping fairly well. He does have additional problems to discuss today.  Pt has continuous glucose monitor. Averaging 130-300 average 200. He had to use Ozempic 1 ml last week along with Lantus 18 units that he had high sugars.  When he laughs, he has dry cough. He is on Lisinopril for diabetic prevention. He would like to come off of this. He has abdominal pain midepigastric. Thinks it's from Metformin. Would like to try another medicine.   Most recent fall risk assessment:    01/27/2023   11:39 AM  Fall Risk   Falls in the past year? 1  Number falls in past yr: 1  Comment Pt states that he falls on his job frequently.  Injury with Fall? 1  Risk for fall due to : No Fall Risks;Other (Comment)  Follow up Falls evaluation completed     Most recent depression screenings:    01/27/2023   11:40 AM 08/20/2020    7:55 AM  PHQ 2/9 Scores  PHQ - 2 Score 0 0  PHQ- 9 Score 0     Vision:Within last year    Patient Care Team: Leeanne Rio, MD as PCP - General (Family Medicine)   Outpatient Medications Prior to Visit  Medication Sig   Continuous Blood Gluc Sensor (FREESTYLE LIBRE 2 SENSOR) MISC Apply 1 sensor once every 14 days   dapagliflozin propanediol (FARXIGA) 10 MG TABS tablet Take 1 tablet by mouth every morning   glucose blood (TRUETRACK TEST) test strip Use 2x a day   Insulin Pen Needle 32G X 4 MM MISC Use 1x a day   lisinopril (ZESTRIL) 30 MG tablet Take 1 tablet (30 mg total) by mouth daily.   metFORMIN (GLUCOPHAGE-XR) 500 MG 24 hr tablet Take 1 tablet (500 mg total) by  mouth 2 (two) times daily with a meal.   ONETOUCH DELICA LANCETS 99991111 MISC Use to test blood sugar 2 times daily as instructed.   triamcinolone (NASACORT) 55 MCG/ACT AERO nasal inhaler Place 2 squirts in each nostril at night before bedtime   [place] LANCETS MISC Use 2x a day - Tru Track   No facility-administered medications prior to visit.    Review of Systems  Constitutional:  Positive for malaise/fatigue.  Respiratory:  Positive for cough.   Gastrointestinal:  Positive for abdominal pain.  Musculoskeletal:  Positive for back pain and joint pain.       Right leg gives out and weakness  All other systems reviewed and are negative.        Objective:     BP 128/78   Pulse 83   Temp 97.7 F (36.5 C) (Oral)   Resp 18   Ht 6\' 3"  (1.905 m)   Wt 225 lb 3.2 oz (102.2 kg)   SpO2 98%   BMI 28.15 kg/m    Physical Exam Vitals and nursing note reviewed.  Constitutional:      Appearance: Normal appearance. He is normal weight.  HENT:     Head: Normocephalic and atraumatic.     Right Ear:  Tympanic membrane, ear canal and external ear normal.     Left Ear: Tympanic membrane, ear canal and external ear normal.     Nose: Nose normal.     Mouth/Throat:     Mouth: Mucous membranes are moist.     Pharynx: Oropharynx is clear.  Eyes:     Conjunctiva/sclera: Conjunctivae normal.     Pupils: Pupils are equal, round, and reactive to light.  Neck:     Comments: Diminished ROM to left > right Cardiovascular:     Rate and Rhythm: Normal rate and regular rhythm.     Pulses: Normal pulses.  Pulmonary:     Effort: Pulmonary effort is normal.     Breath sounds: Normal breath sounds.  Abdominal:     General: Abdomen is flat. Bowel sounds are normal.  Musculoskeletal:        General: Normal range of motion.  Skin:    General: Skin is warm.     Capillary Refill: Capillary refill takes less than 2 seconds.  Neurological:     General: No focal deficit present.     Mental Status: He  is alert and oriented to person, place, and time. Mental status is at baseline.  Psychiatric:        Mood and Affect: Mood normal.        Behavior: Behavior normal.        Thought Content: Thought content normal.        Judgment: Judgment normal.     No results found for any visits on 02/19/23.     Assessment & Plan:    Routine Health Maintenance and Physical Exam  Immunization History  Administered Date(s) Administered   COVID-19, mRNA, vaccine(Comirnaty)12 years and older 09/11/2022   PFIZER Comirnaty(Gray Top)Covid-19 Tri-Sucrose Vaccine 05/02/2021   PFIZER(Purple Top)SARS-COV-2 Vaccination 02/20/2020, 03/12/2020, 09/10/2020   Pfizer Covid-19 Vaccine Bivalent Booster 53yrs & up 10/09/2021   Pneumococcal Conjugate-13 01/30/2016   Pneumococcal Polysaccharide-23 05/03/2018   Tdap 01/30/2016   Zoster Recombinat (Shingrix) 04/13/2020, 06/07/2020    Health Maintenance  Topic Date Due   Diabetic kidney evaluation - eGFR measurement  02/20/2021   FOOT EXAM  02/20/2021   HEMOGLOBIN A1C  07/30/2023   OPHTHALMOLOGY EXAM  08/22/2023   Diabetic kidney evaluation - Urine ACR  01/27/2024   DTaP/Tdap/Td (2 - Td or Tdap) 01/29/2026   COLONOSCOPY (Pts 45-81yrs Insurance coverage will need to be confirmed)  07/23/2026   COVID-19 Vaccine  Completed   Hepatitis C Screening  Completed   HIV Screening  Completed   Zoster Vaccines- Shingrix  Completed   HPV VACCINES  Aged Out   INFLUENZA VACCINE  Discontinued    Discussed health benefits of physical activity, and encouraged him to engage in regular exercise appropriate for his age and condition.  Problem List Items Addressed This Visit      No follow-ups on file. Annual physical exam  Uncontrolled type 2 diabetes mellitus with hyperglycemia ([place]) -     CBC with Differential/Platelet -     Comprehensive metabolic panel -     Glucotrol XL; Take 1 tablet (5 mg total) by mouth 2 (two) times daily.  Dispense: 60 tablet; Refill: 1 -      Losartan Potassium; Take 1 tablet (25 mg total) by mouth daily.  Dispense: 90 tablet; Refill: 1  Screening PSA (prostate specific antigen) -     PSA  Encounter for lipid screening for cardiovascular disease -     Lipid panel  Chronic  fatigue -     TSH -     T4, free -     Testosterone, Free, Total, SHBG -     Vitamin B12  Spinal stenosis of cervical region -     Ambulatory referral to Neurosurgery  Screening for colon cancer -     Ambulatory referral to Gastroenterology  Spinal stenosis of lumbar region, unspecified whether neurogenic claudication present -     Ambulatory referral to Neurosurgery   Screening labs including testosterone and Thyroid due to fatigue Refer to NSG for evaluation of spinal stenosis in cervical and lumbar spine Due to cough, may be from Lisinopril. To stop this and switch to Losartan 25mg  daily for diabetes/kidney protection/HTN Due to abdominal pain, switch Metformin to Glucotrol. Pt specifically asked for brand name.  For diabetes, will stay on Glucotrol 5mg  bid, Farxiga 10mg  daily, and Ozempic weekly. He will contact me and inform me of the dose pen he has at home so I can update his chart.  Pt insists on having another colonoscopy. Last one 2017. Clean exam but he wants to be preventative and feels like 10 years is too long; even after advisement on guidelines. Will refer to [facility] for colonoscopy evaluation.  Leeanne Rio, MD

## 2023-02-20 ENCOUNTER — Other Ambulatory Visit (HOSPITAL_BASED_OUTPATIENT_CLINIC_OR_DEPARTMENT_OTHER): Payer: Self-pay

## 2023-02-20 MED ORDER — INSULIN GLARGINE 100 UNIT/ML SOLOSTAR PEN
PEN_INJECTOR | SUBCUTANEOUS | 11 refills | Status: DC
  Filled 2023-02-20: qty 15, 83d supply, fill #0

## 2023-02-21 ENCOUNTER — Other Ambulatory Visit (HOSPITAL_BASED_OUTPATIENT_CLINIC_OR_DEPARTMENT_OTHER): Payer: Self-pay

## 2023-02-22 LAB — LIPID PANEL
Chol/HDL Ratio: 3.8 ratio (ref 0.0–5.0)
Cholesterol, Total: 209 mg/dL — ABNORMAL HIGH (ref 100–199)
HDL: 55 mg/dL (ref >39)
LDL Chol Calc (NIH): 138 mg/dL — ABNORMAL HIGH (ref 0–99)
Triglycerides: 92 mg/dL (ref 0–149)
VLDL Cholesterol Cal: 16 mg/dL (ref 5–40)

## 2023-02-22 LAB — CBC WITH DIFFERENTIAL/PLATELET
Basophils Absolute: 0 10*3/uL (ref 0.0–0.2)
Basos: 1 %
EOS (ABSOLUTE): 0.1 10*3/uL (ref 0.0–0.4)
Eos: 2 %
Hematocrit: 45.5 % (ref 37.5–51.0)
Hemoglobin: 15.2 g/dL (ref 13.0–17.7)
Immature Grans (Abs): 0 10*3/uL (ref 0.0–0.1)
Immature Granulocytes: 0 %
Lymphocytes Absolute: 1.9 10*3/uL (ref 0.7–3.1)
Lymphs: 39 %
MCH: 30.8 pg (ref 26.6–33.0)
MCHC: 33.4 g/dL (ref 31.5–35.7)
MCV: 92 fL (ref 79–97)
Monocytes Absolute: 0.6 10*3/uL (ref 0.1–0.9)
Monocytes: 12 %
Neutrophils Absolute: 2.2 10*3/uL (ref 1.4–7.0)
Neutrophils: 46 %
Platelets: 296 10*3/uL (ref 150–450)
RBC: 4.94 x10E6/uL (ref 4.14–5.80)
RDW: 11.6 % (ref 11.6–15.4)
WBC: 4.9 10*3/uL (ref 3.4–10.8)

## 2023-02-22 LAB — T4, FREE: Free T4: 1.91 ng/dL — ABNORMAL HIGH (ref 0.82–1.77)

## 2023-02-22 LAB — COMPREHENSIVE METABOLIC PANEL
ALT: 20 IU/L (ref 0–44)
AST: 17 IU/L (ref 0–40)
Albumin/Globulin Ratio: 1.7 (ref 1.2–2.2)
Albumin: 4.3 g/dL (ref 3.8–4.9)
Alkaline Phosphatase: 66 IU/L (ref 44–121)
BUN/Creatinine Ratio: 12 (ref 9–20)
BUN: 15 mg/dL (ref 6–24)
Bilirubin Total: 0.4 mg/dL (ref 0.0–1.2)
CO2: 25 mmol/L (ref 20–29)
Calcium: 9.7 mg/dL (ref 8.7–10.2)
Chloride: 99 mmol/L (ref 96–106)
Creatinine, Ser: 1.23 mg/dL (ref 0.76–1.27)
Globulin, Total: 2.5 g/dL (ref 1.5–4.5)
Glucose: 181 mg/dL — ABNORMAL HIGH (ref 70–99)
Potassium: 4.5 mmol/L (ref 3.5–5.2)
Sodium: 135 mmol/L (ref 134–144)
Total Protein: 6.8 g/dL (ref 6.0–8.5)
eGFR: 68 mL/min/{1.73_m2} (ref >59)

## 2023-02-22 LAB — TESTOSTERONE, FREE, TOTAL, SHBG
Sex Hormone Binding: 22.4 nmol/L (ref 19.3–76.4)
Testosterone, Free: 11.6 pg/mL (ref 7.2–24.0)
Testosterone: 346 ng/dL (ref 264–916)

## 2023-02-22 LAB — PSA: Prostate Specific Ag, Serum: 2 ng/mL (ref 0.0–4.0)

## 2023-02-22 LAB — TSH: TSH: 1.48 u[IU]/mL (ref 0.450–4.500)

## 2023-02-22 LAB — VITAMIN B12: Vitamin B-12: >2000 pg/mL — ABNORMAL HIGH (ref 232–1245)

## 2023-02-23 ENCOUNTER — Other Ambulatory Visit (HOSPITAL_BASED_OUTPATIENT_CLINIC_OR_DEPARTMENT_OTHER): Payer: Self-pay

## 2023-02-24 ENCOUNTER — Other Ambulatory Visit (HOSPITAL_BASED_OUTPATIENT_CLINIC_OR_DEPARTMENT_OTHER): Payer: Self-pay

## 2023-02-24 ENCOUNTER — Ambulatory Visit (INDEPENDENT_AMBULATORY_CARE_PROVIDER_SITE_OTHER): Payer: Commercial Managed Care - PPO

## 2023-02-24 ENCOUNTER — Encounter: Payer: Self-pay | Admitting: Family Medicine

## 2023-02-24 ENCOUNTER — Ambulatory Visit: Payer: Commercial Managed Care - PPO | Admitting: Family Medicine

## 2023-02-24 VITALS — BP 149/80 | HR 87 | Temp 98.9°F | Ht 75.0 in | Wt 223.8 lb

## 2023-02-24 DIAGNOSIS — R059 Cough, unspecified: Secondary | ICD-10-CM

## 2023-02-24 DIAGNOSIS — R0602 Shortness of breath: Secondary | ICD-10-CM

## 2023-02-24 DIAGNOSIS — R079 Chest pain, unspecified: Secondary | ICD-10-CM | POA: Diagnosis not present

## 2023-02-24 MED ORDER — ALBUTEROL SULFATE HFA 108 (90 BASE) MCG/ACT IN AERS
2.0000 | INHALATION_SPRAY | Freq: Four times a day (QID) | RESPIRATORY_TRACT | 0 refills | Status: DC | PRN
  Filled 2023-02-24: qty 6.7, 20d supply, fill #0

## 2023-02-24 NOTE — Patient Instructions (Signed)
I placed an order for chest x-ray today.  We will notify you once the result is back.

## 2023-02-24 NOTE — Progress Notes (Signed)
Established Patient Office Visit  Subjective   Patient ID: Jamie Foster, male    DOB: 09-Dec-1965  Age: 57 y.o. MRN: [id number]  Chief Complaint  Patient presents with   Cough    Started over a month ago. He complains of pain in both sides when he coughs. He also states when he sneezes his sides hurt.      HPI Presents today for an acute visit with complaint of cough with pain with coughing. Hurts in both side when sneezing.  Symptoms have been present  one month.  Associated symptoms include: shortness of breath with talking, no obvious shortness of breath in office Pertinent negatives: no fever has been having chills every day for one week Pain severity: 8/10 in both sides  Treatments tried include : expectorant and tylenol and motrin from urgent care on 01/22/23.  Treatment effective : helped at first.      Review of Systems  Constitutional:  Positive for chills. Negative for fever.  Respiratory:  Positive for cough, sputum production (occassional) and shortness of breath. Negative for hemoptysis.   Cardiovascular:  Negative for chest pain.      Objective:     BP (!) 149/80   Pulse 87   Temp 98.9 F (37.2 C) (Oral)   Ht 6\' 3"  (1.905 m)   Wt 223 lb 12.8 oz (101.5 kg)   SpO2 99%   BMI 27.97 kg/m  BP Readings from Last 3 Encounters:  02/24/23 (!) 149/80  02/19/23 128/78  01/27/23 (!) 174/110      Physical Exam Vitals and nursing note reviewed.  Constitutional:      General: He is not in acute distress.    Appearance: Normal appearance. He is not ill-appearing.  HENT:     Right Ear: Tympanic membrane normal.     Left Ear: Tympanic membrane normal.  Cardiovascular:     Rate and Rhythm: Regular rhythm.     Heart sounds: Normal heart sounds.  Pulmonary:     Effort: Pulmonary effort is normal.     Breath sounds: Normal breath sounds.  Skin:    General: Skin is warm and dry.     Capillary Refill: Capillary refill takes less than 2 seconds.   Neurological:     General: No focal deficit present.     Mental Status: He is alert. Mental status is at baseline.  Psychiatric:        Mood and Affect: Mood normal.        Behavior: Behavior normal.        Thought Content: Thought content normal.        Judgment: Judgment normal.     No results found for any visits on 02/24/23.    The 10-year ASCVD risk score (Arnett DK, et al., 2019) is: 28.3%    Assessment & Plan:   Problem List Items Addressed This Visit   Cough in adult  Cough present for one month. Last visit with PCP, Lisinopril was discontinued. He continues to have cough and pain with coughing. Reports shortness of breath with talking.  There was no obvious shortness of breath in office today.He was able to converse with provider without stopping. No fever, endorses chills. Lungs clear, oxygen saturation 99%. Will get chest x-ray to rule out pneumonia. Albuterol inhaler as instructed for shortness of breath and wheezing as instructed.  -     DG Chest 2 View -     Albuterol Sulfate HFA; Inhale 2 puffs into the  lungs every 6 (six) hours as needed for wheezing or shortness of breath.  Dispense: 8 g; Refill: 0  Shortness of breath -     DG Chest 2 View -     Albuterol Sulfate HFA; Inhale 2 puffs into the lungs every 6 (six) hours as needed for wheezing or shortness of breath.  Dispense: 8 g; Refill: 0    Agrees with plan of care discussed.  Questions answered. Will notify once x-ray result is back. Phone number verified.    Return if symptoms worsen or fail to improve.    Chalmers Guest, FNP

## 2023-02-26 ENCOUNTER — Other Ambulatory Visit (HOSPITAL_BASED_OUTPATIENT_CLINIC_OR_DEPARTMENT_OTHER): Payer: Self-pay

## 2023-03-02 ENCOUNTER — Other Ambulatory Visit (HOSPITAL_BASED_OUTPATIENT_CLINIC_OR_DEPARTMENT_OTHER): Payer: Self-pay

## 2023-03-02 ENCOUNTER — Ambulatory Visit: Payer: Commercial Managed Care - PPO | Admitting: Family Medicine

## 2023-03-02 ENCOUNTER — Other Ambulatory Visit: Payer: Self-pay

## 2023-03-02 ENCOUNTER — Encounter: Payer: Self-pay | Admitting: Family Medicine

## 2023-03-02 VITALS — BP 146/79 | HR 89 | Temp 98.3°F | Resp 20 | Ht 75.0 in | Wt 221.4 lb

## 2023-03-02 DIAGNOSIS — R682 Dry mouth, unspecified: Secondary | ICD-10-CM | POA: Diagnosis not present

## 2023-03-02 DIAGNOSIS — E782 Mixed hyperlipidemia: Secondary | ICD-10-CM

## 2023-03-02 DIAGNOSIS — I10 Essential (primary) hypertension: Secondary | ICD-10-CM | POA: Diagnosis not present

## 2023-03-02 DIAGNOSIS — J309 Allergic rhinitis, unspecified: Secondary | ICD-10-CM | POA: Diagnosis not present

## 2023-03-02 MED ORDER — ROSUVASTATIN CALCIUM 10 MG PO TABS
10.0000 mg | ORAL_TABLET | Freq: Every day | ORAL | 1 refills | Status: DC
  Filled 2023-03-02: qty 90, 90d supply, fill #0
  Filled 2023-05-26: qty 90, 90d supply, fill #1

## 2023-03-02 MED ORDER — LOSARTAN POTASSIUM 25 MG PO TABS: 50.0000 mg | ORAL_TABLET | Freq: Every day | ORAL | 1 refills | Status: DC

## 2023-03-02 MED ORDER — MONTELUKAST SODIUM 10 MG PO TABS
10.0000 mg | ORAL_TABLET | Freq: Every day | ORAL | 1 refills | Status: DC
  Filled 2023-03-02: qty 30, 30d supply, fill #0
  Filled 2023-03-23 – 2023-03-26 (×2): qty 30, 30d supply, fill #1

## 2023-03-02 NOTE — Progress Notes (Signed)
Established Patient Office Visit  Subjective   Patient ID: Jamie Foster, male    DOB: 1965-12-13  Age: 57 y.o. MRN: [id number]  Chief Complaint  Patient presents with   Follow-up    Patient is here to review his lab work as well as discuss his BP, he states that he has had a headache for the past 2 1/2 weeks and his BP has been up    HPI  Lab Review Pt is here for lab review. He was seen here a week ago for annual. These were reviewed with him. Slightly high T4 but normal TSH. Will repeat in 3 months. He also had high cholesterol. Use to be on Atorvastatin 10mg  but caused dry mouth. He says this stopped when he stopped the medicine.   Allergic Rhinitis Pt came here last week for cough with chest pain. He came here and felt like he had pneumonia he was sent for CXR which was negative. He also reports some nasal congestion with rhinorrhea at times. His main concern is the pain in his chest but his CXR was negative. Pt was reassured today. He reports he continues to have left ear pain. He was seen by ENT in 2022 and had laryngoscopy done. He had mucus and edema shown and was diagnosed with chronic rhinitis.   Hypertension Pt was stopped off his Lisinopril due to chronic cough to see if this improved. He was placed on Losartan 25mg  daily.  He also reports this is causing dry mouth and throat. He is diabetic. He reports he's checking blood pressure has been 170/120s. He does switch arms when checking his blood pressure.  Past Medical History:  Diagnosis Date   Allergy    Diabetes mellitus without complication    H pylori ulcer    Hyperlipidemia    Hypertension    Kidney stone    Sleep apnea    Past Surgical History:  Procedure Laterality Date   NASAL SINUS SURGERY     Review of Systems  Musculoskeletal:  Positive for back pain and neck pain.  Endo/Heme/Allergies:        Dry mouth  All other systems reviewed and are negative.    Objective:     BP (!) 146/79   Pulse 89    Temp 98.3 F (36.8 C) (Oral)   Resp 20   Ht 6\' 3"  (1.905 m)   Wt 221 lb 6.4 oz (100.4 kg)   SpO2 99%   BMI 27.67 kg/m    Physical Exam Vitals and nursing note reviewed.  Constitutional:      Appearance: Normal appearance. He is normal weight.  HENT:     Head: Normocephalic and atraumatic.     Right Ear: External ear normal.     Left Ear: External ear normal.     Mouth/Throat:     Mouth: Mucous membranes are moist.  Eyes:     Conjunctiva/sclera: Conjunctivae normal.     Pupils: Pupils are equal, round, and reactive to light.  Cardiovascular:     Rate and Rhythm: Normal rate and regular rhythm.     Pulses: Normal pulses.     Heart sounds: Normal heart sounds.  Pulmonary:     Effort: Pulmonary effort is normal.     Breath sounds: Normal breath sounds.  Abdominal:     General: Abdomen is flat. Bowel sounds are normal.  Skin:    General: Skin is warm.     Capillary Refill: Capillary refill takes less than  2 seconds.  Neurological:     General: No focal deficit present.     Mental Status: He is alert and oriented to person, place, and time. Mental status is at baseline.  Psychiatric:        Mood and Affect: Mood normal.        Behavior: Behavior normal.        Thought Content: Thought content normal.        Judgment: Judgment normal.    No results found for any visits on 03/02/23.    The 10-year ASCVD risk score (Arnett DK, et al., 2019) is: 27.4%    Assessment & Plan:   Problem List Items Addressed This Visit   None Mixed hyperlipidemia -     Rosuvastatin Calcium; Take 1 tablet (10 mg total) by mouth daily.  Dispense: 90 tablet; Refill: 1  -pt reported dry mouth with Atorvastatin and stopped this in the past. Discussed pt's lipid results with him today along with diabetes diagnosed. Benefit outweighs risk and will try a different statin. Pt in agreement with this plan.  Allergic rhinitis, unspecified seasonality, unspecified trigger -     Montelukast Sodium;  Take 1 tablet (10 mg total) by mouth at bedtime.  Dispense: 30 tablet; Refill: 1  -pt's recurrent cough with ear symptoms along with nasal congestion, itchy throat; likely chronic allergic rhinitis. Not taking anything for it. Trial of Singulair today. He has seen ENT in the past and this evaluation reviewed with pt today. He is in agreement with this plan.  Uncontrolled hypertension -     Losartan Potassium; Take 2 tablets (50 mg total) by mouth daily.  Dispense: 90 tablet; Refill: 1  -pt reports continued high blood pressures at home. Will increase Losartan from 25mg  to 50mg  daily. To start taking 2 of the 25 mg tabs.   Dry mouth  -I explained to pt today that I'm not convinced the Atorvastatin and now the Losartan is causing dry mouth. Explained to him that dry mouth can come with his uncontrolled diabetes. Will continue to monitor and recheck A1c in June as scheduled last A1c 8.6 a month ago which is the best pt has had as he's been nonadherent to treatment in the past such as his insulin.    No follow-ups on file.   Total time spent with patient today 53 minutes. This includes reviewing records, evaluating the patient and coordinating care. Face-to-face time >50%.  Suzan Slick, MD

## 2023-03-03 ENCOUNTER — Encounter: Payer: Commercial Managed Care - PPO | Admitting: Family Medicine

## 2023-03-09 ENCOUNTER — Telehealth: Payer: Self-pay | Admitting: Family Medicine

## 2023-03-09 DIAGNOSIS — M5412 Radiculopathy, cervical region: Secondary | ICD-10-CM | POA: Diagnosis not present

## 2023-03-09 DIAGNOSIS — M47816 Spondylosis without myelopathy or radiculopathy, lumbar region: Secondary | ICD-10-CM | POA: Diagnosis not present

## 2023-03-09 DIAGNOSIS — Z6828 Body mass index (BMI) 28.0-28.9, adult: Secondary | ICD-10-CM | POA: Diagnosis not present

## 2023-03-09 NOTE — Telephone Encounter (Signed)
I already doubled his dosage during his appointment with me last week. I advised him to start taking 2 of the losartan 25 mg daily. Keep blood pressure log and report readings in 2 weeks.  He should wait at least 4-5 hours after taking the medicine to check. Also making sure to take his medicine in the morning and not at night.

## 2023-03-09 NOTE — Telephone Encounter (Signed)
Patient called to state his blood pressure is high and is requesting a doubling of the dosage on his current medication. Please advise. Patient requested call back to discuss if possible. Katha Hamming

## 2023-03-10 NOTE — Telephone Encounter (Signed)
Spoke with patient and advised him of Dr. Lucrezia Europe' note from original mesage :  Dr. Wyline Mood: I already doubled his dosage during his appointment with me last week. I advised him to start taking 2 of the losartan 25 mg daily. Keep blood pressure log and report readings in 2 weeks.  He should wait at least 4-5 hours after taking the medicine to check. Also making sure to take his medicine in the morning and not at night.   Patient gave a verbal understanding and states that he will do as advised.

## 2023-03-17 ENCOUNTER — Other Ambulatory Visit (HOSPITAL_BASED_OUTPATIENT_CLINIC_OR_DEPARTMENT_OTHER): Payer: Self-pay

## 2023-03-17 ENCOUNTER — Other Ambulatory Visit: Payer: Self-pay | Admitting: Family Medicine

## 2023-03-17 DIAGNOSIS — E1165 Type 2 diabetes mellitus with hyperglycemia: Secondary | ICD-10-CM

## 2023-03-18 ENCOUNTER — Other Ambulatory Visit: Payer: Self-pay

## 2023-03-18 ENCOUNTER — Other Ambulatory Visit (HOSPITAL_BASED_OUTPATIENT_CLINIC_OR_DEPARTMENT_OTHER): Payer: Self-pay

## 2023-03-18 MED ORDER — DEXCOM G7 SENSOR MISC
1 refills | Status: DC
  Filled 2023-03-18: qty 3, 30d supply, fill #0
  Filled 2023-04-07: qty 9, 90d supply, fill #0

## 2023-03-18 MED ORDER — LOSARTAN POTASSIUM 50 MG PO TABS
50.0000 mg | ORAL_TABLET | Freq: Every day | ORAL | 1 refills | Status: DC
  Filled 2023-03-18: qty 90, 90d supply, fill #0
  Filled 2023-03-20: qty 30, 30d supply, fill #0
  Filled 2023-04-15: qty 30, 30d supply, fill #1
  Filled 2023-04-29: qty 30, 30d supply, fill #2

## 2023-03-18 NOTE — Telephone Encounter (Signed)
I have sent refills on the Losartan to  daily. He had the 25 mg and was taking 2 of them. Please inform pt of this dose change of  and to go back to taking 1 tab a day.

## 2023-03-19 ENCOUNTER — Other Ambulatory Visit: Payer: Self-pay

## 2023-03-19 ENCOUNTER — Other Ambulatory Visit (HOSPITAL_BASED_OUTPATIENT_CLINIC_OR_DEPARTMENT_OTHER): Payer: Self-pay

## 2023-03-20 ENCOUNTER — Other Ambulatory Visit (HOSPITAL_BASED_OUTPATIENT_CLINIC_OR_DEPARTMENT_OTHER): Payer: Self-pay

## 2023-03-21 ENCOUNTER — Other Ambulatory Visit (HOSPITAL_BASED_OUTPATIENT_CLINIC_OR_DEPARTMENT_OTHER): Payer: Self-pay

## 2023-03-23 ENCOUNTER — Other Ambulatory Visit (HOSPITAL_BASED_OUTPATIENT_CLINIC_OR_DEPARTMENT_OTHER): Payer: Self-pay

## 2023-03-24 ENCOUNTER — Other Ambulatory Visit (HOSPITAL_BASED_OUTPATIENT_CLINIC_OR_DEPARTMENT_OTHER): Payer: Self-pay

## 2023-03-25 ENCOUNTER — Ambulatory Visit: Payer: Commercial Managed Care - PPO | Admitting: Family Medicine

## 2023-03-25 ENCOUNTER — Other Ambulatory Visit (HOSPITAL_BASED_OUTPATIENT_CLINIC_OR_DEPARTMENT_OTHER): Payer: Self-pay

## 2023-03-25 ENCOUNTER — Encounter: Payer: Self-pay | Admitting: Family Medicine

## 2023-03-25 VITALS — BP 169/79 | HR 100 | Temp 97.5°F | Resp 18 | Ht 75.0 in | Wt 221.0 lb

## 2023-03-25 DIAGNOSIS — R41840 Attention and concentration deficit: Secondary | ICD-10-CM | POA: Diagnosis not present

## 2023-03-25 DIAGNOSIS — J309 Allergic rhinitis, unspecified: Secondary | ICD-10-CM | POA: Diagnosis not present

## 2023-03-25 DIAGNOSIS — M51369 Other intervertebral disc degeneration, lumbar region without mention of lumbar back pain or lower extremity pain: Secondary | ICD-10-CM

## 2023-03-25 DIAGNOSIS — M5030 Other cervical disc degeneration, unspecified cervical region: Secondary | ICD-10-CM

## 2023-03-25 DIAGNOSIS — I10 Essential (primary) hypertension: Secondary | ICD-10-CM | POA: Diagnosis not present

## 2023-03-25 DIAGNOSIS — M5136 Other intervertebral disc degeneration, lumbar region: Secondary | ICD-10-CM

## 2023-03-25 MED ORDER — OLOPATADINE HCL 0.2 % OP SOLN
1.0000 [drp] | Freq: Every day | OPHTHALMIC | 1 refills | Status: DC | PRN
  Filled 2023-03-25: qty 2.5, 50d supply, fill #0

## 2023-03-25 MED ORDER — HYDROCHLOROTHIAZIDE 25 MG PO TABS
25.0000 mg | ORAL_TABLET | Freq: Every day | ORAL | 0 refills | Status: DC
  Filled 2023-03-25: qty 30, 30d supply, fill #0

## 2023-03-25 NOTE — Progress Notes (Signed)
Acute Office Visit  Subjective:     Patient ID: Jamie Foster, male    DOB: 06-07-1966, 57 y.o.   MRN: [id number]  Chief Complaint  Patient presents with   Follow-up    Patient is here for follow up he has concerns about his BP still being high, He has brought BP cuff with him to compare readings,     HPI Patient is in today for acute visit.  HTN Pt is here today and concerned about his elevated blood pressures at home. They are averaging 160s SBP all day long. He was increased on his Losartan from 25-50mg . He is concerned with elevated blood pressures. Of note, per PMP and medication reconciliation; pt is taking Adderall 30mg  daily. He has been on this since 2019.   Pt also has been seen by NSG but would like new referral and 2nd opinion by sports medicine provider in [facility].  Pt has allergic rhinitis. He is having itchy eyes. He would like prescription for this to help his eye symptoms.   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 macular edema associated with type 2 diabetes mellitus (HCC) 08/21/2022   LPRD (laryngopharyngeal reflux disease) 07/24/2022   Tympanic membrane disorder, left 07/24/2022   Uncontrolled type 2 diabetes mellitus with hyperglycemia (HCC) 08/20/2020   Hyperlipidemia 01/23/2019   Low back pain without sciatica 01/23/2019   Uncontrolled type 2 diabetes mellitus with retinopathy, without long-term current use of insulin 12/21/2015   Family hx of prostate cancer 06/22/2013   Allergic rhinitis 06/22/2013   OSA (obstructive sleep apnea) 10/06/2012   BMI 30.0-30.9,adult 07/19/2012   HTN (hypertension) 07/19/2012   GERD (gastroesophageal reflux disease) 07/19/2012   DDD (degenerative disc disease), cervical 07/19/2012     Review of Systems  Eyes:        Itchy eyes  All other systems reviewed and are  negative.       Objective:    BP (!) 169/79   Pulse 100   Temp (!) 97.5 F (36.4 C) (Oral)   Resp 18   Ht 6\' 3"  (1.905 m)   Wt 221 lb (100.2 kg)   SpO2 100%   BMI 27.62 kg/m    Physical Exam Vitals and nursing note reviewed.  Constitutional:      Appearance: Normal appearance. He is normal weight.  HENT:     Head: Normocephalic and atraumatic.     Right Ear: External ear normal.     Nose: Nose normal.     Mouth/Throat:     Mouth: Mucous membranes are moist.  Cardiovascular:     Rate and Rhythm: Normal rate.  Pulmonary:     Effort: Pulmonary effort is normal.  Skin:    Capillary Refill: Capillary refill takes less than 2 seconds.  Neurological:     Mental Status: He is alert.  Psychiatric:        Mood and Affect: Mood normal.        Behavior: Behavior normal.        Thought Content: Thought content normal.        Judgment: Judgment normal.   No results found for any visits on 03/25/23.      Assessment & Plan:   Problem List Items Addressed This Visit   None   No orders of the defined types were placed in this encounter.  Primary hypertension -     hydroCHLOROthiazide; Take 1 tablet (25 mg total) by mouth daily.  Dispense: 30 tablet; Refill: 0  Lack of concentration  DDD (degenerative disc disease), lumbar -     Ambulatory referral to Sports Medicine  DDD (degenerative disc disease), cervical -     Ambulatory referral to Sports Medicine  Allergic rhinitis, unspecified seasonality, unspecified trigger -     Olopatadine HCl; Apply 1 drop to eye daily as needed.  Dispense: 2.5 mL; Refill: 1   Blood pressure not at goal. To send in HCTZ 25mg  brand name preferred by pt, to go along with Losartan 50mg . Continue to check blood pressures at home and return in 2 weeks for follow up. I've discussed with pt the possible cause of HTN being the Adderall? Will revisit this issue in 2 weeks. [facility]er to [facility] per request.  Add eye drops  to  help relieve itchy eyes.  No follow-ups on file.  Suzan Slick, MD  Total time spent with patient today 30 minutes. This includes reviewing records, evaluating the patient and coordinating care. Face-to-face time >50%.

## 2023-03-26 ENCOUNTER — Other Ambulatory Visit (HOSPITAL_BASED_OUTPATIENT_CLINIC_OR_DEPARTMENT_OTHER): Payer: Self-pay

## 2023-03-26 ENCOUNTER — Other Ambulatory Visit: Payer: Self-pay

## 2023-03-30 ENCOUNTER — Other Ambulatory Visit (HOSPITAL_BASED_OUTPATIENT_CLINIC_OR_DEPARTMENT_OTHER): Payer: Self-pay

## 2023-03-31 ENCOUNTER — Other Ambulatory Visit (HOSPITAL_BASED_OUTPATIENT_CLINIC_OR_DEPARTMENT_OTHER): Payer: Self-pay

## 2023-03-31 ENCOUNTER — Other Ambulatory Visit: Payer: Self-pay

## 2023-04-07 ENCOUNTER — Other Ambulatory Visit (HOSPITAL_BASED_OUTPATIENT_CLINIC_OR_DEPARTMENT_OTHER): Payer: Self-pay

## 2023-04-07 MED ORDER — KETOCONAZOLE 2 % EX CREA
1.0000 | TOPICAL_CREAM | Freq: Every day | CUTANEOUS | 0 refills | Status: DC
  Filled 2023-04-07: qty 60, 60d supply, fill #0
  Filled 2023-04-07: qty 30, 30d supply, fill #0

## 2023-04-08 ENCOUNTER — Ambulatory Visit: Payer: Commercial Managed Care - PPO | Admitting: Family Medicine

## 2023-04-15 ENCOUNTER — Other Ambulatory Visit (HOSPITAL_BASED_OUTPATIENT_CLINIC_OR_DEPARTMENT_OTHER): Payer: Self-pay

## 2023-04-15 ENCOUNTER — Other Ambulatory Visit: Payer: Self-pay | Admitting: Family Medicine

## 2023-04-15 DIAGNOSIS — E1165 Type 2 diabetes mellitus with hyperglycemia: Secondary | ICD-10-CM

## 2023-04-15 DIAGNOSIS — J309 Allergic rhinitis, unspecified: Secondary | ICD-10-CM

## 2023-04-16 ENCOUNTER — Other Ambulatory Visit (HOSPITAL_BASED_OUTPATIENT_CLINIC_OR_DEPARTMENT_OTHER): Payer: Self-pay

## 2023-04-16 MED ORDER — GLUCOTROL XL 5 MG PO TB24
5.0000 mg | ORAL_TABLET | Freq: Two times a day (BID) | ORAL | 1 refills | Status: DC
  Filled 2023-04-16: qty 60, 30d supply, fill #0
  Filled 2023-05-26: qty 60, 30d supply, fill #1

## 2023-04-16 MED ORDER — MONTELUKAST SODIUM 10 MG PO TABS
10.0000 mg | ORAL_TABLET | Freq: Every day | ORAL | 1 refills | Status: DC
  Filled 2023-04-16 – 2023-05-12 (×3): qty 30, 30d supply, fill #0
  Filled 2023-05-26: qty 30, 30d supply, fill #1

## 2023-04-17 ENCOUNTER — Other Ambulatory Visit (HOSPITAL_BASED_OUTPATIENT_CLINIC_OR_DEPARTMENT_OTHER): Payer: Self-pay

## 2023-04-17 MED ORDER — METHYLPREDNISOLONE 4 MG PO TBPK
ORAL_TABLET | ORAL | 0 refills | Status: AC
  Filled 2023-04-17: qty 21, 6d supply, fill #0

## 2023-04-29 ENCOUNTER — Other Ambulatory Visit: Payer: Self-pay | Admitting: Family Medicine

## 2023-04-29 ENCOUNTER — Other Ambulatory Visit (HOSPITAL_BASED_OUTPATIENT_CLINIC_OR_DEPARTMENT_OTHER): Payer: Self-pay

## 2023-04-29 DIAGNOSIS — E1165 Type 2 diabetes mellitus with hyperglycemia: Secondary | ICD-10-CM

## 2023-04-29 MED ORDER — LOSARTAN POTASSIUM 50 MG PO TABS
50.0000 mg | ORAL_TABLET | Freq: Every day | ORAL | 1 refills | Status: DC
  Filled 2023-04-29: qty 90, 90d supply, fill #0

## 2023-05-04 ENCOUNTER — Ambulatory Visit: Payer: Commercial Managed Care - PPO | Admitting: Family Medicine

## 2023-05-04 ENCOUNTER — Encounter: Payer: Self-pay | Admitting: Family Medicine

## 2023-05-04 ENCOUNTER — Telehealth: Payer: Self-pay | Admitting: Family Medicine

## 2023-05-04 ENCOUNTER — Other Ambulatory Visit (HOSPITAL_BASED_OUTPATIENT_CLINIC_OR_DEPARTMENT_OTHER): Payer: Self-pay

## 2023-05-04 VITALS — BP 154/85 | HR 86 | Temp 97.6°F | Resp 18 | Ht 75.0 in | Wt 229.3 lb

## 2023-05-04 DIAGNOSIS — M5030 Other cervical disc degeneration, unspecified cervical region: Secondary | ICD-10-CM | POA: Diagnosis not present

## 2023-05-04 DIAGNOSIS — M5136 Other intervertebral disc degeneration, lumbar region: Secondary | ICD-10-CM | POA: Diagnosis not present

## 2023-05-04 DIAGNOSIS — J309 Allergic rhinitis, unspecified: Secondary | ICD-10-CM

## 2023-05-04 DIAGNOSIS — I10 Essential (primary) hypertension: Secondary | ICD-10-CM

## 2023-05-04 DIAGNOSIS — Z7984 Long term (current) use of oral hypoglycemic drugs: Secondary | ICD-10-CM

## 2023-05-04 DIAGNOSIS — E1165 Type 2 diabetes mellitus with hyperglycemia: Secondary | ICD-10-CM

## 2023-05-04 LAB — POCT GLYCOSYLATED HEMOGLOBIN (HGB A1C): Hemoglobin A1C: 9.1 % — AB (ref 4.0–5.6)

## 2023-05-04 MED ORDER — OZEMPIC (1 MG/DOSE) 4 MG/3ML ~~LOC~~ SOPN
1.0000 mg | PEN_INJECTOR | SUBCUTANEOUS | 1 refills | Status: DC
  Filled 2023-05-04: qty 3, 28d supply, fill #0
  Filled 2023-06-18: qty 9, 84d supply, fill #1
  Filled 2023-09-04: qty 3, 28d supply, fill #2

## 2023-05-04 MED ORDER — LANTUS SOLOSTAR 100 UNIT/ML ~~LOC~~ SOPN
18.0000 [IU] | PEN_INJECTOR | Freq: Every day | SUBCUTANEOUS | 11 refills | Status: DC
  Filled 2023-05-04: qty 15, 83d supply, fill #0
  Filled 2023-09-04: qty 15, 83d supply, fill #1
  Filled 2023-10-13: qty 15, 83d supply, fill #2
  Filled 2024-01-07: qty 15, 83d supply, fill #3
  Filled 2024-04-29: qty 15, 83d supply, fill #4

## 2023-05-04 MED ORDER — LOSARTAN POTASSIUM-HCTZ 100-25 MG PO TABS
1.0000 | ORAL_TABLET | Freq: Every day | ORAL | 1 refills | Status: DC
  Filled 2023-05-04 – 2023-05-12 (×3): qty 90, 90d supply, fill #0
  Filled 2023-07-29: qty 90, 90d supply, fill #1

## 2023-05-04 MED ORDER — OLOPATADINE HCL 0.2 % OP SOLN
1.0000 [drp] | Freq: Every day | OPHTHALMIC | 1 refills | Status: DC | PRN
  Filled 2023-05-04: qty 2.5, 50d supply, fill #0
  Filled 2023-07-14: qty 2.5, 50d supply, fill #1

## 2023-05-04 NOTE — Telephone Encounter (Signed)
Please inform pt he requested brand names on all medicines. I did send the brand Hyzaar in compared to the recommended generic one due to high cost. Just FYI.

## 2023-05-04 NOTE — Addendum Note (Signed)
Addended by: Suzan Slick on: 05/04/2023 10:39 AM   Modules accepted: Orders

## 2023-05-04 NOTE — Telephone Encounter (Signed)
Patient called after appointment to request medication refills be sent to [facility]. Katha Hamming

## 2023-05-04 NOTE — Progress Notes (Addendum)
Established Patient Office Visit  Subjective   Patient ID: Jamie Foster, male    DOB: 12/05/1965  Age: 57 y.o. MRN: [id number]  Chief Complaint  Patient presents with   Follow-up    Patient is here for a Follow up visit for DM and HTN    HPI  Hypertension Taking medicine Losartan 50mg  and HCTZ 25 mg daily around 9am. Checking blood pressures in the afternoon and has been running 150s SBP. Has hx of inattention and if he has a lot of work, he may take Adderall as needed. He reports not taking the Adderall since last visit with me.  Pt will need new rx for Hyzaar 100/25mg  daily. He will contact me back and let me know where to send this rx.  Diabetes Pt is using Farxiga 10 mg daily, Glucotrol 5mg  BID and Lantus 18 units nightly prn along with Ozempic 1mg  weekly. He reports he doesn't use the insulin every day. He says he feels good and when he does check his sugar with CGM, the readings have been good. So he doesn't take the Lantus at night on those days. He says there are times he wakes up at night and they are dropping 98. He feels bad at that time. He says the last time he done the shot, was last week. Pt will need rx refills on Lantus and Ozempic.  Back pain/Neck pain Pt reports he went to the sports medicine. He has recommended therapy. He was given 2 weeks worth of PT at home. He goes back next week for this. He feels like the therapy is helping.   Allergic conjuctivitis Pt requests refills on his Pataday eye drops he uses PRN.  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 macular edema associated with type 2 diabetes mellitus (HCC) 08/21/2022   LPRD (laryngopharyngeal reflux disease) 07/24/2022   Tympanic membrane disorder, left 07/24/2022   Uncontrolled type 2 diabetes mellitus with hyperglycemia (HCC) 08/20/2020   Hyperlipidemia 01/23/2019    Low back pain without sciatica 01/23/2019   Uncontrolled type 2 diabetes mellitus with retinopathy, without long-term current use of insulin 12/21/2015   Family hx of prostate cancer 06/22/2013   Allergic rhinitis 06/22/2013   OSA (obstructive sleep apnea) 10/06/2012   BMI 30.0-30.9,adult 07/19/2012   HTN (hypertension) 07/19/2012   GERD (gastroesophageal reflux disease) 07/19/2012   DDD (degenerative disc disease), cervical 07/19/2012    Review of Systems  Musculoskeletal:  Positive for back pain.  All other systems reviewed and are negative.    Objective:     BP (!) 154/85   Pulse 86   Temp 97.6 F (36.4 C) (Oral)   Resp 18   Ht 6\' 3"  (1.905 m)   Wt 229 lb 4.8 oz (104 kg)   SpO2 99%   BMI 28.66 kg/m    Physical Exam Vitals and nursing note reviewed.  Constitutional:      Appearance: Normal appearance. He is normal weight.  HENT:     Head: Normocephalic and atraumatic.     Right Ear: External ear normal.     Left Ear: External ear normal.     Nose: Nose normal.     Mouth/Throat:     Mouth: Mucous membranes are moist.  Eyes:     Extraocular Movements: Extraocular movements intact.     Pupils: Pupils are equal, round, and  reactive to light.  Cardiovascular:     Rate and Rhythm: Normal rate and regular rhythm.     Pulses: Normal pulses.     Heart sounds: Normal heart sounds.  Pulmonary:     Effort: Pulmonary effort is normal.     Breath sounds: Normal breath sounds.  Skin:    General: Skin is warm.     Capillary Refill: Capillary refill takes less than 2 seconds.  Neurological:     General: No focal deficit present.     Mental Status: He is alert and oriented to person, place, and time. Mental status is at baseline.  Psychiatric:        Mood and Affect: Mood normal.        Behavior: Behavior normal.        Thought Content: Thought content normal.        Judgment: Judgment normal.    No results found for any visits on 05/04/23.    The 10-year ASCVD  risk score (Arnett DK, et al., 2019) is: 29.8%    Assessment & Plan:   Uncontrolled type 2 diabetes mellitus with hyperglycemia (HCC) -     POCT glycosylated hemoglobin (Hb A1C)  Primary hypertension  DDD (degenerative disc disease), lumbar  DDD (degenerative disc disease), cervical  Allergic rhinitis, unspecified seasonality, unspecified trigger    Lab Results  Component Value Date   HGBA1C 9.1 (A) 05/04/2023   Diabetes not at goal. Up from 8.6. Pt hasn't been taking his Lantus every night. He reports he is scared to due to drops to 90s at night.  Advised him to do injection in the morning. Continue Ozempic 1mg  weekly along with Marcelline Deist 10mg  daily and Glucotrol 5mg  BID.  Blood pressure not at goal. To increase from Losartan 50mg  and HCTZ 25mg  to Losartan/HCTZ 100-25mg . Pt requests brand name in all of his medicines. He will contact me back and let me know where to send the refills. He also needs refills on Pataday To continue PT and follow up with sports medicine on Back and neck pain. PT is helping per patient.     No follow-ups on file.    Suzan Slick, MD

## 2023-05-05 ENCOUNTER — Other Ambulatory Visit (HOSPITAL_BASED_OUTPATIENT_CLINIC_OR_DEPARTMENT_OTHER): Payer: Self-pay

## 2023-05-06 ENCOUNTER — Other Ambulatory Visit (HOSPITAL_BASED_OUTPATIENT_CLINIC_OR_DEPARTMENT_OTHER): Payer: Self-pay

## 2023-05-12 ENCOUNTER — Telehealth: Payer: Self-pay | Admitting: Family Medicine

## 2023-05-12 ENCOUNTER — Other Ambulatory Visit (HOSPITAL_BASED_OUTPATIENT_CLINIC_OR_DEPARTMENT_OTHER): Payer: Self-pay

## 2023-05-12 NOTE — Telephone Encounter (Signed)
Patient calling to ask about PAs for these medications. The pharmacy states the insurance must approve it but no PA has been initiated at this time. Please advise.  dapagliflozin propanediol (FARXIGA) 10 MG TABS tablet  montelukast (SINGULAIR) 10 MG tablet  As a note, hard to hear patient over phone and patient would like nurse to call back at number on file. Katha Hamming

## 2023-05-26 ENCOUNTER — Telehealth: Payer: Self-pay

## 2023-05-26 ENCOUNTER — Other Ambulatory Visit (HOSPITAL_BASED_OUTPATIENT_CLINIC_OR_DEPARTMENT_OTHER): Payer: Self-pay

## 2023-05-26 NOTE — Telephone Encounter (Signed)
Initiated Prior authorization UJW:[id number] 10MG  tablets Via: Covermymeds Case/Key:[id number] Status: n/a as of 05/26/23 Reason:Member should be able to get the drug/product without a PA at this time. Pt has high co-pay  Notified Pt via: Mychart

## 2023-06-18 ENCOUNTER — Other Ambulatory Visit (HOSPITAL_BASED_OUTPATIENT_CLINIC_OR_DEPARTMENT_OTHER): Payer: Self-pay

## 2023-06-18 ENCOUNTER — Other Ambulatory Visit: Payer: Self-pay | Admitting: Family Medicine

## 2023-06-18 DIAGNOSIS — E1165 Type 2 diabetes mellitus with hyperglycemia: Secondary | ICD-10-CM

## 2023-06-18 DIAGNOSIS — J309 Allergic rhinitis, unspecified: Secondary | ICD-10-CM

## 2023-06-18 MED ORDER — GLUCOTROL XL 5 MG PO TB24
5.0000 mg | ORAL_TABLET | Freq: Two times a day (BID) | ORAL | 1 refills | Status: DC
  Filled 2023-06-18: qty 60, 30d supply, fill #0
  Filled 2023-06-19: qty 40, 20d supply, fill #0
  Filled 2023-06-19: qty 20, 10d supply, fill #0
  Filled 2023-07-22: qty 60, 30d supply, fill #1

## 2023-06-18 MED ORDER — MONTELUKAST SODIUM 10 MG PO TABS
10.0000 mg | ORAL_TABLET | Freq: Every day | ORAL | 1 refills | Status: DC
Start: 2023-06-18 — End: 2023-09-04
  Filled 2023-06-18 (×2): qty 30, 30d supply, fill #0
  Filled 2023-08-07: qty 30, 30d supply, fill #1

## 2023-06-19 ENCOUNTER — Other Ambulatory Visit (HOSPITAL_BASED_OUTPATIENT_CLINIC_OR_DEPARTMENT_OTHER): Payer: Self-pay

## 2023-06-19 ENCOUNTER — Other Ambulatory Visit: Payer: Self-pay

## 2023-06-19 MED ORDER — ATORVASTATIN CALCIUM 10 MG PO TABS
10.0000 mg | ORAL_TABLET | Freq: Every morning | ORAL | 1 refills | Status: DC
  Filled 2023-06-19: qty 90, 90d supply, fill #0

## 2023-06-22 ENCOUNTER — Other Ambulatory Visit (HOSPITAL_BASED_OUTPATIENT_CLINIC_OR_DEPARTMENT_OTHER): Payer: Self-pay

## 2023-06-23 ENCOUNTER — Other Ambulatory Visit (HOSPITAL_BASED_OUTPATIENT_CLINIC_OR_DEPARTMENT_OTHER): Payer: Self-pay

## 2023-07-14 ENCOUNTER — Other Ambulatory Visit (HOSPITAL_BASED_OUTPATIENT_CLINIC_OR_DEPARTMENT_OTHER): Payer: Self-pay

## 2023-07-14 MED ORDER — NEOMYCIN-POLYMYXIN-HC 3.5-10000-1 OT SUSP
3.0000 [drp] | Freq: Four times a day (QID) | OTIC | 1 refills | Status: DC
  Filled 2023-07-14: qty 10, 17d supply, fill #0

## 2023-07-15 ENCOUNTER — Encounter: Payer: Self-pay | Admitting: Family Medicine

## 2023-07-15 ENCOUNTER — Ambulatory Visit: Payer: Commercial Managed Care - PPO | Admitting: Family Medicine

## 2023-07-15 ENCOUNTER — Other Ambulatory Visit (HOSPITAL_BASED_OUTPATIENT_CLINIC_OR_DEPARTMENT_OTHER): Payer: Self-pay

## 2023-07-15 ENCOUNTER — Telehealth: Payer: Self-pay

## 2023-07-15 ENCOUNTER — Ambulatory Visit (INDEPENDENT_AMBULATORY_CARE_PROVIDER_SITE_OTHER): Payer: Commercial Managed Care - PPO

## 2023-07-15 VITALS — BP 130/75 | HR 82 | Temp 97.8°F | Resp 20 | Ht 75.0 in | Wt 231.5 lb

## 2023-07-15 DIAGNOSIS — J069 Acute upper respiratory infection, unspecified: Secondary | ICD-10-CM

## 2023-07-15 DIAGNOSIS — M48061 Spinal stenosis, lumbar region without neurogenic claudication: Secondary | ICD-10-CM | POA: Diagnosis not present

## 2023-07-15 DIAGNOSIS — Z7984 Long term (current) use of oral hypoglycemic drugs: Secondary | ICD-10-CM

## 2023-07-15 DIAGNOSIS — E1165 Type 2 diabetes mellitus with hyperglycemia: Secondary | ICD-10-CM

## 2023-07-15 DIAGNOSIS — R079 Chest pain, unspecified: Secondary | ICD-10-CM | POA: Diagnosis not present

## 2023-07-15 MED ORDER — AMOXICILLIN-POT CLAVULANATE 875-125 MG PO TABS
1.0000 | ORAL_TABLET | Freq: Two times a day (BID) | ORAL | 0 refills | Status: AC
Start: 2023-07-15 — End: 2023-07-22
  Filled 2023-07-15: qty 14, 7d supply, fill #0

## 2023-07-15 NOTE — Telephone Encounter (Signed)
Called patient to inform him of Chest Xray results.

## 2023-07-15 NOTE — Progress Notes (Signed)
Acute Office Visit  Subjective:     Patient ID: Jamie Foster, male    DOB: Oct 19, 1966, 57 y.o.   MRN: [id number]  Chief Complaint  Patient presents with   Sore Throat    Patient states that he has been dealing with a sore throat,body aches, and body chills for the last week, he states that  he has been sneezing  and has recently been experiencing some kind of chest pain he states that it feels like its underneath his rib cage and is painful when he bends down    Sore Throat  Associated symptoms include coughing.  Patient is in today for acute visit. Pt also has multiple chronic complaints today.  Pt reports sore throat, body aches, and chills in the 2 weeks. He reports when he bends down, he has mid-sternal pain. He also reports cough in the last week.  He reports he was at a cookout a week ago before he started feeling his symptoms. He does report [place] and he picked her up from the airport 2 weeks ago. She is also sick. Tried OTC cough medicines and hasn't helped.  Pt also reports bilateral hip pain and weakness. He has hx of DDD of lumbar spine. He is seeing sports medicine provider and going through PT at home.  He reports dry mouth for months. He is uncontrolled diabetic for the last 6 years. This was discussed with pt today.  Review of Systems  Constitutional:  Positive for chills.  HENT:         Dry mouth  Respiratory:  Positive for cough.   Cardiovascular:  Positive for chest pain.  Musculoskeletal:  Positive for back pain and joint pain.  All other systems reviewed and are negative.      Objective:    BP 130/75   Pulse 82   Temp 97.8 F (36.6 C) (Oral)   Resp 20   Ht 6\' 3"  (1.905 m)   Wt 231 lb 8 oz (105 kg)   SpO2 98%   BMI 28.94 kg/m    Physical Exam Vitals and nursing note reviewed.  Constitutional:      Appearance: Normal appearance. He is normal weight.  HENT:     Head: Normocephalic and atraumatic.     Right Ear:  Tympanic membrane, ear canal and external ear normal.     Left Ear: External ear normal.     Ears:     Comments: Erythematous and bulging left TM    Nose: Nose normal.     Mouth/Throat:     Mouth: Mucous membranes are moist.     Pharynx: Oropharynx is clear.  Eyes:     Conjunctiva/sclera: Conjunctivae normal.     Pupils: Pupils are equal, round, and reactive to light.  Cardiovascular:     Rate and Rhythm: Normal rate and regular rhythm.     Pulses: Normal pulses.     Heart sounds: Normal heart sounds.  Pulmonary:     Effort: Pulmonary effort is normal.     Breath sounds: Normal breath sounds.  Abdominal:     General: Abdomen is flat. Bowel sounds are normal.  Skin:    General: Skin is warm.     Capillary Refill: Capillary refill takes less than 2 seconds.  Neurological:     General: No focal deficit present.     Mental Status: He is alert and oriented to person, place, and time. Mental status is at baseline.  Psychiatric:        Mood and Affect: Mood normal.        Behavior: Behavior normal.        Thought Content: Thought content normal.        Judgment: Judgment normal.   No results found for any visits on 07/15/23.      Assessment & Plan:   Problem List Items Addressed This Visit   None Visit Diagnoses     Upper respiratory tract infection, unspecified type    -  Primary   Relevant Orders   POC COVID-19 BinaxNow   POCT Influenza A/B      Upper respiratory tract infection, unspecified type -     DG Chest 2 View; Future -     Amoxicillin-Pot Clavulanate; Take 1 tablet by mouth 2 (two) times daily for 7 days.  Dispense: 14 tablet; Refill: 0  Spinal stenosis of lumbar region, unspecified whether neurogenic claudication present  Uncontrolled type 2 diabetes mellitus with hyperglycemia (HCC)    No orders of the defined types were placed in this encounter. Pt with URI and midsternal pain. Treat with Augmentin 875mg  BID along with sending for CXR to rule out  Pneumonia. Explained to pt his hip pain and weakness is likely consequence of spinal lumbar stenosis. To continue with PT and follow up with sports medicine provider as scheduled. His dry mouth has been present for years and he mentions this almost every visit. Explained to pt that dry mouth is likely a consequence of uncontrolled diabetes. He's had an A1c of 9 and above for the last 6 years. He voiced understanding.  Will follow up on Sept 10 as scheduled.   No follow-ups on file.  Suzan Slick, MD

## 2023-07-15 NOTE — Telephone Encounter (Signed)
-----   Message from Suzan Slick sent at 07/15/2023  2:57 PM EDT ----- Please inform pt his chest xray was normal.

## 2023-07-22 ENCOUNTER — Other Ambulatory Visit: Payer: Self-pay | Admitting: Family Medicine

## 2023-07-22 ENCOUNTER — Other Ambulatory Visit (HOSPITAL_BASED_OUTPATIENT_CLINIC_OR_DEPARTMENT_OTHER): Payer: Self-pay

## 2023-07-22 DIAGNOSIS — E1165 Type 2 diabetes mellitus with hyperglycemia: Secondary | ICD-10-CM

## 2023-07-22 MED ORDER — DAPAGLIFLOZIN PROPANEDIOL 10 MG PO TABS
10.0000 mg | ORAL_TABLET | Freq: Every morning | ORAL | 1 refills | Status: DC
Start: 2023-07-22 — End: 2024-09-07
  Filled 2023-07-22 – 2023-07-29 (×2): qty 90, 90d supply, fill #0
  Filled 2023-10-09 – 2023-10-12 (×2): qty 90, 90d supply, fill #1

## 2023-07-22 MED ORDER — KETOCONAZOLE 2 % EX CREA
1.0000 | TOPICAL_CREAM | Freq: Every day | CUTANEOUS | 0 refills | Status: DC
  Filled 2023-07-22: qty 90, 30d supply, fill #0

## 2023-07-29 ENCOUNTER — Other Ambulatory Visit (HOSPITAL_BASED_OUTPATIENT_CLINIC_OR_DEPARTMENT_OTHER): Payer: Self-pay

## 2023-08-04 ENCOUNTER — Other Ambulatory Visit (HOSPITAL_BASED_OUTPATIENT_CLINIC_OR_DEPARTMENT_OTHER): Payer: Self-pay

## 2023-08-04 ENCOUNTER — Encounter: Payer: Self-pay | Admitting: Family Medicine

## 2023-08-04 ENCOUNTER — Ambulatory Visit (INDEPENDENT_AMBULATORY_CARE_PROVIDER_SITE_OTHER): Payer: Commercial Managed Care - PPO | Admitting: Family Medicine

## 2023-08-04 VITALS — BP 124/79 | HR 88 | Temp 98.4°F | Resp 18 | Ht 75.0 in | Wt 227.2 lb

## 2023-08-04 DIAGNOSIS — Z7984 Long term (current) use of oral hypoglycemic drugs: Secondary | ICD-10-CM

## 2023-08-04 DIAGNOSIS — E1165 Type 2 diabetes mellitus with hyperglycemia: Secondary | ICD-10-CM | POA: Diagnosis not present

## 2023-08-04 DIAGNOSIS — I10 Essential (primary) hypertension: Secondary | ICD-10-CM

## 2023-08-04 DIAGNOSIS — Z1211 Encounter for screening for malignant neoplasm of colon: Secondary | ICD-10-CM

## 2023-08-04 DIAGNOSIS — J309 Allergic rhinitis, unspecified: Secondary | ICD-10-CM

## 2023-08-04 DIAGNOSIS — K219 Gastro-esophageal reflux disease without esophagitis: Secondary | ICD-10-CM | POA: Diagnosis not present

## 2023-08-04 LAB — POCT GLYCOSYLATED HEMOGLOBIN (HGB A1C): Hemoglobin A1C: 7.6 % — AB (ref 4.0–5.6)

## 2023-08-04 MED ORDER — GLUCOTROL XL 5 MG PO TB24
5.0000 mg | ORAL_TABLET | Freq: Two times a day (BID) | ORAL | 1 refills | Status: DC
Start: 2023-08-04 — End: 2023-09-22
  Filled 2023-08-04: qty 180, 90d supply, fill #0
  Filled 2023-08-26: qty 60, 30d supply, fill #0

## 2023-08-04 NOTE — Progress Notes (Signed)
Established Patient Office Visit  Subjective   Patient ID: Jamie Foster, male    DOB: Dec 07, 1965  Age: 57 y.o. MRN: [id number]  Chief Complaint  Patient presents with   Diabetes   Hypertension    Diabetes Pertinent negatives for diabetes include no chest pain.  Hypertension Pertinent negatives include no chest pain or shortness of breath.   Hypertension Taking Losartan/hydrochlorothiazide 100-25mg  daily for HTN. Takes in the morning. Blood pressures have been much better.  Diabetes Pt has been taking [place] 10mg  daily, Glucotrol 5mg  BID, Lantus 18 units a day, and Ozempic 1mg  weekly. Checking sugars at home and this morning was 133.   Throat pain/GERD Pt reports he's been SOB for years. He reports laughing and talking makes his SOB worse. He has extreme pain in the back of his throat. He also reports bilateral ear pain. He reports when he was taking Augmentin the last visit, it helped relieve the symptoms but now they are back. He says when he talks, it feels like his throat is closing up. He reports he has seen ENT in the last 2 years and had scope done. He says the report of this scope was normal. He was advised that he has laryngopharyngeal reflux. He was told to take PPI BID. He reports he's been doing this OTC.  I am unable to look at the results. Pt also reports abdominal cramping and feels like he's being squeezed at his midsternal area. He has belching but reports this has been present since taking Ozempic. He also asks about his colonoscopy because last visit was in 2017.  He is taking Singulair for allergies but reports any nasal steroid spray he takes, he falls asleep during the day with.    Review of Systems  HENT:  Positive for sore throat. Negative for congestion and nosebleeds.   Respiratory:  Negative for cough, sputum production and shortness of breath.   Cardiovascular:  Negative for chest pain.  Gastrointestinal:  Positive for abdominal pain.  All other  systems reviewed and are negative.    Objective:     BP 124/79   Pulse 88   Temp 98.4 F (36.9 C) (Oral)   Resp 18   Ht 6\' 3"  (1.905 m)   Wt 227 lb 3.2 oz (103.1 kg)   SpO2 99%   BMI 28.40 kg/m  BP Readings from Last 3 Encounters:  08/04/23 124/79  07/15/23 130/75  05/04/23 (!) 154/85      Physical Exam Vitals and nursing note reviewed.  Constitutional:      Appearance: Normal appearance. He is normal weight.  HENT:     Head: Normocephalic and atraumatic.     Right Ear: External ear normal.     Left Ear: External ear normal.     Nose: Nose normal.     Mouth/Throat:     Mouth: Mucous membranes are moist.     Pharynx: Oropharynx is clear.  Eyes:     Conjunctiva/sclera: Conjunctivae normal.     Pupils: Pupils are equal, round, and reactive to light.  Cardiovascular:     Rate and Rhythm: Normal rate and regular rhythm.     Pulses: Normal pulses.     Heart sounds: Normal heart sounds.  Pulmonary:     Effort: Pulmonary effort is normal.     Breath sounds: Normal breath sounds.  Abdominal:     General: Abdomen is flat. Bowel sounds are normal.  Skin:    General: Skin is warm.  Capillary Refill: Capillary refill takes less than 2 seconds.  Neurological:     General: No focal deficit present.     Mental Status: He is alert and oriented to person, place, and time. Mental status is at baseline.  Psychiatric:        Mood and Affect: Mood normal.        Behavior: Behavior normal.        Thought Content: Thought content normal.        Judgment: Judgment normal.    No results found for any visits on 08/04/23.  Last CBC Lab Results  Component Value Date   WBC 4.9 02/19/2023   HGB 15.2 02/19/2023   HCT 45.5 02/19/2023   MCV 92 02/19/2023   MCH 30.8 02/19/2023   RDW 11.6 02/19/2023   PLT 296 02/19/2023   Last metabolic panel Lab Results  Component Value Date   GLUCOSE 181 (H) 02/19/2023   NA 135 02/19/2023   K 4.5 02/19/2023   CL 99 02/19/2023   CO2 25  02/19/2023   BUN 15 02/19/2023   CREATININE 1.23 02/19/2023   EGFR 68 02/19/2023   CALCIUM 9.7 02/19/2023   PROT 6.8 02/19/2023   ALBUMIN 4.3 02/19/2023   LABGLOB 2.5 02/19/2023   AGRATIO 1.7 02/19/2023   BILITOT 0.4 02/19/2023   ALKPHOS 66 02/19/2023   AST 17 02/19/2023   ALT 20 02/19/2023   Last hemoglobin A1c Lab Results  Component Value Date   HGBA1C 7.6 (A) 08/04/2023      The 10-year ASCVD risk score (Arnett DK, et al., 2019) is: 20.8%    Assessment & Plan:   Problem List Items Addressed This Visit       Cardiovascular and Mediastinum   HTN (hypertension)     Endocrine   Uncontrolled type 2 diabetes mellitus with hyperglycemia (HCC) - Primary   Relevant Orders   POCT glycosylated hemoglobin (Hb A1C)  Uncontrolled type 2 diabetes mellitus with hyperglycemia (HCC) -     POCT glycosylated hemoglobin (Hb A1C) -     Glucotrol XL; Take 1 tablet (5 mg total) by mouth 2 (two) times daily.  Dispense: 180 tablet; Refill: 1 -     Basic metabolic panel  Primary hypertension  LPRD (laryngopharyngeal reflux disease) -     Ambulatory referral to Gastroenterology -     Ambulatory referral to ENT  Screening for colon cancer -     Ambulatory referral to Gastroenterology  Allergic rhinitis, unspecified seasonality, unspecified trigger -     Ambulatory referral to ENT   Diabetes much better. Continue regimen Farxiga 10mg  daily, Glucotrol 5mg  BID, Lantus 18 units a day, and Ozempic 1mg  weekly.  See back in 6 months. Pt needs Glucotrol refilled today.  Blood pressure much better. Continue Losartan/hydrochlorothiazide 100/25mg  daily. Recheck BMP today.  Suspect GERD and allergic rhinitis. Refer to ENT and GI for further treatment options.   No follow-ups on file.    Suzan Slick, MD

## 2023-08-05 LAB — BASIC METABOLIC PANEL
BUN/Creatinine Ratio: 14 (ref 9–20)
BUN: 20 mg/dL (ref 6–24)
CO2: 26 mmol/L (ref 20–29)
Calcium: 9.7 mg/dL (ref 8.7–10.2)
Chloride: 99 mmol/L (ref 96–106)
Creatinine, Ser: 1.42 mg/dL — ABNORMAL HIGH (ref 0.76–1.27)
Glucose: 108 mg/dL — ABNORMAL HIGH (ref 70–99)
Potassium: 4.1 mmol/L (ref 3.5–5.2)
Sodium: 140 mmol/L (ref 134–144)
eGFR: 57 mL/min/{1.73_m2} — ABNORMAL LOW (ref >59)

## 2023-08-06 ENCOUNTER — Telehealth: Payer: Self-pay

## 2023-08-06 NOTE — Telephone Encounter (Signed)
LVM for patient to return call at earliest convenience   Left voice message for patient to call back to discuss lab results

## 2023-08-06 NOTE — Telephone Encounter (Signed)
-----   Message from Suzan Slick sent at 08/05/2023  8:16 AM EDT ----- Your Kidney function has declined some which means there are signs of dehydration. The blood pressure medicine that you were concerned with, could possibly be adding to the dehydration. There is a diuretic component within the blood pressure medicine that I can remove. Instead of taking Losartan/hydrochlorothiazide, I can just have you on Losartan for blood pressure. Let me know your thoughts.

## 2023-08-07 ENCOUNTER — Other Ambulatory Visit (HOSPITAL_BASED_OUTPATIENT_CLINIC_OR_DEPARTMENT_OTHER): Payer: Self-pay

## 2023-08-26 ENCOUNTER — Other Ambulatory Visit (HOSPITAL_BASED_OUTPATIENT_CLINIC_OR_DEPARTMENT_OTHER): Payer: Self-pay

## 2023-08-26 ENCOUNTER — Telehealth: Payer: Self-pay

## 2023-08-26 ENCOUNTER — Other Ambulatory Visit: Payer: Self-pay | Admitting: Family Medicine

## 2023-08-26 DIAGNOSIS — I10 Essential (primary) hypertension: Secondary | ICD-10-CM

## 2023-08-26 DIAGNOSIS — J309 Allergic rhinitis, unspecified: Secondary | ICD-10-CM

## 2023-08-26 DIAGNOSIS — E782 Mixed hyperlipidemia: Secondary | ICD-10-CM

## 2023-08-26 MED ORDER — TRIAMCINOLONE ACETONIDE 55 MCG/ACT NA AERO
INHALATION_SPRAY | NASAL | 2 refills | Status: DC
Start: 2023-08-26 — End: 2024-09-07
  Filled 2023-08-26: qty 33.8, 30d supply, fill #0

## 2023-08-26 MED ORDER — COZAAR 100 MG PO TABS
100.0000 mg | ORAL_TABLET | Freq: Every day | ORAL | 1 refills | Status: DC
Start: 2023-08-26 — End: 2023-08-26
  Filled 2023-08-26: qty 90, 90d supply, fill #0

## 2023-08-26 MED ORDER — LOSARTAN POTASSIUM 100 MG PO TABS
100.0000 mg | ORAL_TABLET | Freq: Every day | ORAL | 1 refills | Status: DC
Start: 2023-08-26 — End: 2023-09-22
  Filled 2023-08-26: qty 90, 90d supply, fill #0

## 2023-08-26 NOTE — Telephone Encounter (Signed)
Received below message from the pharmacist: Good afternoon Mr Schweikert is now asking for generic losartan but the [facility] script has dispense as name brand so we need a new order for losartan 100 mg  I know he was in the past, but he just called and said aurobindo brand losartan works good for him and that's what he want especially after I told him his copay for cozaar was $485  Sent Losartan 100mg  to pharmacy

## 2023-08-26 NOTE — Telephone Encounter (Signed)
The Glucotrol is the brand name. This has been sent in. I only send in brand names of his medications. The Singulair is to be done at night. I've added him on the nose spray to use during the day when his symptoms worsens. Sent this to [facility].

## 2023-08-26 NOTE — Telephone Encounter (Signed)
Sent brand name Cozaar (losartan) into [facility]. To monitor blood pressures outside the office to make sure it's controlled since we are taking away the hydrochlorothiazide.

## 2023-08-26 NOTE — Addendum Note (Signed)
Addended by: Suzan Slick on: 08/26/2023 03:25 PM   Modules accepted: Orders

## 2023-08-26 NOTE — Telephone Encounter (Signed)
Patient also called to state he would like to be prescribed the BP medication (losartan) without the hydrochlorothiazide, as discussed in prior lab result message

## 2023-08-26 NOTE — Telephone Encounter (Signed)
Patient called requesting that he would like the Glucontrol brand name sent in to his pharmacy, he also wanted to know if he could get a prescription for his allergies that last longer than a few hours, he states that he is ok during the day, but by the afternoon he begins to get very congested and he feels as though the allergy medication has worn off .

## 2023-08-27 ENCOUNTER — Other Ambulatory Visit (HOSPITAL_BASED_OUTPATIENT_CLINIC_OR_DEPARTMENT_OTHER): Payer: Self-pay

## 2023-08-27 MED ORDER — COVID-19 MRNA VAC-TRIS(PFIZER) 30 MCG/0.3ML IM SUSY
0.3000 mL | PREFILLED_SYRINGE | Freq: Once | INTRAMUSCULAR | 0 refills | Status: AC
Start: 2023-08-27 — End: 2023-08-28
  Filled 2023-08-27: qty 0.3, 1d supply, fill #0

## 2023-08-27 MED ORDER — ROSUVASTATIN CALCIUM 10 MG PO TABS
10.0000 mg | ORAL_TABLET | Freq: Every day | ORAL | 1 refills | Status: DC
  Filled 2023-08-27: qty 90, 90d supply, fill #0
  Filled 2023-10-09 (×2): qty 90, 90d supply, fill #1

## 2023-08-28 ENCOUNTER — Other Ambulatory Visit (HOSPITAL_BASED_OUTPATIENT_CLINIC_OR_DEPARTMENT_OTHER): Payer: Self-pay

## 2023-09-04 ENCOUNTER — Other Ambulatory Visit: Payer: Self-pay | Admitting: Family Medicine

## 2023-09-04 ENCOUNTER — Other Ambulatory Visit (INDEPENDENT_AMBULATORY_CARE_PROVIDER_SITE_OTHER): Payer: Self-pay

## 2023-09-04 ENCOUNTER — Other Ambulatory Visit (HOSPITAL_BASED_OUTPATIENT_CLINIC_OR_DEPARTMENT_OTHER): Payer: Self-pay

## 2023-09-04 DIAGNOSIS — J309 Allergic rhinitis, unspecified: Secondary | ICD-10-CM

## 2023-09-07 ENCOUNTER — Other Ambulatory Visit (HOSPITAL_BASED_OUTPATIENT_CLINIC_OR_DEPARTMENT_OTHER): Payer: Self-pay

## 2023-09-07 MED ORDER — MONTELUKAST SODIUM 10 MG PO TABS
10.0000 mg | ORAL_TABLET | Freq: Every day | ORAL | 1 refills | Status: DC
Start: 2023-09-07 — End: 2023-09-22
  Filled 2023-09-07: qty 30, 30d supply, fill #0

## 2023-09-08 DIAGNOSIS — H35043 Retinal micro-aneurysms, unspecified, bilateral: Secondary | ICD-10-CM | POA: Diagnosis not present

## 2023-09-08 DIAGNOSIS — H43393 Other vitreous opacities, bilateral: Secondary | ICD-10-CM | POA: Diagnosis not present

## 2023-09-08 DIAGNOSIS — H5203 Hypermetropia, bilateral: Secondary | ICD-10-CM | POA: Diagnosis not present

## 2023-09-08 DIAGNOSIS — H2513 Age-related nuclear cataract, bilateral: Secondary | ICD-10-CM | POA: Diagnosis not present

## 2023-09-08 DIAGNOSIS — E113213 Type 2 diabetes mellitus with mild nonproliferative diabetic retinopathy with macular edema, bilateral: Secondary | ICD-10-CM | POA: Diagnosis not present

## 2023-09-08 DIAGNOSIS — H524 Presbyopia: Secondary | ICD-10-CM | POA: Diagnosis not present

## 2023-09-08 DIAGNOSIS — Z7984 Long term (current) use of oral hypoglycemic drugs: Secondary | ICD-10-CM | POA: Diagnosis not present

## 2023-09-08 DIAGNOSIS — H52203 Unspecified astigmatism, bilateral: Secondary | ICD-10-CM | POA: Diagnosis not present

## 2023-09-08 LAB — HM DIABETES EYE EXAM

## 2023-09-16 ENCOUNTER — Other Ambulatory Visit: Payer: Self-pay

## 2023-09-22 ENCOUNTER — Telehealth: Payer: Self-pay

## 2023-09-22 ENCOUNTER — Other Ambulatory Visit (HOSPITAL_BASED_OUTPATIENT_CLINIC_OR_DEPARTMENT_OTHER): Payer: Self-pay

## 2023-09-22 DIAGNOSIS — I10 Essential (primary) hypertension: Secondary | ICD-10-CM

## 2023-09-22 DIAGNOSIS — J309 Allergic rhinitis, unspecified: Secondary | ICD-10-CM

## 2023-09-22 DIAGNOSIS — E1165 Type 2 diabetes mellitus with hyperglycemia: Secondary | ICD-10-CM

## 2023-09-22 MED ORDER — GLUCOTROL XL 5 MG PO TB24
5.0000 mg | ORAL_TABLET | Freq: Two times a day (BID) | ORAL | 1 refills | Status: DC
  Filled 2023-09-22: qty 180, 90d supply, fill #0
  Filled 2023-10-09: qty 40, 20d supply, fill #0

## 2023-09-22 MED ORDER — LOSARTAN POTASSIUM 100 MG PO TABS
100.0000 mg | ORAL_TABLET | Freq: Every day | ORAL | 1 refills | Status: DC
  Filled 2023-09-22 – 2023-10-09 (×3): qty 90, 90d supply, fill #0
  Filled 2024-02-16: qty 90, 90d supply, fill #1

## 2023-09-22 MED ORDER — MONTELUKAST SODIUM 10 MG PO TABS
10.0000 mg | ORAL_TABLET | Freq: Every day | ORAL | 1 refills | Status: DC
  Filled 2023-09-22 – 2023-10-12 (×3): qty 90, 90d supply, fill #0
  Filled 2024-04-29: qty 90, 90d supply, fill #1

## 2023-09-22 NOTE — Telephone Encounter (Signed)
Patient called and states that he would like a 90 day prescription on his GLUCOTROL XL 5 MG 24 hr tablet , montelukast (SINGULAIR) 10 MG tablet ,  losartan (COZAAR) 100 MG tablet   Her states that in November he will be going out of the country and will be returning in February 2025   Please advise

## 2023-09-22 NOTE — Telephone Encounter (Signed)
Refilled requested medications for 90 days as pt will be out of the country. I have placed note on the prescriptions asking them to fill early due to him leaving the country.

## 2023-09-23 ENCOUNTER — Institutional Professional Consult (permissible substitution) (INDEPENDENT_AMBULATORY_CARE_PROVIDER_SITE_OTHER): Payer: Commercial Managed Care - PPO

## 2023-09-23 NOTE — Progress Notes (Deleted)
Dear Dr. Wyline Mood, Here is my assessment for our mutual patient, Jamie Foster. Thank you for allowing me the opportunity to care for your patient. Please do not hesitate to contact me should you have any other questions. Sincerely, Dr. Jovita Kussmaul  Otolaryngology Clinic Note Referring provider: Dr. Wyline Mood HPI:  Jamie Foster is a 57 y.o. male kindly referred by Dr. Wyline Mood for evaluation of ***.   Pt reports he's been SOB for years. He reports laughing and talking makes his SOB worse. He has extreme pain in the back of his throat. He also reports bilateral ear pain. He reports when he was taking Augmentin the last visit, it helped relieve the symptoms but now they are back. He says when he talks, it feels like his throat is closing up. He reports he has seen ENT in the last 2 years and had scope done. He says the report of this scope was normal. He was advised that he has laryngopharyngeal reflux. He was told to take PPI BID. He reports he's been doing this OTC.  I am unable to look at the results. Pt also reports abdominal cramping and feels like he's being squeezed at his midsternal area. He has belching but reports this has been present since taking Ozempic. He also asks about his colonoscopy because last visit was in 2017.  He is taking Singulair for allergies but reports any nasal steroid spray he takes, he falls asleep during the day with.  Did get augmentin his past summer for sore throat/URI - with some improvement in overall sx H&N Surgery: Sinus surgery (2003 reportedly per patient) Personal or FHx of bleeding dz or anesthesia difficulty: no PMHx: HTN, Diabetes, OSA, AR GLP-1: Yes (Ozempic)  Independent Review of Additional Tests or Records:  PCP notes reviewed Cough visit and URI 02/2023, 12/2022 and 06/2023 respectively Has seen GI 2024: Per their notes: "He was was seen by ENT (Dr. Ernestene Kiel) on 07/24/22 for a variety of complaints, including GERD/Heartburn in spite of taking omeprazole 40mg   qPM. Following OV pt was instructed to take BID PPI and given behavior modifications, and was referred for consultation with me today for consideration of EGD.  Prior ENTs: Dr. Windle Guard, [facility] and Dr. Allene Pyo [phone]) notes reviewed Dr. Harvie Junior notes: "In addition the patient has longstanding history of GERD/heartburn. He endorses having heartburn almost daily. He was prescribed omeprazole 40 mg nightly by Dr. Ezzard Standing but states it did not help so he stopped taking it. He notes every morning he has significant phlegm and mucus which he spits up and as well as voice hoarseness. He endorses significant dry throat as well as occasional cough. He notes uvular irritation. He denies vomiting, regurgitation of undigested food, odynophagia, dysphagia. He states that he has been prescribed topical nasal sprays in the past like Nasacort and Flonase which help "break up the mucus". However he does not tolerate the nasal sprays well because they make him drowsy. Notably he has never seen gastroenterology nor has he had an esophagogastroduodenoscopy. "  CXR Aug 2021: reviewed independently; no consolidation or other large lung nodules noted  PMH/Meds/All/SocHx/FamHx/ROS:   Past Medical History:  Diagnosis Date   Allergy    Diabetes mellitus without complication (HCC)    H pylori ulcer    Hyperlipidemia    Hypertension    Kidney stone    Sleep apnea      Past Surgical History:  Procedure Laterality Date   NASAL SINUS SURGERY     deviated septum  Family History  Problem Relation Age of Onset   Hypertension Mother    Prostate cancer Father    Colon cancer Neg Hx    Colon polyps Neg Hx      Social Connections: Unknown (02/24/2023)   Received [facility]   Social Network    Social Network: Not on file    Tobacco: ***. Alcohol: ***. Occupation: ***. Lives in *** with ***.   Current Outpatient Medications:    albuterol (VENTOLIN HFA) 108 (90 Base) MCG/ACT inhaler, Inhale 2 puffs into  the lungs every 6 (six) hours as needed for wheezing or shortness of breath., Disp: 6.7 g, Rfl: 0   atorvastatin (LIPITOR) 10 MG tablet, Take 1 tablet by mouth every morning, Disp: 90 tablet, Rfl: 1   Continuous Glucose Sensor (DEXCOM G7 SENSOR) MISC, Apply 1 sensor to skin as directed then change every 10 days, Disp: 9 each, Rfl: 1   Continuous Glucose Sensor (FREESTYLE LIBRE 2 SENSOR) MISC, Apply 1 sensor once every 14 days, Disp: 7 each, Rfl: 4   dapagliflozin propanediol (FARXIGA) 10 MG TABS tablet, Take 1 tablet by mouth every morning, Disp: 90 tablet, Rfl: 1   glucose blood (TRUETRACK TEST) test strip, Use 2x a day, Disp: 100 each, Rfl: 5   GLUCOTROL XL 5 MG 24 hr tablet, Take 1 tablet (5 mg total) by mouth 2 (two) times daily., Disp: 180 tablet, Rfl: 1   insulin glargine (LANTUS SOLOSTAR) 100 UNIT/ML Solostar Pen, Inject 18 Units into the skin at bedtime., Disp: 15 mL, Rfl: 11   Insulin Pen Needle 32G X 4 MM MISC, Use 1x a day, Disp: 100 each, Rfl: 3   ketoconazole (NIZORAL) 2 % cream, Apply liberally to affected area once a day., Disp: 90 g, Rfl: 0   losartan (COZAAR) 100 MG tablet, Take 1 tablet (100 mg total) by mouth daily., Disp: 90 tablet, Rfl: 1   montelukast (SINGULAIR) 10 MG tablet, Take 1 tablet (10 mg total) by mouth at bedtime., Disp: 90 tablet, Rfl: 1   neomycin-polymyxin-hydrocortisone (CORTISPORIN) 3.5-10000-1 OTIC suspension, Place 3 drops into the left ear 4 (four) times daily., Disp: 10 mL, Rfl: 1   Olopatadine HCl 0.2 % SOLN, Apply 1 drop to eye daily as needed., Disp: 2.5 mL, Rfl: 1   ONETOUCH DELICA LANCETS 33G MISC, Use to test blood sugar 2 times daily as instructed., Disp: 100 each, Rfl: 11   rosuvastatin (CRESTOR) 10 MG tablet, Take 1 tablet (10 mg total) by mouth daily., Disp: 90 tablet, Rfl: 1   Semaglutide, 1 MG/DOSE, (OZEMPIC, 1 MG/DOSE,) 4 MG/3ML SOPN, Inject 1 mg into the skin once a week., Disp: 9 mL, Rfl: 1   triamcinolone (NASACORT) 55 MCG/ACT AERO nasal  inhaler, Place 2 squirts in each nostril at night before bedtime, Disp: 33.8 mL, Rfl: 2   [place] LANCETS MISC, Use 2x a day - Tru Track, Disp: 200 each, Rfl: 5   Physical Exam:   There were no vitals taken for this visit. ***  Salient findings:  CN II-XII intact *** Bilateral EAC clear and TM intact with well pneumatized middle ear spaces Weber 512: *** Rinne 512: AC > BC b/l *** Rine 1024: AC > BC b/l *** Anterior rhinoscopy: Septum ***; bilateral inferior turbinates with *** No lesions of oral cavity/oropharynx; dentition *** No obviously palpable neck masses/lymphadenopathy/thyromegaly No respiratory distress or stridor***  Procedures:  None***  Impression & Plans:  Jamie Foster is a 57 y.o. male with ***   -  f/u ***   Thank you for allowing me the opportunity to care for your patient. Please do not hesitate to contact me should you have any other questions.  Sincerely, Jovita Kussmaul, MD Otolarynoglogist (ENT), [facility] Phone: [phone] Fax: [phone]  09/23/2023, 9:27 AM

## 2023-10-09 ENCOUNTER — Other Ambulatory Visit: Payer: Self-pay | Admitting: Family Medicine

## 2023-10-09 ENCOUNTER — Encounter (HOSPITAL_BASED_OUTPATIENT_CLINIC_OR_DEPARTMENT_OTHER): Payer: Self-pay

## 2023-10-09 ENCOUNTER — Other Ambulatory Visit: Payer: Self-pay

## 2023-10-09 ENCOUNTER — Other Ambulatory Visit (HOSPITAL_BASED_OUTPATIENT_CLINIC_OR_DEPARTMENT_OTHER): Payer: Self-pay

## 2023-10-09 DIAGNOSIS — E1165 Type 2 diabetes mellitus with hyperglycemia: Secondary | ICD-10-CM

## 2023-10-09 MED ORDER — OMEPRAZOLE 40 MG PO CPDR
DELAYED_RELEASE_CAPSULE | ORAL | 4 refills | Status: DC
  Filled 2023-10-09: qty 90, 90d supply, fill #0

## 2023-10-09 MED ORDER — KETOCONAZOLE 2 % EX CREA
1.0000 | TOPICAL_CREAM | Freq: Every day | CUTANEOUS | 0 refills | Status: DC
  Filled 2023-10-09: qty 90, 90d supply, fill #0

## 2023-10-10 ENCOUNTER — Other Ambulatory Visit (HOSPITAL_BASED_OUTPATIENT_CLINIC_OR_DEPARTMENT_OTHER): Payer: Self-pay

## 2023-10-12 ENCOUNTER — Telehealth: Payer: Self-pay

## 2023-10-12 ENCOUNTER — Other Ambulatory Visit (HOSPITAL_BASED_OUTPATIENT_CLINIC_OR_DEPARTMENT_OTHER): Payer: Self-pay

## 2023-10-12 ENCOUNTER — Other Ambulatory Visit: Payer: Self-pay

## 2023-10-12 DIAGNOSIS — E1165 Type 2 diabetes mellitus with hyperglycemia: Secondary | ICD-10-CM

## 2023-10-12 MED ORDER — FREESTYLE LIBRE 3 PLUS SENSOR MISC: 1.0000 | 1 refills | Status: AC

## 2023-10-12 MED ORDER — INSULIN PEN NEEDLE 32G X 4 MM MISC: 1.0000 | Freq: Every day | 3 refills | Status: DC

## 2023-10-12 MED ORDER — OZEMPIC (1 MG/DOSE) 4 MG/3ML ~~LOC~~ SOPN
1.0000 mg | PEN_INJECTOR | SUBCUTANEOUS | 1 refills | Status: DC
  Filled 2023-10-12: qty 9, 84d supply, fill #0
  Filled 2024-01-07: qty 9, 84d supply, fill #1

## 2023-10-12 MED ORDER — GLUCOTROL XL 5 MG PO TB24: 5.0000 mg | ORAL_TABLET | Freq: Two times a day (BID) | ORAL | 1 refills | Status: DC

## 2023-10-12 MED ORDER — INSULIN PEN NEEDLE 32G X 4 MM MISC
1.0000 | Freq: Every day | 3 refills | Status: DC
  Filled 2023-10-12: qty 100, 90d supply, fill #0

## 2023-10-12 MED ORDER — FREESTYLE LIBRE 3 PLUS SENSOR MISC
1.0000 | 1 refills | Status: DC
  Filled 2023-10-12: qty 2, 28d supply, fill #0

## 2023-10-12 NOTE — Telephone Encounter (Signed)
Patient calling in to request refill of this medication and needles + Freestyle Libre 3 Sensor (for 90 days) with reader to pair with it if possible. Jamie Foster

## 2023-10-12 NOTE — Telephone Encounter (Signed)
Patient returned call and states that he does not want the generic he would like for the medication ( Glucotrol) to be sent to [facility] on

## 2023-10-12 NOTE — Telephone Encounter (Signed)
Resent all requested medications to [facility]

## 2023-10-12 NOTE — Addendum Note (Signed)
Addended by: Suzan Slick on: 10/12/2023 03:25 PM   Modules accepted: Orders

## 2023-10-12 NOTE — Telephone Encounter (Signed)
Refilled Freestyle libre 3 with sensos and needles to go with his insulin.  Please also relay the below unread mychart message about the Glucotrol being discontinued and the pharmacy is inquiring if it's ok to change to generic Glipizide? If so I'll send. Pt always has requested brand name in the past.

## 2023-10-12 NOTE — Telephone Encounter (Signed)
Noted and aware.

## 2023-10-12 NOTE — Telephone Encounter (Signed)
Spoke with patient and he states that he does not want the Glipizide he wants the Glucotrol, he would like it sent to the [facility] in his chart . As well as the [facility] # [id number] at [facility] of [address] and [employer]

## 2023-10-12 NOTE — Telephone Encounter (Signed)
LVM for patient to return call at earliest convenience    RE: Left voice message for patient to return call to discuss medication

## 2023-10-13 ENCOUNTER — Other Ambulatory Visit (HOSPITAL_BASED_OUTPATIENT_CLINIC_OR_DEPARTMENT_OTHER): Payer: Self-pay

## 2024-01-06 ENCOUNTER — Other Ambulatory Visit (HOSPITAL_BASED_OUTPATIENT_CLINIC_OR_DEPARTMENT_OTHER): Payer: Self-pay

## 2024-01-06 ENCOUNTER — Ambulatory Visit: Payer: Commercial Managed Care - PPO | Admitting: Family Medicine

## 2024-01-06 ENCOUNTER — Ambulatory Visit (HOSPITAL_BASED_OUTPATIENT_CLINIC_OR_DEPARTMENT_OTHER)
Admission: RE | Admit: 2024-01-06 | Discharge: 2024-01-06 | Disposition: A | Discharge status: Home / Self Care | Payer: Commercial Managed Care - PPO | Source: Ambulatory Visit | Attending: Family Medicine | Admitting: Family Medicine

## 2024-01-06 ENCOUNTER — Encounter: Payer: Self-pay | Admitting: Family Medicine

## 2024-01-06 VITALS — BP 178/96 | HR 104 | Temp 98.0°F | Resp 18 | Ht 75.0 in | Wt 220.9 lb

## 2024-01-06 DIAGNOSIS — Z7985 Long-term (current) use of injectable non-insulin antidiabetic drugs: Secondary | ICD-10-CM | POA: Diagnosis not present

## 2024-01-06 DIAGNOSIS — J069 Acute upper respiratory infection, unspecified: Secondary | ICD-10-CM

## 2024-01-06 DIAGNOSIS — R059 Cough, unspecified: Secondary | ICD-10-CM | POA: Diagnosis not present

## 2024-01-06 DIAGNOSIS — R051 Acute cough: Secondary | ICD-10-CM | POA: Diagnosis not present

## 2024-01-06 DIAGNOSIS — E1165 Type 2 diabetes mellitus with hyperglycemia: Secondary | ICD-10-CM

## 2024-01-06 DIAGNOSIS — Z7984 Long term (current) use of oral hypoglycemic drugs: Secondary | ICD-10-CM

## 2024-01-06 MED ORDER — GLUCOTROL XL 5 MG PO TB24
5.0000 mg | ORAL_TABLET | Freq: Two times a day (BID) | ORAL | 1 refills | Status: DC
  Filled 2024-01-06: qty 180, 90d supply, fill #0
  Filled 2024-03-03 – 2024-04-29 (×4): qty 20, 10d supply, fill #1
  Filled 2024-04-29: qty 180, 90d supply, fill #1

## 2024-01-06 MED ORDER — BENZONATATE 200 MG PO CAPS
200.0000 mg | ORAL_CAPSULE | Freq: Two times a day (BID) | ORAL | 0 refills | Status: DC | PRN
  Filled 2024-01-06: qty 20, 10d supply, fill #0

## 2024-01-06 MED ORDER — AZITHROMYCIN 250 MG PO TABS
ORAL_TABLET | ORAL | 0 refills | Status: AC
  Filled 2024-01-06: qty 6, 5d supply, fill #0

## 2024-01-06 NOTE — Progress Notes (Signed)
Acute Office Visit  Subjective:     Patient ID: Jamie Foster, male    DOB: 16-Mar-1966, 58 y.o.   MRN: [id number]  Chief Complaint  Patient presents with   Cough    Patient states that he has had a cough since Saturday, he states that he has coughed so much and hard its caused his chest to hurt. He also complains of very bad cold chills and body aches, as well as no appetite    Cough Associated symptoms include chest pain, chills and a fever.  Patient is in today for acute visit.  Pt reports he started feeling cold chills on Saturday. On Sunday he began having sharp pains on the right side of his chest. He has had cough that started on Saturday. He also has chills and night sweats since then. He's been taking Nyquil and Dayquil. He also has aches all over his body. He reports his mucus has been dark color. He has pain on the right side with coughing.  Pt also reports he needs his Glucotrol refilled for his diabetes.  Review of Systems  Constitutional:  Positive for chills, fever and malaise/fatigue.  Respiratory:  Positive for cough.   Cardiovascular:  Positive for chest pain.  All other systems reviewed and are negative.      Objective:    BP (!) 178/96   Pulse (!) 104   Temp 98 F (36.7 C) (Oral)   Resp 18   Ht 6\' 3"  (1.905 m)   Wt 220 lb 14.4 oz (100.2 kg)   SpO2 98%   BMI 27.61 kg/m  BP Readings from Last 3 Encounters:  01/06/24 (!) 178/96  08/04/23 124/79  07/15/23 130/75      Physical Exam Vitals and nursing note reviewed.  Constitutional:      General: He is not in acute distress.    Appearance: Normal appearance. He is normal weight. He is ill-appearing. He is not toxic-appearing.  HENT:     Head: Normocephalic and atraumatic.     Right Ear: Tympanic membrane, ear canal and external ear normal.     Left Ear: Tympanic membrane, ear canal and external ear normal.     Nose: Nose normal.     Mouth/Throat:     Mouth: Mucous membranes are moist.      Pharynx: Oropharynx is clear.  Eyes:     Conjunctiva/sclera: Conjunctivae normal.     Pupils: Pupils are equal, round, and reactive to light.  Cardiovascular:     Rate and Rhythm: Normal rate and regular rhythm.     Pulses: Normal pulses.     Heart sounds: Normal heart sounds.  Pulmonary:     Effort: Pulmonary effort is normal.     Breath sounds: Wheezing present.  Skin:    General: Skin is warm.     Capillary Refill: Capillary refill takes less than 2 seconds.  Neurological:     General: No focal deficit present.     Mental Status: He is alert and oriented to person, place, and time. Mental status is at baseline.  Psychiatric:        Mood and Affect: Mood normal.        Behavior: Behavior normal.        Thought Content: Thought content normal.        Judgment: Judgment normal.   Results for orders placed or performed in visit on 01/06/24  HM DIABETES EYE EXAM  Result Value Ref Range   HM  Diabetic Eye Exam Retinopathy (A) No Retinopathy        Assessment & Plan:   Problem List Items Addressed This Visit       Endocrine   Uncontrolled type 2 diabetes mellitus with hyperglycemia (HCC)   Relevant Medications   GLUCOTROL XL 5 MG 24 hr tablet   Other Visit Diagnoses       Acute cough    -  Primary   Relevant Orders   DG Chest 2 View   COVID-19, Flu A+B and RSV     Upper respiratory tract infection, unspecified type       Relevant Medications   azithromycin (ZITHROMAX) 250 MG tablet   benzonatate (TESSALON) 200 MG capsule   Other Relevant Orders   DG Chest 2 View   COVID-19, Flu A+B and RSV       Meds ordered this encounter  Medications   azithromycin (ZITHROMAX) 250 MG tablet    Sig: Take 2 tablets on day 1, then 1 tablet daily on days 2 through 5    Dispense:  6 tablet    Refill:  0   benzonatate (TESSALON) 200 MG capsule    Sig: Take 1 capsule (200 mg total) by mouth 2 (two) times daily as needed for cough.    Dispense:  20 capsule    Refill:  0    GLUCOTROL XL 5 MG 24 hr tablet    Sig: Take 1 tablet (5 mg total) by mouth 2 (two) times daily.    Dispense:  180 tablet    Refill:  1   Due to acute URI symptoms and chest pain, send for xray to rule out pneumonia. Cover empirically with z pack for pneumonia and/or URI. Tessalon perles 200mg  bid prn for cough.  Also to do covid, rsv, flu swab today.  Refilled Glucotrol 5mg  bid to pharmacy.  No follow-ups on file.  Suzan Slick, MD

## 2024-01-07 ENCOUNTER — Telehealth: Payer: Self-pay | Admitting: Family Medicine

## 2024-01-07 ENCOUNTER — Other Ambulatory Visit (HOSPITAL_BASED_OUTPATIENT_CLINIC_OR_DEPARTMENT_OTHER): Payer: Self-pay

## 2024-01-07 DIAGNOSIS — J101 Influenza due to other identified influenza virus with other respiratory manifestations: Secondary | ICD-10-CM

## 2024-01-07 NOTE — Telephone Encounter (Signed)
Pt just seen yesterday. Lab results not resulted yet. Advised pt during office visit; we would call him when results came in .

## 2024-01-07 NOTE — Telephone Encounter (Signed)
Copied from CRM [id number]. Topic: Clinical - Lab/Test Results >> Jan 07, 2024  2:59 PM Suzette B wrote: Reason for CRM: Patient called in to find out his lab results, when patient first called I attempted to reach the office several time due to the patient not wanting to identify himself, patient requested to speak with the provider, I advised the patient that we I worked the offsite office and would be willing to help him if he gave me his name and dob, after not reaching anyone in the office, patient allowed me to identfy him and stated he wanted the results from his labs that he'd taken yesterday, I informed the patient it had not been released of yet, but would send a message to have someone from the office call him at [phone]

## 2024-01-07 NOTE — Telephone Encounter (Signed)
Copied from CRM [id number]. Topic: Clinical - Lab/Test Results >> Jan 07, 2024  3:06 PM Gildardo Pounds wrote: Reason for CRM: Patient wants to discuss lab results. Callback number is [phone]

## 2024-01-08 ENCOUNTER — Other Ambulatory Visit: Payer: Self-pay

## 2024-01-08 ENCOUNTER — Other Ambulatory Visit (HOSPITAL_BASED_OUTPATIENT_CLINIC_OR_DEPARTMENT_OTHER): Payer: Self-pay

## 2024-01-08 LAB — COVID-19, FLU A+B AND RSV
Influenza A, NAA: DETECTED — AB
Influenza B, NAA: NOT DETECTED
RSV, NAA: NOT DETECTED
SARS-CoV-2, NAA: NOT DETECTED

## 2024-01-08 MED ORDER — OSELTAMIVIR PHOSPHATE 75 MG PO CAPS
75.0000 mg | ORAL_CAPSULE | Freq: Two times a day (BID) | ORAL | 0 refills | Status: DC
  Filled 2024-01-08: qty 10, 5d supply, fill #0

## 2024-01-08 MED ORDER — FREESTYLE LIBRE 2 SENSOR MISC
4 refills | Status: DC
  Filled 2024-01-08: qty 6, 84d supply, fill #0

## 2024-01-08 NOTE — Telephone Encounter (Signed)
Test results just returned. Pt is positive for Flu A. Left a detailed message of this positive flu result along with normal CXR. I also advised him on VM that I would send in Tamiflu to [facility]. He is to continue taking the zpack.

## 2024-01-08 NOTE — Addendum Note (Signed)
Addended by: Suzan Slick on: 01/08/2024 07:50 AM   Modules accepted: Orders

## 2024-01-08 NOTE — Telephone Encounter (Signed)
Spoke with patient and he is aware of providers message regarding the results of his Flu test, as well as instructions about medication

## 2024-01-20 NOTE — Telephone Encounter (Signed)
 Copied from CRM [id number]. Topic: Clinical - Medication Question >> Jan 20, 2024  2:46 PM Ivette P wrote: Reason for CRM: Pt wants to ask Dr. Wyline Mood a question about medication, asked if detail could be given. Pt did not want to disclose. Pleas contact pt back [phone]

## 2024-01-22 ENCOUNTER — Ambulatory Visit: Payer: Commercial Managed Care - PPO | Admitting: Family Medicine

## 2024-01-22 ENCOUNTER — Encounter: Payer: Self-pay | Admitting: Family Medicine

## 2024-01-22 ENCOUNTER — Other Ambulatory Visit (HOSPITAL_BASED_OUTPATIENT_CLINIC_OR_DEPARTMENT_OTHER): Payer: Self-pay

## 2024-01-22 ENCOUNTER — Other Ambulatory Visit: Payer: Self-pay

## 2024-01-22 VITALS — BP 156/76 | HR 72 | Temp 98.1°F | Resp 18 | Ht 75.0 in | Wt 222.6 lb

## 2024-01-22 DIAGNOSIS — J069 Acute upper respiratory infection, unspecified: Secondary | ICD-10-CM

## 2024-01-22 DIAGNOSIS — J101 Influenza due to other identified influenza virus with other respiratory manifestations: Secondary | ICD-10-CM | POA: Diagnosis not present

## 2024-01-22 DIAGNOSIS — E782 Mixed hyperlipidemia: Secondary | ICD-10-CM

## 2024-01-22 DIAGNOSIS — K219 Gastro-esophageal reflux disease without esophagitis: Secondary | ICD-10-CM | POA: Diagnosis not present

## 2024-01-22 MED ORDER — ALBUTEROL SULFATE HFA 108 (90 BASE) MCG/ACT IN AERS
2.0000 | INHALATION_SPRAY | Freq: Four times a day (QID) | RESPIRATORY_TRACT | 0 refills | Status: DC | PRN
  Filled 2024-01-22: qty 6.7, 25d supply, fill #0

## 2024-01-22 MED ORDER — ROSUVASTATIN CALCIUM 10 MG PO TABS
10.0000 mg | ORAL_TABLET | Freq: Every day | ORAL | 1 refills | Status: DC
  Filled 2024-01-22: qty 90, 90d supply, fill #0
  Filled 2024-04-29: qty 90, 90d supply, fill #1

## 2024-01-22 MED ORDER — OMEPRAZOLE 40 MG PO CPDR
40.0000 mg | DELAYED_RELEASE_CAPSULE | Freq: Two times a day (BID) | ORAL | 1 refills | Status: DC
  Filled 2024-01-22: qty 180, 90d supply, fill #0
  Filled 2024-04-29: qty 180, 90d supply, fill #1

## 2024-01-22 NOTE — Progress Notes (Signed)
 Established Patient Office Visit  Subjective   Patient ID: Jamie Foster, male    DOB: 01-06-1966  Age: 58 y.o. MRN: [id number]  Chief Complaint  Patient presents with   Chest Pain    Patient states that he has been having right sided chest pain since being sick, he states that when he goes to inhale or exhale or even laugh his chest hurts only on the right side.     Chest Pain  Associated symptoms include shortness of breath.    URI/SOB Pt reports when he was sick, he stopped taking all of his chronic medicines except his Glucotrol and Losartan because he was sick and didn't have an appetite due to being sick with the flu on 2/12. He reports he only took the Z pack, Tamiflu, and Tesslon perles prn that was prescribed to him. He says he's been having chest pain on the right side when breathing in or laughing. He still has lingering cough from the flu.   GERD Pt with hx of GERD. He reports continued uvula swelling. He has been out of his Omeprazole and request more to be sent in.   HLD  Pt has been on Crestor 10mg . Needs this refilled. He had another statin on his med list. He reports he isn't taking this. He says the pharmacy contacted his previous PCP to get refilled.  Review of Systems  Respiratory:  Positive for shortness of breath.   Cardiovascular:  Positive for chest pain.  All other systems reviewed and are negative.     Objective:     BP (!) 156/76   Pulse 72   Temp 98.1 F (36.7 C) (Oral)   Resp 18   Ht 6\' 3"  (1.905 m)   Wt 222 lb 9.6 oz (101 kg)   SpO2 98%   BMI 27.82 kg/m  BP Readings from Last 3 Encounters:  01/22/24 (!) 156/76  01/06/24 (!) 178/96  08/04/23 124/79      Physical Exam Vitals and nursing note reviewed.  Constitutional:      Appearance: Normal appearance. He is normal weight.  HENT:     Head: Normocephalic and atraumatic.     Right Ear: External ear normal.     Left Ear: External ear normal.     Nose: Nose normal.      Mouth/Throat:     Mouth: Mucous membranes are moist.     Pharynx: Oropharynx is clear.  Eyes:     Conjunctiva/sclera: Conjunctivae normal.     Pupils: Pupils are equal, round, and reactive to light.  Cardiovascular:     Rate and Rhythm: Normal rate and regular rhythm.     Pulses: Normal pulses.     Heart sounds: Normal heart sounds.  Pulmonary:     Effort: Pulmonary effort is normal.     Breath sounds: Normal breath sounds.  Skin:    General: Skin is warm.     Capillary Refill: Capillary refill takes less than 2 seconds.  Neurological:     General: No focal deficit present.     Mental Status: He is alert and oriented to person, place, and time. Mental status is at baseline.  Psychiatric:        Mood and Affect: Mood normal.        Behavior: Behavior normal.        Thought Content: Thought content normal.        Judgment: Judgment normal.     No results found for any  visits on 01/22/24.     The 10-year ASCVD risk score (Arnett DK, et al., 2019) is: 31.5%    Assessment & Plan:   Problem List Items Addressed This Visit       Digestive   GERD (gastroesophageal reflux disease)   Relevant Medications   omeprazole (PRILOSEC) 40 MG capsule     Other   Hyperlipidemia   Relevant Medications   rosuvastatin (CRESTOR) 10 MG tablet   Other Visit Diagnoses       Upper respiratory tract infection, unspecified type    -  Primary   Relevant Medications   albuterol (VENTOLIN HFA) 108 (90 Base) MCG/ACT inhaler     Influenza A       Relevant Medications   albuterol (VENTOLIN HFA) 108 (90 Base) MCG/ACT inhaler     Upper respiratory tract infection, unspecified type -     Albuterol Sulfate HFA; Inhale 2 puffs into the lungs every 6 (six) hours as needed for wheezing or shortness of breath.  Dispense: 6.7 g; Refill: 0  Influenza A -     Albuterol Sulfate HFA; Inhale 2 puffs into the lungs every 6 (six) hours as needed for wheezing or shortness of breath.  Dispense: 6.7 g;  Refill: 0  Gastroesophageal reflux disease without esophagitis -     Omeprazole; Take 1 capsule (40 mg total) by mouth 2 (two) times daily.  Dispense: 180 capsule; Refill: 1  Mixed hyperlipidemia -     Rosuvastatin Calcium; Take 1 tablet (10 mg total) by mouth daily.  Dispense: 90 tablet; Refill: 1   Pt with lingering symptoms from Flu A. To continue otc cough suppressant and to send in Albuterol inhaler for him to use prn.  He reports uvula swelling. I advised pt this is from his allergic rhinitis and GERD. He says he is out of the Omeprazole and needs this refilled today. He is needing his Crestor 10mg  daily refilled. CPE and labs due next month.  No follow-ups on file.    Suzan Slick, MD

## 2024-02-16 ENCOUNTER — Other Ambulatory Visit (HOSPITAL_BASED_OUTPATIENT_CLINIC_OR_DEPARTMENT_OTHER): Payer: Self-pay

## 2024-02-16 MED ORDER — KETOCONAZOLE 2 % EX CREA
1.0000 | TOPICAL_CREAM | Freq: Every day | CUTANEOUS | 0 refills | Status: DC
  Filled 2024-02-16: qty 90, 90d supply, fill #0

## 2024-02-17 ENCOUNTER — Other Ambulatory Visit (HOSPITAL_BASED_OUTPATIENT_CLINIC_OR_DEPARTMENT_OTHER): Payer: Self-pay

## 2024-02-18 ENCOUNTER — Other Ambulatory Visit: Payer: Self-pay | Admitting: Family Medicine

## 2024-02-18 ENCOUNTER — Other Ambulatory Visit (HOSPITAL_BASED_OUTPATIENT_CLINIC_OR_DEPARTMENT_OTHER): Payer: Self-pay

## 2024-02-18 DIAGNOSIS — E1165 Type 2 diabetes mellitus with hyperglycemia: Secondary | ICD-10-CM

## 2024-02-18 MED ORDER — FREESTYLE LIBRE 3 PLUS SENSOR MISC
1.0000 | 5 refills | Status: DC
  Filled 2024-02-18 – 2024-05-31 (×3): qty 6, 90d supply, fill #0
  Filled 2024-09-06: qty 6, 90d supply, fill #1

## 2024-02-18 MED ORDER — FREESTYLE LIBRE 3 PLUS SENSOR MISC
1.0000 | 4 refills | Status: AC
  Filled 2024-02-18: qty 6, 90d supply, fill #0
  Filled 2024-05-20 – 2024-09-09 (×2): qty 6, 90d supply, fill #1
  Filled 2024-12-09: qty 2, 30d supply, fill #2
  Filled 2024-12-09: qty 6, 90d supply, fill #2

## 2024-02-22 ENCOUNTER — Encounter: Payer: Self-pay | Admitting: Family Medicine

## 2024-02-22 ENCOUNTER — Ambulatory Visit

## 2024-02-22 ENCOUNTER — Other Ambulatory Visit (HOSPITAL_BASED_OUTPATIENT_CLINIC_OR_DEPARTMENT_OTHER): Payer: Self-pay

## 2024-02-22 ENCOUNTER — Ambulatory Visit (INDEPENDENT_AMBULATORY_CARE_PROVIDER_SITE_OTHER): Payer: Commercial Managed Care - PPO | Admitting: Family Medicine

## 2024-02-22 VITALS — BP 161/98 | HR 89 | Temp 97.6°F | Resp 18 | Ht 75.0 in | Wt 229.4 lb

## 2024-02-22 DIAGNOSIS — Z136 Encounter for screening for cardiovascular disorders: Secondary | ICD-10-CM | POA: Diagnosis not present

## 2024-02-22 DIAGNOSIS — R1084 Generalized abdominal pain: Secondary | ICD-10-CM

## 2024-02-22 DIAGNOSIS — R21 Rash and other nonspecific skin eruption: Secondary | ICD-10-CM | POA: Diagnosis not present

## 2024-02-22 DIAGNOSIS — R109 Unspecified abdominal pain: Secondary | ICD-10-CM | POA: Diagnosis not present

## 2024-02-22 DIAGNOSIS — Z125 Encounter for screening for malignant neoplasm of prostate: Secondary | ICD-10-CM

## 2024-02-22 DIAGNOSIS — Z7984 Long term (current) use of oral hypoglycemic drugs: Secondary | ICD-10-CM | POA: Diagnosis not present

## 2024-02-22 DIAGNOSIS — E1165 Type 2 diabetes mellitus with hyperglycemia: Secondary | ICD-10-CM

## 2024-02-22 DIAGNOSIS — Z13220 Encounter for screening for lipoid disorders: Secondary | ICD-10-CM | POA: Diagnosis not present

## 2024-02-22 DIAGNOSIS — Z0000 Encounter for general adult medical examination without abnormal findings: Secondary | ICD-10-CM

## 2024-02-22 MED ORDER — TRIAMCINOLONE ACETONIDE 0.1 % EX CREA
1.0000 | TOPICAL_CREAM | Freq: Two times a day (BID) | CUTANEOUS | 0 refills | Status: DC | PRN
Start: 2024-02-22 — End: 2024-06-23
  Filled 2024-02-22: qty 30, 15d supply, fill #0

## 2024-02-22 NOTE — Progress Notes (Signed)
 Established Patient Office Visit  Subjective   Patient ID: Jamie Foster, male    DOB: [birth year]  Age: 58 y.o. MRN: [id number]  Chief Complaint  Patient presents with   Annual Exam    Patient is here for annual physical . Patient is fasting    Rash   Abdominal Pain    Rash  Abdominal Pain  Rash Pt reports rash on his face. Won't go away. He does say it is itchy. Tried otc steroid cream not helping. He does shave every other day.  Abdominal pain Pt reports 6 month hx of abdominal pain every time he bends over. He says it's sharp. Good bowel movements and denies constipation. No melena. Up to date with colonoscopy.   Review of Systems  Gastrointestinal:  Positive for abdominal pain.  Skin:  Positive for rash.  All other systems reviewed and are negative.    Objective:     BP (!) 161/98   Pulse 89   Temp 97.6 F (36.4 C) (Oral)   Resp 18   Ht 6\' 3"  (1.905 m)   Wt 229 lb 6.4 oz (104.1 kg)   SpO2 100%   BMI 28.67 kg/m    Physical Exam Vitals and nursing note reviewed.  Constitutional:      Appearance: Normal appearance. He is normal weight.  HENT:     Head: Normocephalic and atraumatic.     Right Ear: External ear normal.     Left Ear: External ear normal.     Nose: Nose normal.     Mouth/Throat:     Mouth: Mucous membranes are moist.     Pharynx: Oropharynx is clear.  Eyes:     Conjunctiva/sclera: Conjunctivae normal.     Pupils: Pupils are equal, round, and reactive to light.  Cardiovascular:     Rate and Rhythm: Normal rate and regular rhythm.     Pulses: Normal pulses.     Heart sounds: Normal heart sounds.  Pulmonary:     Effort: Pulmonary effort is normal.     Breath sounds: Normal breath sounds.  Abdominal:     General: Abdomen is flat. Bowel sounds are normal.  Skin:    General: Skin is warm.     Capillary Refill: Capillary refill takes less than 2 seconds.     Findings: Rash present.     Comments: To left cheek, small macules   Neurological:     General: No focal deficit present.     Mental Status: He is alert and oriented to person, place, and time. Mental status is at baseline.  Psychiatric:        Mood and Affect: Mood normal.        Behavior: Behavior normal.        Thought Content: Thought content normal.        Judgment: Judgment normal.    No results found for any visits on 02/22/24.    The 10-year ASCVD risk score (Arnett DK, et al., 2019) is: 33.1%    Assessment & Plan:   Problem List Items Addressed This Visit      Rash -     Triamcinolone Acetonide; Apply 1 Application topically 2 (two) times daily as needed.  Dispense: 30 g; Refill: 0  Generalized abdominal pain -     DG Abd 2 Views; Future   Pt with likely razor burn from shaving. Sent in Triamcinolone cream to use BID prn. Also advised pt that the more he shaves, the more  likely he will get recurrent flares of his razor bumps. He voiced understanding.  Unsure cause of abdominal pain x 6 months, worsening and occurs when he bends over? Will start with plain abdominal film.    Return in about 4 months (around 06/23/2024) for Diabetes.    Suzan Slick, MD

## 2024-02-22 NOTE — Progress Notes (Signed)
 Complete physical exam  Patient: Jamie Foster   DOB: 03-Aug-1966   58 y.o. Male  MRN: [id number]  Subjective:    Chief Complaint  Patient presents with   Annual Exam    Patient is here for annual physical . Patient is fasting    Rash   Abdominal Pain    Jamie Foster is a 58 y.o. male who presents today for a complete physical exam. He reports consuming a general diet. The patient has a physically strenuous job, but has no regular exercise apart from work.  He generally feels fairly well. He reports sleeping well. He does have additional problems to discuss today.  See separate Note   Most recent fall risk assessment:    03/02/2023    9:22 AM  Fall Risk   Falls in the past year? 0  Number falls in past yr: 0  Injury with Fall? 0  Risk for fall due to : No Fall Risks  Follow up Falls evaluation completed     Most recent depression screenings:    03/02/2023    9:22 AM 01/27/2023   11:40 AM  PHQ 2/9 Scores  PHQ - 2 Score 0 0  PHQ- 9 Score  0    Vision:Within last year  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 macular edema associated with type 2 diabetes mellitus (HCC) 08/21/2022   LPRD (laryngopharyngeal reflux disease) 07/24/2022   Tympanic membrane disorder, left 07/24/2022   Uncontrolled type 2 diabetes mellitus with hyperglycemia (HCC) 08/20/2020   Hyperlipidemia 01/23/2019   Low back pain without sciatica 01/23/2019   Uncontrolled type 2 diabetes mellitus with retinopathy, without long-term current use of insulin 12/21/2015   Family hx of prostate cancer 06/22/2013   Allergic rhinitis 06/22/2013   OSA (obstructive sleep apnea) 10/06/2012   BMI 30.0-30.9,adult 07/19/2012   HTN (hypertension) 07/19/2012   GERD (gastroesophageal reflux disease) 07/19/2012   DDD (degenerative disc disease), cervical 07/19/2012   Past  Medical History:  Diagnosis Date   Allergy    Diabetes mellitus without complication (HCC)    H pylori ulcer    Hyperlipidemia    Hypertension    Kidney stone    Sleep apnea    Past Surgical History:  Procedure Laterality Date   NASAL SINUS SURGERY     deviated septum   Social History   Socioeconomic History   Marital status: Married    Spouse name: Not on file   Number of children: Not on file   Years of education: Not on file   Highest education level: Master's degree (e.g., MA, MS, MEng, MEd, MSW, MBA)  Occupational History   Not on file  Tobacco Use   Smoking status: Never   Smokeless tobacco: Never  Vaping Use   Vaping status: Never Used  Substance and Sexual Activity   Alcohol use: No   Drug use: No   Sexual activity: Not on file  Other Topics Concern   Not on file  Social History Narrative   Married.Education: College/Other. Exercise: Walks.   From [place]   58 y.o, 60 yo, 60 y.o 60 y.o all girls   Wife works at [employer]   Social Drivers of Corporate investment banker Strain: Not on [employer] Insecurity: Not on file  Transportation Needs: Not on file  Physical  Activity: Not on file  Stress: Not on file  Social Connections: Unknown (02/24/2023)   [facility]   Social Network    Social Network: Not on file  Intimate Partner Violence: Unknown (02/24/2023)   Received from [facility]   HITS    Physically Hurt: Not on file    Insult or Talk Down To: Not on file    Threaten Physical Harm: Not on file    Scream or Curse: Not on file   Family Status  Relation Name Status   Mother  Deceased   Father  Deceased   Brother  Alive   MGM  Deceased   MGF  Deceased   PGM  Deceased   PGF  Deceased   Daughter  Alive   Son  (Not Specified)   Neg Hx  (Not Specified)  No partnership data on file   Family History  Problem Relation Age of Onset   Hypertension Mother    Prostate cancer Father    Colon cancer Neg Hx     Colon polyps Neg Hx    Allergies  Allergen Reactions   Lisinopril Cough   Metformin And Related Other (See Comments)    Abdominal pain      Patient Care Team: Suzan Slick, MD as PCP - General (Family Medicine)   Outpatient Medications Prior to Visit  Medication Sig   albuterol (VENTOLIN HFA) 108 (90 Base) MCG/ACT inhaler Inhale 2 puffs into the lungs every 6 (six) hours as needed for wheezing or shortness of breath.   benzonatate (TESSALON) 200 MG capsule Take 1 capsule (200 mg total) by mouth 2 (two) times daily as needed for cough.   Continuous Glucose Sensor (DEXCOM G7 SENSOR) MISC Apply 1 sensor to skin as directed then change every 10 days   Continuous Glucose Sensor (FREESTYLE LIBRE 2 SENSOR) MISC Apply 1 sensor once every 14 days   Continuous Glucose Sensor (FREESTYLE LIBRE 2 SENSOR) MISC Apply 1 sensor once every 14 days   Continuous Glucose Sensor (FREESTYLE LIBRE 3 PLUS SENSOR) MISC Change sensor every 14 (fourteen) days.   Continuous Glucose Sensor (FREESTYLE LIBRE 3 PLUS SENSOR) MISC Apply 1 sensor as directed to check blood sugar and change every 15 (fifteen) days.   Continuous Glucose Sensor (FREESTYLE LIBRE 3 PLUS SENSOR) MISC Apply 1 sensor as directed to check blood sugar and change every 15 (fifteen) days.   dapagliflozin propanediol (FARXIGA) 10 MG TABS tablet Take 1 tablet by mouth every morning   glucose blood (TRUETRACK TEST) test strip Use 2x a day   GLUCOTROL XL 5 MG 24 hr tablet Take 1 tablet (5 mg total) by mouth 2 (two) times daily.   insulin glargine (LANTUS SOLOSTAR) 100 UNIT/ML Solostar Pen Inject 18 Units into the skin at bedtime.   Insulin Pen Needle 32G X 4 MM MISC Use as directed 1 time a day.   ketoconazole (NIZORAL) 2 % cream Apply liberally to affected area once a day.   losartan (COZAAR) 100 MG tablet Take 1 tablet (100 mg total) by mouth daily.   montelukast (SINGULAIR) 10 MG tablet Take 1 tablet (10 mg total) by mouth at bedtime.    neomycin-polymyxin-hydrocortisone (CORTISPORIN) 3.5-10000-1 OTIC suspension Place 3 drops into the left ear 4 (four) times daily.   Olopatadine HCl 0.2 % SOLN Apply 1 drop to eye daily as needed.   omeprazole (PRILOSEC) 40 MG capsule Take 1 capsule (40 mg total) by mouth 2 (two) times daily.  ONETOUCH DELICA LANCETS 33G MISC Use to test blood sugar 2 times daily as instructed.   oseltamivir (TAMIFLU) 75 MG capsule Take 1 capsule (75 mg total) by mouth 2 (two) times daily.   rosuvastatin (CRESTOR) 10 MG tablet Take 1 tablet (10 mg total) by mouth daily.   Semaglutide, 1 MG/DOSE, (OZEMPIC, 1 MG/DOSE,) 4 MG/3ML SOPN Inject 1 mg into the skin once a week.   triamcinolone (NASACORT) 55 MCG/ACT AERO nasal inhaler Place 2 squirts in each nostril at night before bedtime   [place] LANCETS MISC Use 2x a day - Tru Track   No facility-administered medications prior to visit.    Review of Systems  All other systems reviewed and are negative.         Objective:     BP (!) 161/98   Pulse 89   Temp 97.6 F (36.4 C) (Oral)   Resp 18   Ht 6\' 3"  (1.905 m)   Wt 229 lb 6.4 oz (104.1 kg)   SpO2 100%   BMI 28.67 kg/m  BP Readings from Last 3 Encounters:  02/22/24 (!) 161/98  01/22/24 (!) 156/76  01/06/24 (!) 178/96      Physical Exam Vitals and nursing note reviewed.  Constitutional:      Appearance: Normal appearance. He is normal weight.  HENT:     Head: Normocephalic and atraumatic.     Right Ear: Tympanic membrane, ear canal and external ear normal.     Left Ear: Tympanic membrane, ear canal and external ear normal.     Nose: Nose normal.     Mouth/Throat:     Mouth: Mucous membranes are moist.     Pharynx: Oropharynx is clear.  Eyes:     Conjunctiva/sclera: Conjunctivae normal.     Pupils: Pupils are equal, round, and reactive to light.  Cardiovascular:     Rate and Rhythm: Normal rate and regular rhythm.     Pulses: Normal pulses.     Heart sounds: Normal heart sounds.   Pulmonary:     Effort: Pulmonary effort is normal.     Breath sounds: Normal breath sounds.  Abdominal:     General: Abdomen is flat. Bowel sounds are normal.  Musculoskeletal:     Cervical back: Normal range of motion.  Skin:    General: Skin is warm.     Capillary Refill: Capillary refill takes less than 2 seconds.  Neurological:     General: No focal deficit present.     Mental Status: He is alert and oriented to person, place, and time. Mental status is at baseline.  Psychiatric:        Mood and Affect: Mood normal.        Behavior: Behavior normal.        Thought Content: Thought content normal.        Judgment: Judgment normal.      No results found for any visits on 02/22/24. Last CBC Lab Results  Component Value Date   WBC 4.9 02/19/2023   HGB 15.2 02/19/2023   HCT 45.5 02/19/2023   MCV 92 02/19/2023   MCH 30.8 02/19/2023   RDW 11.6 02/19/2023   PLT 296 02/19/2023   Last metabolic panel Lab Results  Component Value Date   GLUCOSE 108 (H) 08/04/2023   NA 140 08/04/2023   K 4.1 08/04/2023   CL 99 08/04/2023   CO2 26 08/04/2023   BUN 20 08/04/2023   CREATININE 1.42 (H) 08/04/2023   EGFR 57 (L)  08/04/2023   CALCIUM 9.7 08/04/2023   PROT 6.8 02/19/2023   ALBUMIN 4.3 02/19/2023   LABGLOB 2.5 02/19/2023   AGRATIO 1.7 02/19/2023   BILITOT 0.4 02/19/2023   ALKPHOS 66 02/19/2023   AST 17 02/19/2023   ALT 20 02/19/2023   Last lipids Lab Results  Component Value Date   CHOL 209 (H) 02/19/2023   HDL 55 02/19/2023   LDLCALC 138 (H) 02/19/2023   TRIG 92 02/19/2023   CHOLHDL 3.8 02/19/2023   Last hemoglobin A1c Lab Results  Component Value Date   HGBA1C 7.6 (A) 08/04/2023        Assessment & Plan:    Routine Health Maintenance and Physical Exam  Immunization History  Administered Date(s) Administered   PFIZER Comirnaty(Gray Top)Covid-19 Tri-Sucrose Vaccine 05/02/2021   PFIZER(Purple Top)SARS-COV-2 Vaccination 02/20/2020, 03/12/2020, 09/10/2020    Pfizer Covid-19 Vaccine Bivalent Booster 31yrs & up 10/09/2021   Pfizer(Comirnaty)Fall Seasonal Vaccine 12 years and older 09/11/2022, 08/27/2023   Pneumococcal Conjugate-13 01/30/2016   Pneumococcal Polysaccharide-23 05/03/2018   Tdap 01/30/2016   Zoster Recombinant(Shingrix) 04/13/2020, 06/07/2020    Health Maintenance  Topic Date Due   FOOT EXAM  02/20/2021   Diabetic kidney evaluation - Urine ACR  01/27/2024   HEMOGLOBIN A1C  02/01/2024   Diabetic kidney evaluation - eGFR measurement  08/03/2024   OPHTHALMOLOGY EXAM  09/07/2024   DTaP/Tdap/Td (2 - Td or Tdap) 01/29/2026   Colonoscopy  07/23/2026   Pneumococcal Vaccine 24-30 Years old (3 of 3 - PPSV23 or PCV20) 09/06/2029   COVID-19 Vaccine  Completed   Hepatitis C Screening  Completed   HIV Screening  Completed   Zoster Vaccines- Shingrix  Completed   HPV VACCINES  Aged Out   INFLUENZA VACCINE  Discontinued    Discussed health benefits of physical activity, and encouraged him to engage in regular exercise appropriate for his age and condition.  Problem List Items Addressed This Visit       Endocrine   Uncontrolled type 2 diabetes mellitus with hyperglycemia (HCC)   Relevant Orders   CBC with Differential/Platelet   Comprehensive metabolic panel with GFR   Hemoglobin A1c   Microalbumin / creatinine urine ratio   Other Visit Diagnoses       Annual physical exam    -  Primary     Encounter for lipid screening for cardiovascular disease       Relevant Orders   Lipid panel     Screening PSA (prostate specific antigen)       Relevant Orders   PSA     Rash       Relevant Medications   triamcinolone cream (KENALOG) 0.1 %     Generalized abdominal pain       Relevant Orders   DG Abd 2 Views      Return in about 4 months (around 06/23/2024) for Diabetes. Annual physical exam  Encounter for lipid screening for cardiovascular disease -     Lipid panel  Uncontrolled type 2 diabetes mellitus with hyperglycemia  (HCC) -     CBC with Differential/Platelet -     Comprehensive metabolic panel with GFR -     Hemoglobin A1c -     Microalbumin / creatinine urine ratio  Screening PSA (prostate specific antigen) -     PSA Screening CPE labs  See in 4 months for Diabetes follow up    Suzan Slick, MD

## 2024-02-23 LAB — CBC WITH DIFFERENTIAL/PLATELET
Basophils Absolute: 0 10*3/uL (ref 0.0–0.2)
Basos: 1 %
EOS (ABSOLUTE): 0.2 10*3/uL (ref 0.0–0.4)
Eos: 4 %
Hematocrit: 42.6 % (ref 37.5–51.0)
Hemoglobin: 14 g/dL (ref 13.0–17.7)
Immature Grans (Abs): 0 10*3/uL (ref 0.0–0.1)
Immature Granulocytes: 0 %
Lymphocytes Absolute: 1.6 10*3/uL (ref 0.7–3.1)
Lymphs: 32 %
MCH: 30 pg (ref 26.6–33.0)
MCHC: 32.9 g/dL (ref 31.5–35.7)
MCV: 91 fL (ref 79–97)
Monocytes Absolute: 0.6 10*3/uL (ref 0.1–0.9)
Monocytes: 12 %
Neutrophils Absolute: 2.7 10*3/uL (ref 1.4–7.0)
Neutrophils: 51 %
Platelets: 337 10*3/uL (ref 150–450)
RBC: 4.67 x10E6/uL (ref 4.14–5.80)
RDW: 12.7 % (ref 11.6–15.4)
WBC: 5.2 10*3/uL (ref 3.4–10.8)

## 2024-02-23 LAB — COMPREHENSIVE METABOLIC PANEL WITH GFR
ALT: 35 IU/L (ref 0–44)
AST: 32 IU/L (ref 0–40)
Albumin: 4.4 g/dL (ref 3.8–4.9)
Alkaline Phosphatase: 65 IU/L (ref 44–121)
BUN/Creatinine Ratio: 13 (ref 9–20)
BUN: 16 mg/dL (ref 6–24)
Bilirubin Total: 0.4 mg/dL (ref 0.0–1.2)
CO2: 23 mmol/L (ref 20–29)
Calcium: 9.7 mg/dL (ref 8.7–10.2)
Chloride: 102 mmol/L (ref 96–106)
Creatinine, Ser: 1.21 mg/dL (ref 0.76–1.27)
Globulin, Total: 2.6 g/dL (ref 1.5–4.5)
Glucose: 117 mg/dL — ABNORMAL HIGH (ref 70–99)
Potassium: 4.3 mmol/L (ref 3.5–5.2)
Sodium: 140 mmol/L (ref 134–144)
Total Protein: 7 g/dL (ref 6.0–8.5)
eGFR: 69 mL/min/{1.73_m2} (ref >59)

## 2024-02-23 LAB — LIPID PANEL
Chol/HDL Ratio: 2.5 ratio (ref 0.0–5.0)
Cholesterol, Total: 142 mg/dL (ref 100–199)
HDL: 57 mg/dL (ref >39)
LDL Chol Calc (NIH): 72 mg/dL (ref 0–99)
Triglycerides: 60 mg/dL (ref 0–149)
VLDL Cholesterol Cal: 13 mg/dL (ref 5–40)

## 2024-02-23 LAB — PSA: Prostate Specific Ag, Serum: 2.9 ng/mL (ref 0.0–4.0)

## 2024-02-23 LAB — MICROALBUMIN / CREATININE URINE RATIO
Creatinine, Urine: 265.3 mg/dL
Microalb/Creat Ratio: 101 mg/g{creat} — ABNORMAL HIGH (ref 0–29)
Microalbumin, Urine: 268.9 ug/mL

## 2024-02-23 LAB — HEMOGLOBIN A1C
Est. average glucose Bld gHb Est-mCnc: 226 mg/dL
Hgb A1c MFr Bld: 9.5 % — ABNORMAL HIGH (ref 4.8–5.6)

## 2024-03-03 ENCOUNTER — Other Ambulatory Visit (HOSPITAL_BASED_OUTPATIENT_CLINIC_OR_DEPARTMENT_OTHER): Payer: Self-pay

## 2024-03-14 ENCOUNTER — Telehealth: Payer: Self-pay | Admitting: Family Medicine

## 2024-03-14 ENCOUNTER — Telehealth: Payer: Self-pay

## 2024-03-14 NOTE — Telephone Encounter (Signed)
 Copied from CRM [id number]. Topic: Medical Record Request - Other >> Mar 14, 2024  2:15 PM Zipporah Him wrote: Reason for CRM: Patient states he needs his last lab results but he is not able to access them on mychart. Please call to assist

## 2024-03-14 NOTE — Telephone Encounter (Signed)
 Spoke with patient regarding his labs and he has requested that a copy of the labs be mailed out to him

## 2024-03-23 ENCOUNTER — Encounter: Payer: Self-pay | Admitting: Gastroenterology

## 2024-04-29 ENCOUNTER — Other Ambulatory Visit (HOSPITAL_BASED_OUTPATIENT_CLINIC_OR_DEPARTMENT_OTHER): Payer: Self-pay

## 2024-04-29 ENCOUNTER — Other Ambulatory Visit: Payer: Self-pay | Admitting: Family Medicine

## 2024-04-29 DIAGNOSIS — E1165 Type 2 diabetes mellitus with hyperglycemia: Secondary | ICD-10-CM

## 2024-04-29 DIAGNOSIS — I10 Essential (primary) hypertension: Secondary | ICD-10-CM

## 2024-04-29 MED ORDER — OZEMPIC (1 MG/DOSE) 4 MG/3ML ~~LOC~~ SOPN
1.0000 mg | PEN_INJECTOR | SUBCUTANEOUS | 0 refills | Status: DC
Start: 2024-04-29 — End: 2024-09-07
  Filled 2024-04-29: qty 9, 84d supply, fill #0

## 2024-04-29 MED ORDER — LOSARTAN POTASSIUM 100 MG PO TABS
100.0000 mg | ORAL_TABLET | Freq: Every day | ORAL | 0 refills | Status: DC
  Filled 2024-04-29: qty 90, 90d supply, fill #0

## 2024-04-30 ENCOUNTER — Other Ambulatory Visit (HOSPITAL_BASED_OUTPATIENT_CLINIC_OR_DEPARTMENT_OTHER): Payer: Self-pay

## 2024-04-30 MED ORDER — KETOCONAZOLE 2 % EX CREA
1.0000 | TOPICAL_CREAM | Freq: Every day | CUTANEOUS | 0 refills | Status: DC
  Filled 2024-04-30: qty 60, 60d supply, fill #0
  Filled 2024-05-02: qty 30, 30d supply, fill #0

## 2024-05-02 ENCOUNTER — Other Ambulatory Visit (HOSPITAL_COMMUNITY): Payer: Self-pay

## 2024-05-02 ENCOUNTER — Encounter (HOSPITAL_BASED_OUTPATIENT_CLINIC_OR_DEPARTMENT_OTHER): Payer: Self-pay

## 2024-05-02 ENCOUNTER — Other Ambulatory Visit (HOSPITAL_BASED_OUTPATIENT_CLINIC_OR_DEPARTMENT_OTHER): Payer: Self-pay

## 2024-05-02 ENCOUNTER — Other Ambulatory Visit: Payer: Self-pay

## 2024-05-02 ENCOUNTER — Other Ambulatory Visit: Payer: Self-pay | Admitting: Family Medicine

## 2024-05-02 DIAGNOSIS — E1165 Type 2 diabetes mellitus with hyperglycemia: Secondary | ICD-10-CM

## 2024-05-06 ENCOUNTER — Telehealth: Payer: Self-pay

## 2024-05-06 DIAGNOSIS — E1165 Type 2 diabetes mellitus with hyperglycemia: Secondary | ICD-10-CM

## 2024-05-06 NOTE — Telephone Encounter (Signed)
 Copied from CRM [id number]. Topic: Clinical - Medication Question >> May 06, 2024 12:17 PM Carlatta H wrote: Reason for CRM: Patient would like to know what the next brand for glucotrol  would be//Please call patient to advise

## 2024-05-09 MED ORDER — GLUCOTROL XL 5 MG PO TB24: 5.0000 mg | ORAL_TABLET | Freq: Two times a day (BID) | ORAL | 1 refills | Status: DC

## 2024-05-09 NOTE — Telephone Encounter (Signed)
 Sent Glucotrol  5mg  BID to [facility] as requested. Pt requests brand name only.

## 2024-05-18 ENCOUNTER — Encounter: Payer: Self-pay | Admitting: Gastroenterology

## 2024-05-18 ENCOUNTER — Ambulatory Visit: Admitting: Gastroenterology

## 2024-05-18 ENCOUNTER — Other Ambulatory Visit (HOSPITAL_BASED_OUTPATIENT_CLINIC_OR_DEPARTMENT_OTHER): Payer: Self-pay

## 2024-05-18 VITALS — BP 160/90 | HR 90 | Ht 75.0 in | Wt 227.0 lb

## 2024-05-18 DIAGNOSIS — K219 Gastro-esophageal reflux disease without esophagitis: Secondary | ICD-10-CM

## 2024-05-18 DIAGNOSIS — G8929 Other chronic pain: Secondary | ICD-10-CM

## 2024-05-18 DIAGNOSIS — R490 Dysphonia: Secondary | ICD-10-CM | POA: Diagnosis not present

## 2024-05-18 DIAGNOSIS — R1013 Epigastric pain: Secondary | ICD-10-CM | POA: Diagnosis not present

## 2024-05-18 MED ORDER — FAMOTIDINE 20 MG PO TABS
20.0000 mg | ORAL_TABLET | Freq: Two times a day (BID) | ORAL | 2 refills | Status: DC
  Filled 2024-05-18: qty 30, 15d supply, fill #0
  Filled 2024-05-30: qty 30, 15d supply, fill #1

## 2024-05-18 NOTE — Patient Instructions (Addendum)
 Recommend GERD diet, no late meals 3 to 4 hours before lying down Continue Omeprazole  40 mg po daily Start Pepcid 20 mg po at bedtime  Recommend high-fiber diet  Your provider has ordered [employer] stool testing for you. You have received a kit from our office today containing all necessary supplies to complete this test. Please carefully read the stool collection instructions provided in the kit before opening the accompanying materials. In addition, be sure there is a label providing your full name and date of birth on the puritan opti-swab tube that is supplied in the kit (if you do not see a label with this information on your test tube, please make us  aware before test collection!). After completing the test, you should secure the purtian tube into the specimen biohazard bag. The [facility] E-Req sheet (including date and time of specimen collection) should be placed into the outside pocket of the specimen biohazard bag and returned to the [place] lab (basement floor of [facility]) within 3 days of collection. Please make sure to give the specimen to a staff member at the lab. DO NOT leave the specimen on the counter.   If the specimen date and time (can be found in the upper right boxed portion of the sheet) are not filled out on the E-Req sheet, the test will NOT be performed.   You have been scheduled for an endoscopy. Please follow written instructions given to you at your visit today.  If you use inhalers (even only as needed), please bring them with you on the day of your procedure.  If you take any of the following medications, they will need to be adjusted prior to your procedure:   DO NOT TAKE 7 DAYS PRIOR TO TEST- Trulicity (dulaglutide) Ozempic , Wegovy  (semaglutide ) Mounjaro (tirzepatide) Bydureon Bcise (exanatide extended release)  DO NOT TAKE 1 DAY PRIOR TO YOUR TEST Rybelsus  (semaglutide ) Adlyxin (lixisenatide) Victoza  (liraglutide) Byetta (exanatide) ___________________________________________________________________________  Due to recent changes in healthcare laws, you may see the results of your imaging and laboratory studies on MyChart before your provider has had a chance to review them.  We understand that in some cases there may be results that are confusing or concerning to you. Not all laboratory results come back in the same time frame and the provider may be waiting for multiple results in order to interpret others.  Please give us  48 hours in order for your provider to thoroughly review all the results before contacting the office for clarification of your results.   _______________________________________________________  If your blood pressure at your visit was 140/90 or greater, please contact your primary care physician to follow up on this.  _______________________________________________________  If you are age 55 or older, your body mass index should be between 23-30. Your Body mass index is 28.37 kg/m. If this is out of the aforementioned range listed, please consider follow up with your Primary Care Provider.  If you are age 45 or younger, your body mass index should be between 19-25. Your Body mass index is 28.37 kg/m. If this is out of the aformentioned range listed, please consider follow up with your Primary Care Provider.   ________________________________________________________  The [facility] providers would like to encourage you to use MYCHART to communicate with providers for non-urgent requests or questions.  Due to long hold times on the telephone, sending your provider a message by [facility] may be a faster and more efficient way to get a response.  Please allow 48 business hours for a response.  Please remember that this is for non-urgent requests.  _______________________________________________________  Thank you for trusting me with your gastrointestinal care. Deanna  May, RNP

## 2024-05-18 NOTE — Progress Notes (Signed)
 Chief Complaint:LPRD, screening for colon cancer Primary GI Doctor:( Previously Dr. Teressa) Dr. San   HPI:  Patient is a 58 year old male patient with past medical history of DM, hyperlipidemia, hypertension, who was referred to me by Colette Torrence GRADE, MD on 08/04/23 for a complaint of LPRD, screening for colon CA .    Interval History    Patient presents for evaluation of chronic abd pain and GERD. Patient has history of GERD currently taking Omeprazole  40mg  po daily. He reports he smells/burps the food up several hours later. He also has hoarseness which affects his ability to speak during meetings at work. He admits sometimes he eats shortly before lying down but tries to wait 2 hours.  Patient denies dysphagia. Patient denies nausea, vomiting, or weight loss. He has been evaluated by ENT and told he did not have any allergies noted on prick test.    Patient reports he has intermittent upper abdominal pain he states he has had for over two years. He reports when he bends over it feels like his stomach is being squeezed. He states it feels like he's pumped with gas and unable to burp it up.  Pain not worse with eating. Pain not relieved with bowel movement.      Patient reports he has bowel movement daily and feels that he empties. I discussed with him the last abd xray from March showing large stool burden and he denies any issues with constipation. No blood in stool.   Patient drinks alcohol during social events, very seldom. Nonsmoker.   OTC Advil  and Tylenol  only for 1 week due to pulled muscle in back otherwise he does not take.    Patients last colonoscopy was in 06/2016 with Dr. Teressa and was normal.  Patient never had EGD.   Patient denies family history of GI issues or CA.  Wt Readings from Last 3 Encounters:  05/18/24 227 lb (103 kg)  02/22/24 229 lb 6.4 oz (104.1 kg)  01/22/24 222 lb 9.6 oz (101 kg)    Past Medical History:  Diagnosis Date   Allergy    Diabetes  mellitus without complication (HCC)    H pylori ulcer    Hyperlipidemia    Hypertension    Kidney stone    Sleep apnea     Past Surgical History:  Procedure Laterality Date   NASAL SINUS SURGERY     deviated septum   Current Outpatient Medications  Medication Sig Dispense Refill   albuterol  (VENTOLIN  HFA) 108 (90 Base) MCG/ACT inhaler Inhale 2 puffs into the lungs every 6 (six) hours as needed for wheezing or shortness of breath. 6.7 g 0   benzonatate  (TESSALON ) 200 MG capsule Take 1 capsule (200 mg total) by mouth 2 (two) times daily as needed for cough. 20 capsule 0   Continuous Glucose Sensor (DEXCOM G7 SENSOR) MISC Apply 1 sensor to skin as directed then change every 10 days 9 each 1   Continuous Glucose Sensor (FREESTYLE LIBRE 2 SENSOR) MISC Apply 1 sensor once every 14 days 7 each 4   Continuous Glucose Sensor (FREESTYLE LIBRE 2 SENSOR) MISC Apply 1 sensor once every 14 days 7 each 4   Continuous Glucose Sensor (FREESTYLE LIBRE 3 PLUS SENSOR) MISC Change sensor every 14 (fourteen) days. 2 each 1   Continuous Glucose Sensor (FREESTYLE LIBRE 3 PLUS SENSOR) MISC Apply 1 sensor as directed to check blood sugar and change every 15 (fifteen) days. 6 each 4   Continuous Glucose  Sensor (FREESTYLE LIBRE 3 PLUS SENSOR) MISC Apply 1 sensor as directed to check blood sugar and change every 15 (fifteen) days. 6 each 5   dapagliflozin  propanediol (FARXIGA ) 10 MG TABS tablet Take 1 tablet by mouth every morning 90 tablet 1   famotidine (PEPCID) 20 MG tablet Take 1 tablet (20 mg total) by mouth 2 (two) times daily. 30 tablet 2   glucose blood (TRUETRACK TEST) test strip Use 2x a day 100 each 5   GLUCOTROL  XL 5 MG 24 hr tablet Take 1 tablet (5 mg total) by mouth 2 (two) times daily. 180 tablet 1   insulin  glargine (LANTUS  SOLOSTAR) 100 UNIT/ML Solostar Pen Inject 18 Units into the skin at bedtime. 15 mL 11   Insulin  Pen Needle 32G X 4 MM MISC Use as directed 1 time a day. 100 each 3   ketoconazole   (NIZORAL ) 2 % cream Apply liberally to affected area once a day. 90 g 0   losartan  (COZAAR ) 100 MG tablet Take 1 tablet (100 mg total) by mouth daily. 90 tablet 0   montelukast  (SINGULAIR ) 10 MG tablet Take 1 tablet (10 mg total) by mouth at bedtime. 90 tablet 1   neomycin -polymyxin-hydrocortisone (CORTISPORIN ) 3.5-10000-1 OTIC suspension Place 3 drops into the left ear 4 (four) times daily. 10 mL 1   Olopatadine  HCl 0.2 % SOLN Apply 1 drop to eye daily as needed. 2.5 mL 1   omeprazole  (PRILOSEC) 40 MG capsule Take 1 capsule (40 mg total) by mouth 2 (two) times daily. 180 capsule 1   ONETOUCH DELICA LANCETS 33G MISC Use to test blood sugar 2 times daily as instructed. 100 each 11   oseltamivir  (TAMIFLU ) 75 MG capsule Take 1 capsule (75 mg total) by mouth 2 (two) times daily. 10 capsule 0   rosuvastatin  (CRESTOR ) 10 MG tablet Take 1 tablet (10 mg total) by mouth daily. 90 tablet 1   Semaglutide , 1 MG/DOSE, (OZEMPIC , 1 MG/DOSE,) 4 MG/3ML SOPN Inject 1 mg into the skin once a week. 9 mL 0   triamcinolone  (NASACORT ) 55 MCG/ACT AERO nasal inhaler Place 2 squirts in each nostril at night before bedtime 33.8 mL 2   triamcinolone  cream (KENALOG ) 0.1 % Apply 1 Application topically 2 (two) times daily as needed. 30 g 0   [place] LANCETS MISC Use 2x a day - Tru Track 200 each 5   No current facility-administered medications for this visit.    Allergies as of 05/18/2024 - Review Complete 05/18/2024  Allergen Reaction Noted   Lisinopril  Cough 02/19/2023   Metformin  and related Other (See Comments) 02/19/2023    Family History  Problem Relation Age of Onset   Hypertension Mother    Prostate cancer Father    Colon cancer Neg Hx    Colon polyps Neg Hx     Review of Systems:    Constitutional: No weight loss, fever, chills, weakness or fatigue HEENT: Eyes: No change in vision               Ears, Nose, Throat:  No change in hearing or congestion Skin: No rash or itching Cardiovascular: No chest  pain, chest pressure or palpitations   Respiratory: No SOB or cough Gastrointestinal: See HPI and otherwise negative Genitourinary: No dysuria or change in urinary frequency Neurological: No headache, dizziness or syncope Musculoskeletal: No new muscle or joint pain Hematologic: No bleeding or bruising Psychiatric: No history of depression or anxiety    Physical Exam:  Vital signs: BP (!) 160/90  Pulse 90   Ht 6' 3 (1.905 m)   Wt 227 lb (103 kg)   SpO2 100%   BMI 28.37 kg/m   Constitutional:   Pleasant male appears to be in NAD, Well developed, Well nourished, alert and cooperative Throat: Oral cavity and pharynx without inflammation, swelling or lesion.  Respiratory: Respirations even and unlabored. Lungs clear to auscultation bilaterally.   No wheezes, crackles, or rhonchi.  Cardiovascular: Normal S1, S2. Regular rate and rhythm. No peripheral edema, cyanosis or pallor.  Gastrointestinal:  Soft, nondistended, nontender. No rebound or guarding. Normal bowel sounds. No appreciable masses or hepatomegaly. Rectal:  Not performed.  Msk:  Symmetrical without gross deformities. Without edema, no deformity or joint abnormality.  Neurologic:  Alert and  oriented x4;  grossly normal neurologically.  Skin:   Dry and intact without significant lesions or rashes. Psychiatric: Oriented to person, place and time. Demonstrates good judgement and reason without abnormal affect or behaviors.  RELEVANT LABS AND IMAGING: CBC    Latest Ref Rng & Units 02/22/2024    9:15 AM 02/19/2023    9:01 AM 02/21/2020    8:09 AM  CBC  WBC 3.4 - 10.8 x10E3/uL 5.2  4.9  6.6   Hemoglobin 13.0 - 17.7 g/dL 85.9  84.7  85.7   Hematocrit 37.5 - 51.0 % 42.6  45.5  42.5   Platelets 150 - 450 x10E3/uL 337  296  239.0      CMP     Latest Ref Rng & Units 02/22/2024    9:15 AM 08/04/2023    9:50 AM 02/19/2023    9:01 AM  CMP  Glucose 70 - 99 mg/dL 882  891  818   BUN 6 - 24 mg/dL 16  20  15    Creatinine 0.76 -  1.27 mg/dL 8.78  8.57  8.76   Sodium 134 - 144 mmol/L 140  140  135   Potassium 3.5 - 5.2 mmol/L 4.3  4.1  4.5   Chloride 96 - 106 mmol/L 102  99  99   CO2 20 - 29 mmol/L 23  26  25    Calcium  8.7 - 10.2 mg/dL 9.7  9.7  9.7   Total Protein 6.0 - 8.5 g/dL 7.0   6.8   Total Bilirubin 0.0 - 1.2 mg/dL 0.4   0.4   Alkaline Phos 44 - 121 IU/L 65   66   AST 0 - 40 IU/L 32   17   ALT 0 - 44 IU/L 35   20      Lab Results  Component Value Date   TSH 1.480 02/19/2023   07/23/2016 colonoscopy with Dr. Teressa, recall 10 years - The entire examined colon is normal on direct and retroflexion views. - No specimens collected.  02/22/2024 abdominal x-ray for intermittent abdominal pain Impression: Large amount of stool throughout the colon  Assessment: Encounter Diagnoses  Name Primary?   Gastroesophageal reflux disease, unspecified whether esophagitis present Yes   Hoarseness    Abdominal pain, chronic, epigastric     58 year old male patient who presents with complaint of uncontrolled GERD, hoarseness, and epigastric pain. He is currently on omeprazole  40 mg po daily and follows GERD diet most of the time. Reinforced GERD diet, no late meals. Will add Pepcid at bedtime. Will also check H pylori diathereix stool test. Will proceed with upper GI endoscopy to evaluate for esophagitis, Barrett's, or gastritis.     UTD on colon screening colonoscopy,  recall 06/2026.  Plan: - GERD diet, no late meals 3 to 4 hours before lying down -Continue omeprazole  40 mg po daily  -Start pepcid 20 mg at bedtime - Recommend High-fiber diet - h pylori diathereix stool test -Schedule EGD in [facility] with Dr. San The risks and benefits of EGD with possible biopsies and esophageal dilation were discussed with the patient who agrees to proceed.  Thank you for the courtesy of this consult. Please call me with any questions or concerns.   Chamika Cunanan, FNP-C [facility] 05/18/2024, 9:54 AM  Cc:  Colette Torrence GRADE, MD

## 2024-05-20 ENCOUNTER — Other Ambulatory Visit (HOSPITAL_BASED_OUTPATIENT_CLINIC_OR_DEPARTMENT_OTHER): Payer: Self-pay

## 2024-05-30 ENCOUNTER — Other Ambulatory Visit (HOSPITAL_BASED_OUTPATIENT_CLINIC_OR_DEPARTMENT_OTHER): Payer: Self-pay

## 2024-05-30 ENCOUNTER — Other Ambulatory Visit: Payer: Self-pay | Admitting: Family Medicine

## 2024-05-30 DIAGNOSIS — E1165 Type 2 diabetes mellitus with hyperglycemia: Secondary | ICD-10-CM

## 2024-05-30 MED ORDER — INSULIN PEN NEEDLE 32G X 4 MM MISC
1.0000 | Freq: Every day | 0 refills | Status: AC
  Filled 2024-05-30 (×2): qty 100, 90d supply, fill #0

## 2024-05-31 ENCOUNTER — Other Ambulatory Visit (HOSPITAL_BASED_OUTPATIENT_CLINIC_OR_DEPARTMENT_OTHER): Payer: Self-pay

## 2024-06-08 ENCOUNTER — Other Ambulatory Visit (HOSPITAL_BASED_OUTPATIENT_CLINIC_OR_DEPARTMENT_OTHER): Payer: Self-pay

## 2024-06-08 NOTE — Progress Notes (Signed)
 Agree with the assessment and plan as outlined by [facility], FNP-C.  Carlitos Bottino, DO, [facility]

## 2024-06-09 ENCOUNTER — Encounter: Payer: Self-pay | Admitting: Family Medicine

## 2024-06-09 ENCOUNTER — Other Ambulatory Visit (HOSPITAL_BASED_OUTPATIENT_CLINIC_OR_DEPARTMENT_OTHER): Payer: Self-pay

## 2024-06-09 ENCOUNTER — Ambulatory Visit: Admitting: Family Medicine

## 2024-06-09 ENCOUNTER — Ambulatory Visit: Payer: Self-pay | Admitting: Family Medicine

## 2024-06-09 ENCOUNTER — Ambulatory Visit

## 2024-06-09 VITALS — BP 107/75 | HR 97 | Temp 97.9°F | Resp 18 | Ht 75.0 in | Wt 235.0 lb

## 2024-06-09 DIAGNOSIS — R059 Cough, unspecified: Secondary | ICD-10-CM | POA: Diagnosis not present

## 2024-06-09 DIAGNOSIS — R051 Acute cough: Secondary | ICD-10-CM

## 2024-06-09 MED ORDER — PROMETHAZINE-DM 6.25-15 MG/5ML PO SYRP
5.0000 mL | ORAL_SOLUTION | Freq: Every evening | ORAL | 0 refills | Status: DC | PRN
  Filled 2024-06-09: qty 118, 23d supply, fill #0

## 2024-06-09 MED ORDER — METHYLPREDNISOLONE 4 MG PO TBPK
ORAL_TABLET | ORAL | 0 refills | Status: DC
  Filled 2024-06-09: qty 21, 6d supply, fill #0

## 2024-06-09 NOTE — Telephone Encounter (Signed)
-----   Message from Torrence CINDERELLA Barrier sent at 06/09/2024  3:31 PM EDT ----- Please inform pt his chest xray was negative for any acute findings. I will work on the lung specialist referral. I initially told him there was one in [facility] but there isn't so I referred him to  one in [place] if that's ok? ----- Message ----- From: Interface, Rad Results In Sent: 06/09/2024   2:45 PM EDT To: Torrence CINDERELLA Barrier, MD

## 2024-06-09 NOTE — Progress Notes (Signed)
 Acute Office Visit  Subjective:     Patient ID: Jamie Foster, male    DOB: 08/08/1966, 58 y.o.   MRN: [id number]  Chief Complaint  Patient presents with   Cough    Cough Associated symptoms include shortness of breath.   Patient is in today for acute visit.  Cough Pt presents for cough x 2 weeks with nasal congestion. Painful to cough in his chest. He has tried nyquil and dayquil which helped the cough. He has a lot of mucus production. He's been taking Advil  and Tylenol  which has helped. Pt reports he has these recurrent symptoms and would like to see pulmonologist.  Review of Systems  Respiratory:  Positive for cough and shortness of breath.   All other systems reviewed and are negative.       Objective:    BP 107/75   Pulse 97   Temp 97.9 F (36.6 C) (Oral)   Resp 18   Ht 6' 3 (1.905 m)   Wt 235 lb (106.6 kg)   SpO2 100%   BMI 29.37 kg/m    Physical Exam Vitals and nursing note reviewed.  Constitutional:      Appearance: Normal appearance. He is normal weight.  HENT:     Head: Normocephalic and atraumatic.     Right Ear: Tympanic membrane, ear canal and external ear normal.     Left Ear: Tympanic membrane, ear canal and external ear normal.     Nose: Nose normal.     Mouth/Throat:     Mouth: Mucous membranes are moist.     Pharynx: Oropharynx is clear.  Eyes:     Conjunctiva/sclera: Conjunctivae normal.     Pupils: Pupils are equal, round, and reactive to light.  Cardiovascular:     Rate and Rhythm: Normal rate and regular rhythm.     Pulses: Normal pulses.     Heart sounds: Normal heart sounds.  Pulmonary:     Effort: Pulmonary effort is normal.     Breath sounds: Normal breath sounds.  Abdominal:     General: Abdomen is flat. Bowel sounds are normal.  Skin:    General: Skin is warm.     Capillary Refill: Capillary refill takes less than 2 seconds.  Neurological:     General: No focal deficit present.     Mental Status: He is alert and  oriented to person, place, and time. Mental status is at baseline.  Psychiatric:        Mood and Affect: Mood normal.        Behavior: Behavior normal.        Thought Content: Thought content normal.        Judgment: Judgment normal.     No results found for any visits on 06/09/24.      Assessment & Plan:   Problem List Items Addressed This Visit   None Visit Diagnoses       Acute cough    -  Primary   Relevant Medications   methylPREDNISolone  (MEDROL  DOSEPAK) 4 MG TBPK tablet   promethazine -dextromethorphan (PROMETHAZINE -DM) 6.25-15 MG/5ML syrup   Other Relevant Orders   DG Chest 2 View   Ambulatory referral to Pulmonology      Acute cough -     DG Chest 2 View; Future -     methylPREDNISolone ; Standard 6-day pack.  Take by mouth as directed on package.  Dispense: 21 tablet; Refill: 0 -     Promethazine -DM; Take 5 mLs by mouth  at bedtime as needed for cough.  Dispense: 118 mL; Refill: 0 -     Pulmonary Visit   Pt with acute on chronic recurrent cough.  Send to pulmonologist per request Send for CXR and Medrol  dose pack along with cough syrup at night prn.   Meds ordered this encounter  Medications   methylPREDNISolone  (MEDROL  DOSEPAK) 4 MG TBPK tablet    Sig: Standard 6-day pack.  Take by mouth as directed on package.    Dispense:  21 tablet    Refill:  0   promethazine -dextromethorphan (PROMETHAZINE -DM) 6.25-15 MG/5ML syrup    Sig: Take 5 mLs by mouth at bedtime as needed for cough.    Dispense:  118 mL    Refill:  0    Return for next scheduled.  Torrence CINDERELLA Barrier, MD

## 2024-06-09 NOTE — Telephone Encounter (Signed)
 Spoke with patient and informed him of his chest X-ray results. Patient states that it is ok with him to be referred to [facility]. Patient had asked if he could get an MRI ?  Please Advise

## 2024-06-10 ENCOUNTER — Other Ambulatory Visit (HOSPITAL_BASED_OUTPATIENT_CLINIC_OR_DEPARTMENT_OTHER): Payer: Self-pay

## 2024-06-10 NOTE — Telephone Encounter (Signed)
 Spoke with patient and informed him of PCP,s recommendation , patient gave a verbal understanding and states that he will wait to hear from Lung Specialist.

## 2024-06-16 ENCOUNTER — Ambulatory Visit: Payer: Self-pay

## 2024-06-16 ENCOUNTER — Ambulatory Visit: Admitting: Family Medicine

## 2024-06-16 ENCOUNTER — Encounter: Payer: Self-pay | Admitting: Family Medicine

## 2024-06-16 ENCOUNTER — Ambulatory Visit

## 2024-06-16 VITALS — BP 157/78 | HR 101 | Temp 98.4°F | Resp 18 | Ht 75.0 in | Wt 235.0 lb

## 2024-06-16 DIAGNOSIS — R053 Chronic cough: Secondary | ICD-10-CM

## 2024-06-16 DIAGNOSIS — J9811 Atelectasis: Secondary | ICD-10-CM | POA: Diagnosis not present

## 2024-06-16 DIAGNOSIS — K219 Gastro-esophageal reflux disease without esophagitis: Secondary | ICD-10-CM

## 2024-06-16 LAB — I-STAT CREATININE (MANUAL ENTRY): Creatinine, Ser: 1.5 — AB (ref 0.50–1.10)

## 2024-06-16 MED ORDER — IOHEXOL 300 MG/ML  SOLN
100.0000 mL | Freq: Once | INTRAMUSCULAR | Status: AC | PRN
  Administered 2024-06-16: 75 mL via INTRAVENOUS

## 2024-06-16 NOTE — Progress Notes (Signed)
 Established Patient Office Visit  Subjective   Patient ID: Jamie Foster, male    DOB: 12/21/1965  Age: 58 y.o. MRN: [id number]  Chief Complaint  Patient presents with   Cough    Patient states that he has had this cough for weeks and its not getting better    Cough Associated symptoms include shortness of breath.    Cough Pt was seen on 7/17 for acute cough. Sent in Medrol  dose pack and sent for xrays. Xrays negative. Referral to Pulmonology was placed due to recurrent episodes of cough. He was also given promethazine  cough syrup. Pt is here today for worsening cough that's dry. Has mucus that comes up in his throat. Also reports midepigastric pain and SOB. He is seeing GI who added him on Pepcid  20mg  BID. He reports he stopped his Omeprazole  40mg  BID due to 'it being the same medicines'.    Review of Systems  Respiratory:  Positive for cough and shortness of breath.   Gastrointestinal:  Positive for abdominal pain.  All other systems reviewed and are negative.     Objective:     BP (!) 157/78   Pulse (!) 101   Temp 98.4 F (36.9 C) (Oral)   Resp 18   Ht 6' 3 (1.905 m)   Wt 235 lb (106.6 kg)   SpO2 99%   BMI 29.37 kg/m  BP Readings from Last 3 Encounters:  06/16/24 (!) 157/78  06/09/24 107/75  05/18/24 (!) 160/90      Physical Exam Vitals and nursing note reviewed.  Constitutional:      Appearance: Normal appearance. He is normal weight.  HENT:     Head: Normocephalic and atraumatic.     Right Ear: External ear normal.     Left Ear: External ear normal.     Nose: Nose normal.     Mouth/Throat:     Mouth: Mucous membranes are moist.     Pharynx: Oropharynx is clear.  Eyes:     Conjunctiva/sclera: Conjunctivae normal.     Pupils: Pupils are equal, round, and reactive to light.  Cardiovascular:     Rate and Rhythm: Regular rhythm. Tachycardia present.     Pulses: Normal pulses.     Heart sounds: Normal heart sounds.  Pulmonary:     Effort:  Pulmonary effort is normal.     Breath sounds: Normal breath sounds.  Abdominal:     General: Abdomen is flat. Bowel sounds are normal.     Tenderness: There is abdominal tenderness.  Skin:    General: Skin is warm.     Capillary Refill: Capillary refill takes less than 2 seconds.  Neurological:     General: No focal deficit present.     Mental Status: He is alert and oriented to person, place, and time. Mental status is at baseline.  Psychiatric:        Mood and Affect: Mood normal.        Behavior: Behavior normal.        Thought Content: Thought content normal.        Judgment: Judgment normal.      No results found for any visits on 06/16/24.     The 10-year ASCVD risk score (Arnett DK, et al., 2019) is: 28.6%    Assessment & Plan:   Problem List Items Addressed This Visit       Digestive   GERD (gastroesophageal reflux disease)   Other Visit Diagnoses  Chronic cough    -  Primary   Relevant Orders   CT Chest W Contrast      Chronic cough -     CT CHEST W CONTRAST; Future  Gastroesophageal reflux disease without esophagitis   Pt with chronic cough with hx of GERD, SOB, abdominal pain, and mucus production. Cxr normal. Would like to proceed with CT Chest to rule out hiatal hernia. Advised to start back Omeprazole  40mg  BID for GERD which could be contributing to his cough. This will be in addition to Pepcid  20mg  BID. To follow up with GI in a few weeks as scheduled.   No follow-ups on file.    Torrence CINDERELLA Barrier, MD  Total time spent with patient today 21 minutes. This includes reviewing records, evaluating the patient and coordinating care. Face-to-face time >50%.

## 2024-06-16 NOTE — Telephone Encounter (Signed)
 FYI Only or Action Required?: FYI only for provider.  Patient was last seen in primary care on 06/09/2024 by Colette Torrence GRADE, MD.  Called Nurse Triage reporting Cough.  Symptoms began a week ago.  Interventions attempted: Prescription medications: Prednisone , Promethazine -DM and Rest, hydration, or home remedies.  Symptoms are: unchanged.  Triage Disposition: See Physician Within 24 Hours-patient requested appointment today-scheduled for 2:10 PM for acute appointment  Patient/caregiver understands and will follow disposition?: Yes  Copied from CRM #[id number]. Topic: Clinical - Red Word Triage >> Jun 16, 2024  9:03 AM Carlatta H wrote: Red Word that prompted transfer to Nurse Triage: Patient was seen for cough on 7/17 but cough has gotten worse// Reason for Disposition  SEVERE coughing spells (e.g., whooping sound after coughing, vomiting after coughing)  Answer Assessment - Initial Assessment Questions 1. ONSET: When did the cough begin?      Last week 2. SEVERITY: How bad is the cough today?      10 3. SPUTUM: Describe the color of your sputum (e.g., none, dry cough; clear, white, yellow, green)     Clear sputum 4. HEMOPTYSIS: Are you coughing up any blood? If Yes, ask: How much? (e.g., flecks, streaks, tablespoons, etc.)     no 5. DIFFICULTY BREATHING: Are you having difficulty breathing? If Yes, ask: How bad is it? (e.g., mild, moderate, severe)      When he is coughing-patient reports shortness of breath 6. FEVER: Do you have a fever? If Yes, ask: What is your temperature, how was it measured, and when did it start?     no 7. CARDIAC HISTORY: Do you have any history of heart disease? (e.g., heart attack, congestive heart failure)      no 8. LUNG HISTORY: Do you have any history of lung disease?  (e.g., pulmonary embolus, asthma, emphysema)     no 9. PE RISK FACTORS: Do you have a history of blood clots? (or: recent major surgery, recent prolonged  travel, bedridden)     no 10. OTHER SYMPTOMS: Do you have any other symptoms? (e.g., runny nose, wheezing, chest pain)       no 12. TRAVEL: Have you traveled out of the country in the last month? (e.g., travel history, exposures)       no  Protocols used: Cough - Acute Productive-A-AH

## 2024-06-17 ENCOUNTER — Ambulatory Visit
Admission: EM | Admit: 2024-06-17 | Discharge: 2024-06-17 | Disposition: A | Discharge status: Home / Self Care | Attending: Family Medicine | Admitting: Family Medicine

## 2024-06-17 ENCOUNTER — Ambulatory Visit: Payer: Self-pay

## 2024-06-17 ENCOUNTER — Ambulatory Visit: Payer: Self-pay | Admitting: Family Medicine

## 2024-06-17 DIAGNOSIS — R059 Cough, unspecified: Secondary | ICD-10-CM

## 2024-06-17 DIAGNOSIS — J069 Acute upper respiratory infection, unspecified: Secondary | ICD-10-CM | POA: Diagnosis not present

## 2024-06-17 DIAGNOSIS — J309 Allergic rhinitis, unspecified: Secondary | ICD-10-CM

## 2024-06-17 MED ORDER — FEXOFENADINE HCL 180 MG PO TABS: 180.0000 mg | ORAL_TABLET | Freq: Every day | ORAL | 0 refills | Status: DC

## 2024-06-17 MED ORDER — AMOXICILLIN-POT CLAVULANATE 875-125 MG PO TABS: 1.0000 | ORAL_TABLET | Freq: Two times a day (BID) | ORAL | 0 refills | Status: DC

## 2024-06-17 MED ORDER — HYDROCODONE BIT-HOMATROP MBR 5-1.5 MG/5ML PO SOLN: 5.0000 mL | Freq: Four times a day (QID) | ORAL | 0 refills | Status: DC | PRN

## 2024-06-17 NOTE — Telephone Encounter (Signed)
 FYI Only or Action Required?: Action required by provider: medication for cough.  Patient was last seen in primary care on 06/16/2024 by Colette Torrence GRADE, MD.  Called Nurse Triage reporting Cough.  Symptoms began a week ago.  Interventions attempted: Rest, hydration, or home remedies and Other: reports using OTC medications but has had no success.  Symptoms are: unchanged.  Triage Disposition: See Physician Within 24 Hours-patient was seen yesterday-wanting provider to send medication for his continued symptoms.   Patient/caregiver understands and will follow disposition?: No, wishes to speak with PCP  Copied from CRM #[id number]. Topic: Clinical - Red Word Triage >> Jun 17, 2024 12:18 PM Rachelle R wrote: [facility] that prompted transfer to Nurse Triage: Patient states he hasn't felt good for the past two weeks. Symptoms include productive cough, runny nose, discolored mucous, and sneezing. Reason for Disposition  SEVERE coughing spells (e.g., whooping sound after coughing, vomiting after coughing)  Answer Assessment - Initial Assessment Questions 1. ONSET: When did the cough begin?      Patient states cough started up last week 2. SEVERITY: How bad is the cough today?      Moderate to severe 3. SPUTUM: Describe the color of your sputum (e.g., none, dry cough; clear, white, yellow, green)     Clear  4. HEMOPTYSIS: Are you coughing up any blood? If Yes, ask: How much? (e.g., flecks, streaks, tablespoons, etc.)     no 5. DIFFICULTY BREATHING: Are you having difficulty breathing? If Yes, ask: How bad is it? (e.g., mild, moderate, severe)      no 6. FEVER: Do you have a fever? If Yes, ask: What is your temperature, how was it measured, and when did it start?     no 7. CARDIAC HISTORY: Do you have any history of heart disease? (e.g., heart attack, congestive heart failure)      no 8. LUNG HISTORY: Do you have any history of lung disease?  (e.g., pulmonary embolus,  asthma, emphysema)     no 9. PE RISK FACTORS: Do you have a history of blood clots? (or: recent major surgery, recent prolonged travel, bedridden)     no 10. OTHER SYMPTOMS: Do you have any other symptoms? (e.g., runny nose, wheezing, chest pain)       Runny nose, sneezing 12. TRAVEL: Have you traveled out of the country in the last month? (e.g., travel history, exposures)       no  Patient was triaged yesterday for similar symptoms and patient was evaluated in office. Patient states he was unable to sleep due to coughing and called back with continued complaints of cough, runny nose, and sneezing. Patient reports OTC medication not helping. Patient is asking if the provider can send medication to his pharmacy. Patient is asking for a phone call for an update from office.  Protocols used: Cough - Acute Productive-A-AH

## 2024-06-17 NOTE — ED Provider Notes (Signed)
 [facility]    CSN: [id number] Arrival date & time: 06/17/24  1821      History   Chief Complaint Chief Complaint  Patient presents with   Cough    Cough, chest congestion and body aches    HPI Jamie Foster is a 58 y.o. male.   HPI 58 year old male presents with persistent cough.  Patient has been followed by his PCP for this and had CT of chest performed with contrast yesterday.  PMH significant for T2DM without complication, sleep apnea, and HTN.  Past Medical History:  Diagnosis Date   Allergy    Diabetes mellitus without complication (HCC)    H pylori ulcer    Hyperlipidemia    Hypertension    Kidney stone    Sleep apnea     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 macular edema associated with type 2 diabetes mellitus (HCC) 08/21/2022   LPRD (laryngopharyngeal reflux disease) 07/24/2022   Tympanic membrane disorder, left 07/24/2022   Uncontrolled type 2 diabetes mellitus with hyperglycemia (HCC) 08/20/2020   Hyperlipidemia 01/23/2019   Low back pain without sciatica 01/23/2019   Uncontrolled type 2 diabetes mellitus with retinopathy, without long-term current use of insulin  12/21/2015   Family hx of prostate cancer 06/22/2013   Allergic rhinitis 06/22/2013   OSA (obstructive sleep apnea) 10/06/2012   BMI 30.0-30.9,adult 07/19/2012   HTN (hypertension) 07/19/2012   GERD (gastroesophageal reflux disease) 07/19/2012   DDD (degenerative disc disease), cervical 07/19/2012    Past Surgical History:  Procedure Laterality Date   NASAL SINUS SURGERY     deviated septum       Home Medications    Prior to Admission medications   Medication Sig Start Date End Date Taking? Authorizing Provider  albuterol  (VENTOLIN  HFA) 108 (90 Base) MCG/ACT inhaler Inhale 2 puffs into the lungs every 6 (six) hours as needed  for wheezing or shortness of breath. 01/22/24  Yes Rucker, Torrence GRADE, MD  amoxicillin -clavulanate (AUGMENTIN ) 875-125 MG tablet Take 1 tablet by mouth every 12 (twelve) hours. 06/17/24  Yes Teddy Sharper, FNP  Continuous Glucose Sensor (DEXCOM G7 SENSOR) MISC Apply 1 sensor to skin as directed then change every 10 days 03/18/23  Yes   Continuous Glucose Sensor (FREESTYLE LIBRE 2 SENSOR) MISC Apply 1 sensor once every 14 days 09/13/22  Yes   Continuous Glucose Sensor (FREESTYLE LIBRE 3 PLUS SENSOR) MISC Change sensor every 14 (fourteen) days. 10/12/23  Yes Rucker, Torrence GRADE, MD  Continuous Glucose Sensor (FREESTYLE LIBRE 3 PLUS SENSOR) MISC Apply 1 sensor as directed to check blood sugar and change every 15 (fifteen) days. 02/18/24  Yes   Continuous Glucose Sensor (FREESTYLE LIBRE 3 PLUS SENSOR) MISC Apply 1 sensor as directed to check blood sugar and change every 15 (fifteen) days. 02/18/24  Yes   dapagliflozin  propanediol (FARXIGA ) 10 MG TABS tablet Take 1 tablet by mouth every morning 07/22/23  Yes Rucker, Torrence GRADE, MD  famotidine  (PEPCID ) 20 MG tablet Take 1 tablet (20 mg total) by mouth 2 (two) times daily. 05/18/24  Yes May, Deanna J, NP  fexofenadine (ALLEGRA ALLERGY) 180 MG tablet Take 1 tablet (180 mg total) by mouth daily for 15 days. 06/17/24 07/02/24 Yes Teddy Sharper, FNP  glucose blood (TRUETRACK TEST) test strip Use 2x a day 05/17/18  Yes Levora Reyes SAUNDERS, MD  GLUCOTROL  XL  5 MG 24 hr tablet Take 1 tablet (5 mg total) by mouth 2 (two) times daily. 05/09/24  Yes Rucker, Torrence GRADE, MD  HYDROcodone  bit-homatropine (HYCODAN) 5-1.5 MG/5ML syrup Take 5 mLs by mouth every 6 (six) hours as needed for cough. 06/17/24  Yes Teddy Sharper, FNP  insulin  glargine (LANTUS  SOLOSTAR) 100 UNIT/ML Solostar Pen Inject 18 Units into the skin at bedtime. 05/04/23  Yes Rucker, Torrence GRADE, MD  Insulin  Pen Needle 32G X 4 MM MISC Use as directed 1 time a day. 10/12/23  Yes Rucker, Torrence GRADE, MD  Insulin  Pen Needle 32G X 4  MM MISC Use as directed 1 time a day. 05/30/24  Yes Rucker, Torrence GRADE, MD  ketoconazole  (NIZORAL ) 2 % cream Apply liberally to affected area once a day. 04/30/24 04/30/25 Yes   losartan  (COZAAR ) 100 MG tablet Take 1 tablet (100 mg total) by mouth daily. 04/29/24  Yes Rucker, Torrence GRADE, MD  montelukast  (SINGULAIR ) 10 MG tablet Take 1 tablet (10 mg total) by mouth at bedtime. 09/22/23  Yes Rucker, Torrence GRADE, MD  Olopatadine  HCl 0.2 % SOLN Apply 1 drop to eye daily as needed. 05/04/23  Yes Rucker, Torrence GRADE, MD  omeprazole  (PRILOSEC) 40 MG capsule Take 1 capsule (40 mg total) by mouth 2 (two) times daily. 01/22/24 10/05/25 Yes Rucker, Torrence GRADE, MD  [facility] DELICA LANCETS 33G MISC Use to test blood sugar 2 times daily as instructed. 01/24/15  Yes Trixie File, MD  rosuvastatin  (CRESTOR ) 10 MG tablet Take 1 tablet (10 mg total) by mouth daily. 01/22/24  Yes Rucker, Torrence GRADE, MD  Semaglutide , 1 MG/DOSE, (OZEMPIC , 1 MG/DOSE,) 4 MG/3ML SOPN Inject 1 mg into the skin once a week. 04/29/24  Yes Colette Torrence GRADE, MD  triamcinolone  (NASACORT ) 55 MCG/ACT AERO nasal inhaler Place 2 squirts in each nostril at night before bedtime 08/26/23  Yes Rucker, Torrence GRADE, MD  triamcinolone  cream (KENALOG ) 0.1 % Apply 1 Application topically 2 (two) times daily as needed. 02/22/24  Yes Rucker, Torrence GRADE, MD  GARR LANCETS MISC Use 2x a day - Tru Track 06/09/14  Yes Trixie File, MD    Family History Family History  Problem Relation Age of Onset   Hypertension Mother    Prostate cancer Father    Colon cancer Neg Hx    Colon polyps Neg Hx     Social History Social History   Tobacco Use   Smoking status: Never   Smokeless tobacco: Never  Vaping Use   Vaping status: Never Used  Substance Use Topics   Alcohol use: No   Drug use: No     Allergies   Lisinopril  and Metformin  and related   Review of Systems Review of Systems  HENT:  Positive for congestion.   Respiratory:  Positive for cough and chest  tightness.      Physical Exam Triage Vital Signs ED Triage Vitals  Encounter Vitals Group     BP      Girls Systolic BP Percentile      Girls Diastolic BP Percentile      Boys Systolic BP Percentile      Boys Diastolic BP Percentile      Pulse      Resp      Temp      Temp src      SpO2      Weight      Height      Head Circumference      Peak Flow  Pain Score      Pain Loc      Pain Education      Exclude from Growth Chart    No data found.  Updated Vital Signs BP (!) 170/102 (BP Location: Right Arm)   Pulse (!) 101   Temp 99.3 F (37.4 C) (Oral)   Resp 17   Ht 6' 3 (1.905 m)   Wt 235 lb (106.6 kg)   SpO2 95%   BMI 29.37 kg/m    Physical Exam Vitals and nursing note reviewed.  Constitutional:      Appearance: Normal appearance. He is obese. He is ill-appearing.  HENT:     Head: Normocephalic and atraumatic.     Right Ear: Tympanic membrane, ear canal and external ear normal.     Left Ear: Tympanic membrane, ear canal and external ear normal.     Mouth/Throat:     Mouth: Mucous membranes are moist.     Pharynx: Oropharynx is clear.     Comments: Significant amount of clear drainage of posterior oropharynx noted Eyes:     Extraocular Movements: Extraocular movements intact.     Conjunctiva/sclera: Conjunctivae normal.     Pupils: Pupils are equal, round, and reactive to light.  Cardiovascular:     Rate and Rhythm: Normal rate and regular rhythm.     Pulses: Normal pulses.     Heart sounds: Normal heart sounds. No murmur heard. Pulmonary:     Effort: Pulmonary effort is normal. No respiratory distress.     Breath sounds: Normal breath sounds. No stridor. No wheezing, rhonchi or rales.     Comments: Infrequent nonproductive cough on exam Musculoskeletal:        General: Normal range of motion.     Cervical back: Normal range of motion and neck supple.  Skin:    General: Skin is warm and dry.  Neurological:     General: No focal deficit  present.     Mental Status: He is alert and oriented to person, place, and time. Mental status is at baseline.  Psychiatric:        Mood and Affect: Mood normal.        Behavior: Behavior normal.      UC Treatments / Results  Labs (all labs ordered are listed, but only abnormal results are displayed) Labs Reviewed - No data to display  EKG   Radiology CT Chest W Contrast Result Date: 06/16/2024 CLINICAL DATA:  Persistent cough EXAM: CT CHEST WITH CONTRAST TECHNIQUE: Multidetector CT imaging of the chest was performed during intravenous contrast administration. RADIATION DOSE REDUCTION: This exam was performed according to the departmental dose-optimization program which includes automated exposure control, adjustment of the mA and/or kV according to patient size and/or use of iterative reconstruction technique. CONTRAST:  75mL OMNIPAQUE  IOHEXOL  300 MG/ML  SOLN COMPARISON:  Chest x-ray 06/09/2024 FINDINGS: Cardiovascular: Nonaneurysmal aorta. Normal cardiac size. No pericardial effusion Mediastinum/Nodes: Patent trachea. No thyroid  mass. No suspicious lymph nodes. Esophagus within normal limits. Lungs/Pleura: No consolidation, pleural effusion or pneumothorax. Mild atelectasis within the dependent lungs. Upper Abdomen: No acute finding Musculoskeletal: No acute osseous abnormality IMPRESSION: No CT evidence for acute intrathoracic abnormality. Mild atelectasis within the dependent lungs. Electronically Signed   By: Luke Bun M.D.   On: 06/16/2024 16:22    Procedures Procedures (including critical care time)  Medications Ordered in UC Medications - No data to display  Initial Impression / Assessment and Plan / UC Course  I have reviewed the  triage vital signs and the nursing notes.  Pertinent labs & imaging results that were available during my care of the patient were reviewed by me and considered in my medical decision making (see chart for details).     Please note CT of chest  with contrast ordered on 06/16/2024 populated with my note today.  MDM: 1.  Acute URI-Rx'd Augmentin  875/125 mg tablet: Take 1 tablet twice daily x 7 days; 2.  Cough, unspecified type-Hycodan 5-1.5 mg / 5 mL syrup: Take 5 mL every 6 hours, as needed for cough; 3.  Allergic rhinitis, unspecified seasonality, unspecified trigger-Rx'd Allegra 180 mg tablet: Take 1 tablet daily x 5 days. Advised patient to take medication as directed with food to completion.  Advised patient to take Allegra with first dose of Augmentin  for the next 5 of 7 days.  Advised may use Allegra as needed afterwards for concurrent postnasal drainage/drip.  Advised may use Hycodan cough syrup prior to sleep for cough due to sedative effects.  Encouraged to increase daily water intake to 64 ounces per day while taking these medications.  Advised symptoms worsen and/or unresolved please follow-up with PCP or here for further evaluation.  Patient discharged home, hemodynamically stable. Final Clinical Impressions(s) / UC Diagnoses   Final diagnoses:  Acute URI  Cough, unspecified type  Allergic rhinitis, unspecified seasonality, unspecified trigger     Discharge Instructions      Advised patient to take medication as directed with food to completion.  Advised patient to take Allegra with first dose of Augmentin  for the next 5 of 7 days.  Advised may use Allegra as needed afterwards for concurrent postnasal drainage/drip.  Advised may use Hycodan cough syrup prior to sleep for cough due to sedative effects.  Encouraged to increase daily water intake to 64 ounces per day while taking these medications.  Advised symptoms worsen and/or unresolved please follow-up with PCP or here for further evaluation.     ED Prescriptions     Medication Sig Dispense Auth. Provider   amoxicillin -clavulanate (AUGMENTIN ) 875-125 MG tablet Take 1 tablet by mouth every 12 (twelve) hours. 14 tablet Glendy Barsanti, FNP   fexofenadine (ALLEGRA ALLERGY)  180 MG tablet Take 1 tablet (180 mg total) by mouth daily for 15 days. 15 tablet Brooklinn Longbottom, FNP   HYDROcodone  bit-homatropine (HYCODAN) 5-1.5 MG/5ML syrup Take 5 mLs by mouth every 6 (six) hours as needed for cough. 120 mL Teddy Sharper, FNP      I have reviewed the PDMP during this encounter.   Teddy Sharper, FNP 06/17/24 [phone]

## 2024-06-17 NOTE — Telephone Encounter (Signed)
 Spoke with patient regarding CT results and also advised him to continue  taking medication ( Omeprazole  ) along with the Pepcid . He is also aware of providers instructions to follow up with his GI doctor as well for pain.

## 2024-06-17 NOTE — Discharge Instructions (Addendum)
 Advised patient to take medication as directed with food to completion.  Advised patient to take Allegra with first dose of Augmentin  for the next 5 of 7 days.  Advised may use Allegra as needed afterwards for concurrent postnasal drainage/drip.  Advised may use Hycodan cough syrup prior to sleep for cough due to sedative effects.  Encouraged to increase daily water intake to 64 ounces per day while taking these medications.  Advised symptoms worsen and/or unresolved please follow-up with PCP or here for further evaluation.

## 2024-06-17 NOTE — ED Triage Notes (Signed)
 Pt states that he has a cough, chest congestion and body aches. X2 weeks

## 2024-06-20 ENCOUNTER — Ambulatory Visit: Payer: Self-pay

## 2024-06-20 ENCOUNTER — Other Ambulatory Visit: Payer: Self-pay

## 2024-06-20 ENCOUNTER — Ambulatory Visit
Admission: RE | Admit: 2024-06-20 | Discharge: 2024-06-20 | Disposition: A | Discharge status: Home / Self Care | Source: Ambulatory Visit | Attending: Family Medicine | Admitting: Family Medicine

## 2024-06-20 VITALS — BP 157/103 | HR 99 | Temp 98.8°F | Resp 18

## 2024-06-20 DIAGNOSIS — R053 Chronic cough: Secondary | ICD-10-CM

## 2024-06-20 HISTORY — DX: Gastro-esophageal reflux disease without esophagitis: K21.9

## 2024-06-20 MED ORDER — BENZONATATE 200 MG PO CAPS: 200.0000 mg | ORAL_CAPSULE | Freq: Three times a day (TID) | ORAL | 0 refills | Status: DC | PRN

## 2024-06-20 MED ORDER — DOXYCYCLINE HYCLATE 100 MG PO CAPS: 100.0000 mg | ORAL_CAPSULE | Freq: Two times a day (BID) | ORAL | 0 refills | Status: DC

## 2024-06-20 MED ORDER — IBUPROFEN 600 MG PO TABS: 600.0000 mg | ORAL_TABLET | Freq: Four times a day (QID) | ORAL | 0 refills | Status: DC | PRN

## 2024-06-20 MED ORDER — IPRATROPIUM-ALBUTEROL 0.5-2.5 (3) MG/3ML IN SOLN
3.0000 mL | Freq: Once | RESPIRATORY_TRACT | Status: AC
  Administered 2024-06-20: 3 mL via RESPIRATORY_TRACT

## 2024-06-20 NOTE — ED Triage Notes (Addendum)
 Cough for over a month. As of Friday cough was productive. Reports has had mucus coming out of nose, sometimes green or brown. When he sneezes he feels dizzy. No fever. No otc meds. Recent ct of chest.

## 2024-06-20 NOTE — Telephone Encounter (Signed)
 FYI Only or Action Required?: FYI only for provider.  Patient was last seen in primary care on 06/16/2024 by Colette Torrence GRADE, MD.  Called Nurse Triage reporting Cough.  Symptoms began several months ago.  Interventions attempted: Prescription medications: Augmentin  and hydrocodone  cough syrup.  Symptoms are: gradually worsening.  Triage Disposition: See PCP When Office is Open (Within 3 Days)  Patient/caregiver understands and will follow disposition?: Yes                             Copied from CRM [id number]. Topic: Clinical - Red Word Triage >> Jun 20, 2024  4:06 PM Antwanette L wrote: Red Word that prompted transfer to Nurse Triage: Pt is coughing up a clear phelgm. Pt said he his having pain whenever he coughs. Pt  said whenever he blows his nose different colors comes out. Reason for Disposition  Cough has been present for > 3 weeks  Answer Assessment - Initial Assessment Questions 1. ONSET: When did the cough begin?      Awhile, estimates months 2. SEVERITY: How bad is the cough today?      Moderate- coughing spells 3. SPUTUM: Describe the color of your sputum (e.g., none, dry cough; clear, white, yellow, green)     Yellow with dark spots 5. DIFFICULTY BREATHING: Are you having difficulty breathing? If Yes, ask: How bad is it? (e.g., mild, moderate, severe)      Difficulty breathing when coughing, denies SOB when not coughing, patient able to speak in clear and complete sentences while on phone with this RN 6. FEVER: Do you have a fever? If Yes, ask: What is your temperature, how was it measured, and when did it start?     Denies 7. CARDIAC HISTORY: Do you have any history of heart disease? (e.g., heart attack, congestive heart failure)      Denies  8. LUNG HISTORY: Do you have any history of lung disease?  (e.g., pulmonary embolus, asthma, emphysema)     Denies 10. OTHER SYMPTOMS: Do you have any other symptoms? (e.g., runny  nose, wheezing, chest pain)     States coughing is hurting lungs, abdominal pain   Patient was seen in UC on Friday and prescribed Augmentin  and hydrocodone  cough syrup. Patient stated these medications only provide temporary relief and his lungs are starting to hurt from the cough.  Protocols used: Cough - Acute Productive-A-AH

## 2024-06-20 NOTE — Telephone Encounter (Signed)
 Pt called after hours on Friday. Per chart review, he ended up going to urgent care and receiving cough medicine at that time.

## 2024-06-20 NOTE — Telephone Encounter (Signed)
 I spoke with the patient and it was okay to cancel the appointment with Darice Brownie. He wanted to keep the appointment for June 23, 2024 with you. He didn't want me to give up the appointment slot for tomorrow because he wasn't sure when he would be getting out of Acute care, he would let us  know tomorrow.

## 2024-06-20 NOTE — ED Provider Notes (Signed)
 [facility]    CSN: [id number] Arrival date & time: 06/20/24  1628      History   Chief Complaint Chief Complaint  Patient presents with   Cough    Entered by patient    HPI Jamie Foster is a 58 y.o. male.   HPI  Complicated patient with acute on chronic cough.  He has seen his primary care doctor.  He has been treated with antibiotics.  He has seen gastroenterology.  It was presumed GERD.  With treatment of the GERD he has not seen improvement.  He continues to cough.  He is coughed so much that his chest is hurting.  He is coughing up sputum.  He feels like he has an infection on top of his usual chronic coughing.  He is pending referral to pulmonology.  Does not have underlying lung disease or asthma.  He is not a smoker.  States that the cough is more prominent when he is in [place]  and when [place] is [place].  He does feel short of breath.  He has not had improvement with albuterol .  He feels winded with exertion.  He has been seen multiple times.  He was recently seen on Friday of last week (3 days ago) and started on Augmentin .  He states he does not feel like it is working.  Past Medical History:  Diagnosis Date   Allergy    Diabetes mellitus without complication (HCC)    GERD (gastroesophageal reflux disease)    H pylori ulcer    Hyperlipidemia    Hypertension    Kidney stone    Sleep apnea     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 macular edema associated with type 2 diabetes mellitus (HCC) 08/21/2022   LPRD (laryngopharyngeal reflux disease) 07/24/2022   Tympanic membrane disorder, left 07/24/2022   Uncontrolled type 2 diabetes mellitus with hyperglycemia (HCC) 08/20/2020   Hyperlipidemia 01/23/2019   Low back pain without sciatica 01/23/2019   Uncontrolled type 2 diabetes mellitus with retinopathy, without  long-term current use of insulin  12/21/2015   Family hx of prostate cancer 06/22/2013   Allergic rhinitis 06/22/2013   OSA (obstructive sleep apnea) 10/06/2012   BMI 30.0-30.9,adult 07/19/2012   HTN (hypertension) 07/19/2012   GERD (gastroesophageal reflux disease) 07/19/2012   DDD (degenerative disc disease), cervical 07/19/2012    Past Surgical History:  Procedure Laterality Date   NASAL SINUS SURGERY     deviated septum       Home Medications    Prior to Admission medications   Medication Sig Start Date End Date Taking? Authorizing Provider  benzonatate  (TESSALON ) 200 MG capsule Take 1 capsule (200 mg total) by mouth 3 (three) times daily as needed for cough. 06/20/24  Yes Maranda Jamee Jacob, MD  doxycycline  (VIBRAMYCIN ) 100 MG capsule Take 1 capsule (100 mg total) by mouth 2 (two) times daily. 06/20/24  Yes Maranda Jamee Jacob, MD  ibuprofen  (ADVIL ) 600 MG tablet Take 1 tablet (600 mg total) by mouth every 6 (six) hours as needed. 06/20/24  Yes Maranda Jamee Jacob, MD  albuterol  (VENTOLIN  HFA) 108 [phone] Base) MCG/ACT inhaler Inhale 2 puffs into the lungs every 6 (six) hours as needed for wheezing or shortness of breath. 01/22/24   Colette Torrence GRADE, MD  amoxicillin -clavulanate (AUGMENTIN ) 875-125 MG tablet Take 1 tablet by mouth every  12 (twelve) hours. 06/17/24   Teddy Sharper, FNP  Continuous Glucose Sensor (DEXCOM G7 SENSOR) MISC Apply 1 sensor to skin as directed then change every 10 days 03/18/23     Continuous Glucose Sensor (FREESTYLE LIBRE 2 SENSOR) MISC Apply 1 sensor once every 14 days 09/13/22     Continuous Glucose Sensor (FREESTYLE LIBRE 3 PLUS SENSOR) MISC Change sensor every 14 (fourteen) days. 10/12/23   Colette Torrence GRADE, MD  Continuous Glucose Sensor (FREESTYLE LIBRE 3 PLUS SENSOR) MISC Apply 1 sensor as directed to check blood sugar and change every 15 (fifteen) days. 02/18/24     Continuous Glucose Sensor (FREESTYLE LIBRE 3 PLUS SENSOR) MISC Apply 1 sensor as directed to  check blood sugar and change every 15 (fifteen) days. 02/18/24     dapagliflozin  propanediol (FARXIGA ) 10 MG TABS tablet Take 1 tablet by mouth every morning 07/22/23   Colette Torrence GRADE, MD  famotidine  (PEPCID ) 20 MG tablet Take 1 tablet (20 mg total) by mouth 2 (two) times daily. 05/18/24   May, Deanna J, NP  fexofenadine  (ALLEGRA  ALLERGY) 180 MG tablet Take 1 tablet (180 mg total) by mouth daily for 15 days. 06/17/24 07/02/24  Teddy Sharper, FNP  glucose blood (TRUETRACK TEST) test strip Use 2x a day 05/17/18   Levora Reyes SAUNDERS, MD  GLUCOTROL  XL 5 MG 24 hr tablet Take 1 tablet (5 mg total) by mouth 2 (two) times daily. 05/09/24   Colette Torrence GRADE, MD  HYDROcodone  bit-homatropine (HYCODAN) 5-1.5 MG/5ML syrup Take 5 mLs by mouth every 6 (six) hours as needed for cough. 06/17/24   Teddy Sharper, FNP  insulin  glargine (LANTUS  SOLOSTAR) 100 UNIT/ML Solostar Pen Inject 18 Units into the skin at bedtime. 05/04/23   Colette Torrence GRADE, MD  Insulin  Pen Needle 32G X 4 MM MISC Use as directed 1 time a day. 10/12/23   Colette Torrence GRADE, MD  Insulin  Pen Needle 32G X 4 MM MISC Use as directed 1 time a day. 05/30/24   Colette Torrence GRADE, MD  ketoconazole  (NIZORAL ) 2 % cream Apply liberally to affected area once a day. 04/30/24 04/30/25    losartan  (COZAAR ) 100 MG tablet Take 1 tablet (100 mg total) by mouth daily. 04/29/24   Colette Torrence GRADE, MD  montelukast  (SINGULAIR ) 10 MG tablet Take 1 tablet (10 mg total) by mouth at bedtime. 09/22/23   Colette Torrence GRADE, MD  Olopatadine  HCl 0.2 % SOLN Apply 1 drop to eye daily as needed. 05/04/23   Colette Torrence GRADE, MD  omeprazole  (PRILOSEC) 40 MG capsule Take 1 capsule (40 mg total) by mouth 2 (two) times daily. 01/22/24 10/05/25  Colette Torrence GRADE, MD  [facility] DELICA LANCETS 33G MISC Use to test blood sugar 2 times daily as instructed. 01/24/15   Trixie File, MD  rosuvastatin  (CRESTOR ) 10 MG tablet Take 1 tablet (10 mg total) by mouth daily. 01/22/24   Colette Torrence GRADE, MD   Semaglutide , 1 MG/DOSE, (OZEMPIC , 1 MG/DOSE,) 4 MG/3ML SOPN Inject 1 mg into the skin once a week. 04/29/24   Colette Torrence GRADE, MD  triamcinolone  (NASACORT ) 55 MCG/ACT AERO nasal inhaler Place 2 squirts in each nostril at night before bedtime 08/26/23   Rucker, Alethea Y, MD  triamcinolone  cream (KENALOG ) 0.1 % Apply 1 Application topically 2 (two) times daily as needed. 02/22/24   Colette Torrence GRADE, MD  [facility] LANCETS MISC Use 2x a day - Tru Track 06/09/14   Trixie File, MD    Family History Family History  Problem Relation Age of Onset   Hypertension Mother    Prostate cancer Father    Colon cancer Neg Hx    Colon polyps Neg Hx     Social History Social History   Tobacco Use   Smoking status: Never   Smokeless tobacco: Never  Vaping Use   Vaping status: Never Used  Substance Use Topics   Alcohol use: No   Drug use: No     Allergies   Lisinopril  and Metformin  and related   Review of Systems Review of Systems  See HPI Physical Exam Triage Vital Signs ED Triage Vitals  Encounter Vitals Group     BP 06/20/24 1635 (!) 166/106     Girls Systolic BP Percentile --      Girls Diastolic BP Percentile --      Boys Systolic BP Percentile --      Boys Diastolic BP Percentile --      Pulse Rate 06/20/24 1635 (!) 102     Resp 06/20/24 1635 18     Temp 06/20/24 1635 98.8 F (37.1 C)     Temp src --      SpO2 06/20/24 1635 96 %     Weight --      Height --      Head Circumference --      Peak Flow --      Pain Score 06/20/24 1638 8     Pain Loc --      Pain Education --      Exclude from Growth Chart --    No data found.  Updated Vital Signs BP (!) 157/103   Pulse 99   Temp 98.8 F (37.1 C)   Resp 18   SpO2 96%      Physical Exam Constitutional:      General: He is not in acute distress.    Appearance: He is well-developed.  HENT:     Head: Normocephalic and atraumatic.     Right Ear: Tympanic membrane normal.     Left Ear: Tympanic membrane  normal.     Nose: Congestion and rhinorrhea present.     Mouth/Throat:     Pharynx: Posterior oropharyngeal erythema present.     Comments: Posterior pharyngeal erythema.  Clear postnasal drip.  Nasal membranes are swollen and red. Eyes:     Conjunctiva/sclera: Conjunctivae normal.     Pupils: Pupils are equal, round, and reactive to light.  Cardiovascular:     Rate and Rhythm: Normal rate and regular rhythm.     Heart sounds: Normal heart sounds.  Pulmonary:     Effort: Pulmonary effort is normal. No respiratory distress.     Breath sounds: Normal breath sounds.  Abdominal:     General: There is no distension.     Palpations: Abdomen is soft.  Musculoskeletal:        General: Normal range of motion.     Cervical back: Normal range of motion.  Skin:    General: Skin is warm and dry.  Neurological:     Mental Status: He is alert.      UC Treatments / Results  Labs (all labs ordered are listed, but only abnormal results are displayed) Labs Reviewed - No data to display  EKG   Radiology No results found.  Procedures Procedures (including critical care time)  Medications Ordered in UC Medications  ipratropium-albuterol  (DUONEB) 0.5-2.5 (3) MG/3ML nebulizer solution 3 mL (3 mLs Nebulization Given 06/20/24 1719)    Initial  Impression / Assessment and Plan / UC Course  I have reviewed the triage vital signs and the nursing notes.  Pertinent labs & imaging results that were available during my care of the patient were reviewed by me and considered in my medical decision making (see chart for details).     Trial of nebulized albuterol .  This did not help patient. Since Augmentin  does not help and we will give a trial of doxycycline .  This will help cover any acute infection. Will give Tessalon  for coughing Ibuprofen  for his rib pain Follow-up with primary care Final Clinical Impressions(s) / UC Diagnoses   Final diagnoses:  Chronic coughing     Discharge  Instructions      I have placed a referral to pulmonary medicine Switch antibiotics to doxycycline  2 times a day with food May take ibuprofen  3 times a day with food I have prescribed Tessalon  to take for coughing.  May take 2-3 times a day as needed Make sure you continue drinking lots of water Follow-up with Dr. Colette   ED Prescriptions     Medication Sig Dispense Auth. Provider   doxycycline  (VIBRAMYCIN ) 100 MG capsule Take 1 capsule (100 mg total) by mouth 2 (two) times daily. 20 capsule Maranda Jamee Jacob, MD   ibuprofen  (ADVIL ) 600 MG tablet Take 1 tablet (600 mg total) by mouth every 6 (six) hours as needed. 30 tablet Maranda Jamee Jacob, MD   benzonatate  (TESSALON ) 200 MG capsule Take 1 capsule (200 mg total) by mouth 3 (three) times daily as needed for cough. 30 capsule Maranda Jamee Jacob, MD      PDMP not reviewed this encounter.   Maranda Jamee Jacob, MD 06/20/24 [phone]

## 2024-06-20 NOTE — Discharge Instructions (Addendum)
 I have placed a referral to pulmonary medicine Switch antibiotics to doxycycline  2 times a day with food May take ibuprofen  3 times a day with food I have prescribed Tessalon  to take for coughing.  May take 2-3 times a day as needed Make sure you continue drinking lots of water Follow-up with Dr. Colette

## 2024-06-21 ENCOUNTER — Ambulatory Visit: Admitting: Family Medicine

## 2024-06-21 NOTE — Telephone Encounter (Signed)
Noted and aware.

## 2024-06-21 NOTE — Telephone Encounter (Signed)
 Spoke with patient and he stated that the doctor gave him a medication that made him feel a lot better, that he would just keep the appointment for June 23, 2024 with you.

## 2024-06-23 ENCOUNTER — Encounter: Payer: Self-pay | Admitting: Family Medicine

## 2024-06-23 ENCOUNTER — Encounter: Payer: Self-pay | Admitting: Gastroenterology

## 2024-06-23 ENCOUNTER — Ambulatory Visit: Admitting: Family Medicine

## 2024-06-23 VITALS — BP 155/89 | HR 95 | Temp 97.6°F | Resp 18 | Ht 75.0 in | Wt 230.2 lb

## 2024-06-23 DIAGNOSIS — R053 Chronic cough: Secondary | ICD-10-CM

## 2024-06-23 DIAGNOSIS — Z91148 Patient's other noncompliance with medication regimen for other reason: Secondary | ICD-10-CM

## 2024-06-23 DIAGNOSIS — E1165 Type 2 diabetes mellitus with hyperglycemia: Secondary | ICD-10-CM | POA: Diagnosis not present

## 2024-06-23 DIAGNOSIS — Z794 Long term (current) use of insulin: Secondary | ICD-10-CM

## 2024-06-23 DIAGNOSIS — R21 Rash and other nonspecific skin eruption: Secondary | ICD-10-CM

## 2024-06-23 LAB — POCT GLYCOSYLATED HEMOGLOBIN (HGB A1C): Hemoglobin A1C: 9.2 % — AB (ref 4.0–5.6)

## 2024-06-23 MED ORDER — TRIAMCINOLONE ACETONIDE 0.5 % EX CREA: 1.0000 | TOPICAL_CREAM | Freq: Two times a day (BID) | CUTANEOUS | 0 refills | Status: DC | PRN

## 2024-06-23 NOTE — Patient Instructions (Signed)
 Referring To Provider Information [facility] [address] [place] [address] [phone]   Referral Start Date: 06/09/2024 Referral End Date: 06/09/2025
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
## 2024-06-30 ENCOUNTER — Ambulatory Visit (INDEPENDENT_AMBULATORY_CARE_PROVIDER_SITE_OTHER): Admitting: Internal Medicine

## 2024-06-30 ENCOUNTER — Ambulatory Visit

## 2024-06-30 ENCOUNTER — Encounter: Payer: Self-pay | Admitting: Internal Medicine

## 2024-06-30 VITALS — BP 162/80 | HR 95 | Temp 97.9°F | Ht 75.0 in | Wt 232.0 lb

## 2024-06-30 DIAGNOSIS — R079 Chest pain, unspecified: Secondary | ICD-10-CM | POA: Diagnosis not present

## 2024-06-30 DIAGNOSIS — R058 Other specified cough: Secondary | ICD-10-CM

## 2024-06-30 DIAGNOSIS — R059 Cough, unspecified: Secondary | ICD-10-CM | POA: Diagnosis not present

## 2024-06-30 LAB — CBC WITH DIFFERENTIAL/PLATELET
Basophils Absolute: 0 K/uL (ref 0.0–0.1)
Basophils Relative: 0.6 % (ref 0.0–3.0)
Eosinophils Absolute: 0.2 K/uL (ref 0.0–0.7)
Eosinophils Relative: 3.6 % (ref 0.0–5.0)
HCT: 43 % (ref 39.0–52.0)
Hemoglobin: 14.1 g/dL (ref 13.0–17.0)
Lymphocytes Relative: 32.2 % (ref 12.0–46.0)
Lymphs Abs: 2.1 K/uL (ref 0.7–4.0)
MCHC: 32.8 g/dL (ref 30.0–36.0)
MCV: 92.3 fl (ref 78.0–100.0)
Monocytes Absolute: 0.7 K/uL (ref 0.1–1.0)
Monocytes Relative: 11.2 % (ref 3.0–12.0)
Neutro Abs: 3.5 K/uL (ref 1.4–7.7)
Neutrophils Relative %: 52.4 % (ref 43.0–77.0)
Platelets: 265 K/uL (ref 150.0–400.0)
RBC: 4.66 Mil/uL (ref 4.22–5.81)
RDW: 13.1 % (ref 11.5–15.5)
WBC: 6.6 K/uL (ref 4.0–10.5)

## 2024-06-30 MED ORDER — PREDNISONE 10 MG PO TABS
ORAL_TABLET | ORAL | 0 refills | Status: DC
Start: 2024-06-30 — End: 2024-08-24

## 2024-06-30 MED ORDER — TRAMADOL HCL 50 MG PO TABS
50.0000 mg | ORAL_TABLET | ORAL | 0 refills | Status: AC | PRN
Start: 2024-06-30 — End: 2024-07-05

## 2024-06-30 MED ORDER — FAMOTIDINE 20 MG PO TABS: ORAL_TABLET | ORAL | 11 refills | Status: AC

## 2024-06-30 NOTE — Patient Instructions (Addendum)
 Upper airway cough syndrome = Richard Honore [place]   The key to effective treatment for your cough is eliminating the non-stop cycle of cough you're stuck in long enough to let your airway heal completely and then see if there is anything still making you cough once you stop the cough suppression, but this should take no more than 5 days to figure out  First take delsym two tsp every 12 hours and supplement if needed with  tramadol  50 mg up to 2 every 4 hours to suppress the urge to cough at all or even clear your throat. Swallowing water or using ice chips/non mint and menthol containing candies (such as lifesavers or sugarless jolly ranchers) are also effective.  You should rest your voice and avoid activities that you know make you cough.  Once you have eliminated the cough for 3 straight days try reducing the tramadol  first,  then the delsym as tolerated.    Prednisone  10 mg take  4 each am x 2 days,   2 each am x 2 days,  1 each am x 2 days and stop (this is to eliminate allergies and inflammation from coughing)  Protonix (pantoprazole) Take 30-60 min before first meal of the day and Pepcid  20 mg one bedtime plus chlorpheniramine 4 mg x 2 at bedtime (both available over the counter)  until cough is completely gone for at least a week without the need for cough suppression  GERD (REFLUX)  is an extremely common cause of respiratory symptoms, many times with no significant heartburn at all.    It can be treated with medication, but also with lifestyle changes including avoidance of late meals, excessive alcohol, smoking cessation, and avoid fatty foods, chocolate, peppermint, colas, red wine, and acidic juices such as orange juice.  NO MINT OR MENTHOL PRODUCTS SO NO COUGH DROPS  USE HARD CANDY INSTEAD (jolley ranchers or Stover's or Lifesavers (all available in sugarless versions) NO OIL BASED VITAMINS - use powdered substitutes.  My office will be contacting you by phone for referral to  sinus CT  - if you don't hear back from my office within one week please call us  back or notify us  thru MyChart and we'll address it right away.   Please remember to go to the lab department   for your tests - we will call you with the results when they are available.     Please remember to go to the  x-ray department  for your tests - we will call you with the results when they are available

## 2024-06-30 NOTE — Assessment & Plan Note (Addendum)
 Onset Feb 2025 on a back ground of chronic rhinitis/ sinusitis  - Allergy screen 06/30/2024 >  Eos 0.2 /  IgE  pending  - Sinus CT 06/30/2024 >>>   Upper airway cough syndrome (previously labeled PNDS),  is so named because it's frequently impossible to sort out how much is  CR/sinusitis with freq throat clearing (which can be related to primary GERD)   vs  causing  secondary ( extra esophageal)  GERD from wide swings in gastric pressure that occur with throat clearing, often  promoting self use of mint and menthol lozenges that reduce the lower esophageal sphincter tone and exacerbate the problem further in a cyclical fashion.   These are the same pts (now being labeled as having irritable larynx syndrome by some cough centers) who not infrequently have a history of having failed to tolerate ace inhibitors,  dry powder inhalers or biphosphonates or report having atypical/extraesophageal reflux symptoms that don't respond to standard doses of PPI  and are easily confused as having aecopd or asthma flares by even experienced allergists/ pulmonologists (myself included).    Of the three most common causes of  Sub-acute / recurrent or chronic cough, only one (GERD)  can actually contribute to/ trigger  the other two (asthma and post nasal drip syndrome)  and perpetuate the cylce of cough.  While not intuitively obvious, many patients with chronic low grade reflux do not cough until there is a primary insult that disturbs the protective epithelial barrier and exposes sensitive nerve endings.   This is typically viral but can due to PNDS and  either may apply here.   The point is that once this occurs, it is difficult to eliminate the cycle  using anything but a maximally effective acid suppression regimen at least in the short run, accompanied by an appropriate diet to address non acid GERD and control pnds with 1st gen H1 blockers per guidelines  / eliminate the cough itself for at least 3 days with tramadol   and  also added 6 day taper off  Prednisone  starting at 40 mg per day in case of component of Th-2 driven upper or lower airways inflammation (if cough responds short term only to relapse before return while will on full rx for uacs (as above), then that would point to allergic rhinitis/ asthma or eos bronchitis as alternative dx)    F/u with ent if pos sinus ct/ f/u with allergy if pos allergy screen, but I strongly doubt there is a pulmonary source for this cough         Each maintenance medication was reviewed in detail including emphasizing most importantly the difference between maintenance and prns and under what circumstances the prns are to be triggered using an action plan format where appropriate.  Total time for H and P, chart review, counseling, r  and generating customized AVS unique to this office visit / same day charting = 60 min > 30 min for multiple  refractory respiratory  symptoms of uncertain etiology          Patient Instructions  Upper airway cough syndrome = Jamie Foster   The key to effective treatment for your cough is eliminating the non-stop cycle of cough you're stuck in long enough to let your airway heal completely and then see if there is anything still making you cough once you stop the cough suppression, but this should take no more than 5 days to figure out  First take delsym  two tsp every 12 hours and supplement if needed with  tramadol  50 mg up to 2 every 4 hours to suppress the urge to cough at all or even clear your throat. Swallowing water or using ice chips/non mint and menthol containing candies (such as lifesavers or sugarless jolly ranchers) are also effective.  You should rest your voice and avoid activities that you know make you cough.  Once you have eliminated the cough for 3 straight days try reducing the tramadol  first,  then the delsym as tolerated.    Prednisone  10 mg take  4 each am x 2 days,   2 each am x 2 days,  1 each am x 2  days and stop (this is to eliminate allergies and inflammation from coughing)  Protonix (pantoprazole) Take 30-60 min before first meal of the day and Pepcid  20 mg one bedtime plus chlorpheniramine 4 mg x 2 at bedtime (both available over the counter)  until cough is completely gone for at least a week without the need for cough suppression  GERD (REFLUX)  is an extremely common cause of respiratory symptoms, many times with no significant heartburn at all.    It can be treated with medication, but also with lifestyle changes including avoidance of late meals, excessive alcohol, smoking cessation, and avoid fatty foods, chocolate, peppermint, colas, red wine, and acidic juices such as orange juice.  NO MINT OR MENTHOL PRODUCTS SO NO COUGH DROPS  USE HARD CANDY INSTEAD (jolley ranchers or Stover's or Lifesavers (all available in sugarless versions) NO OIL BASED VITAMINS - use powdered substitutes.  My office will be contacting you by phone for referral to sinus CT  - if you don't hear back from my office within one week please call us  back or notify us  thru MyChart and we'll address it right away.   Please remember to go to the lab department   for your tests - we will call you with the results when they are available.     Please remember to go to the  x-ray department  for your tests - we will call you with the results when they are available

## 2024-06-30 NOTE — Progress Notes (Addendum)
 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  developed problems with sinuses in [place] requiring surgery and now referred to pulmonary clinic 06/30/2024 by Dr Colette  for cough   While in [place] in 2001 [place] difficulty breathing thru nose and did surgery no benefit then moved to [facility] in 2006 with allergy w/u by Ladora First  neg rx flonase no better self rx with vicks vapor rub prn x Fe 2025   [place] Feb [place]  swampy and started coughing w/in a month    History of Present Illness  06/30/2024  Pulmonary/ 1st office eval/Peightyn Roberson no response to pred/ abx  Chief Complaint  Patient presents with   Consult    Severe cough for at least 4 months. Was previously coughing up white, yellow, and black phlegm. Is no longer coughing anything up.    Dyspnea:  short of breath even if not coughing but most of the sob is with cough Cough: non-productive / worse with speaking or laughing  Sleep: bed is flat/ one pillow/ tends to cough thru the night SABA use: not helping  L sided rib pain since onset of cough and made worse now by coughing     No obvious day to day or daytime pattern/variability or assoc excess/ purulent sputum or mucus plugs or hemoptysis or  chest tightness, subjective wheeze or overt sinus or hb symptoms.    Also denies any obvious fluctuation of symptoms with weather or environmental changes or other aggravating or alleviating factors except as outlined above   No unusual exposure hx or h/o childhood pna/ asthma or knowledge of premature birth.  Current Allergies, Complete Past Medical History, Past Surgical History, Family History, and Social History were reviewed in [employer] record.  ROS  The following are not active complaints unless bolded Hoarseness, sore throat= globus , dysphagia, dental problems, itching, sneezing,  nasal congestion or  discharge of excess mucus or purulent secretions, ear ache,   fever, chills, sweats, unintended wt loss or wt gain, classically  exertional cp,  orthopnea pnd or arm/hand swelling  or leg swelling, presyncope, palpitations, abdominal pain, anorexia, nausea, vomiting, diarrhea  or change in bowel habits or change in bladder habits, change in stools or change in urine, dysuria, hematuria,  rash, arthralgias, visual complaints, headache, numbness, weakness or ataxia or problems with walking or coordination,  change in mood or  memory.             Outpatient Medications Prior to Visit  Medication Sig Dispense Refill   albuterol  (VENTOLIN  HFA) 108 (90 Base) MCG/ACT inhaler Inhale 2 puffs into the lungs every 6 (six) hours as needed for wheezing or shortness of breath. 6.7 g 0   amoxicillin -clavulanate (AUGMENTIN ) 875-125 MG tablet Take 1 tablet by mouth every 12 (twelve) hours. 14 tablet 0   benzonatate  (TESSALON ) 200 MG capsule Take 1 capsule (200 mg total) by mouth 3 (three) times daily as needed for cough. 30 capsule 0   Continuous Glucose Sensor (DEXCOM G7 SENSOR) MISC Apply 1 sensor to skin as directed then change every 10 days 9 each 1   Continuous Glucose Sensor (FREESTYLE LIBRE 2 SENSOR) MISC Apply 1 sensor once every 14 days 7 each 4   Continuous Glucose Sensor (FREESTYLE LIBRE 3 PLUS SENSOR) MISC Change sensor every 14 (fourteen) days. 2 each  1   Continuous Glucose Sensor (FREESTYLE LIBRE 3 PLUS SENSOR) MISC Apply 1 sensor as directed to check blood sugar and change every 15 (fifteen) days. 6 each 4   Continuous Glucose Sensor (FREESTYLE LIBRE 3 PLUS SENSOR) MISC Apply 1 sensor as directed to check blood sugar and change every 15 (fifteen) days. 6 each 5   dapagliflozin  propanediol (FARXIGA ) 10 MG TABS tablet Take 1 tablet by mouth every morning 90 tablet 1   doxycycline  (VIBRAMYCIN ) 100 MG capsule Take 1 capsule (100 mg total) by mouth 2 (two) times daily. 20 capsule 0   famotidine  (PEPCID )  20 MG tablet Take 1 tablet (20 mg total) by mouth 2 (two) times daily. 30 tablet 2   fexofenadine  (ALLEGRA  ALLERGY) 180 MG tablet Take 1 tablet (180 mg total) by mouth daily for 15 days. 15 tablet 0   glucose blood (TRUETRACK TEST) test strip Use 2x a day 100 each 5   GLUCOTROL  XL 5 MG 24 hr tablet Take 1 tablet (5 mg total) by mouth 2 (two) times daily. 180 tablet 1   HYDROcodone  bit-homatropine (HYCODAN) 5-1.5 MG/5ML syrup Take 5 mLs by mouth every 6 (six) hours as needed for cough. 120 mL 0   ibuprofen  (ADVIL ) 600 MG tablet Take 1 tablet (600 mg total) by mouth every 6 (six) hours as needed. 30 tablet 0   insulin  glargine (LANTUS  SOLOSTAR) 100 UNIT/ML Solostar Pen Inject 18 Units into the skin at bedtime. 15 mL 11   Insulin  Pen Needle 32G X 4 MM MISC Use as directed 1 time a day. 100 each 3   Insulin  Pen Needle 32G X 4 MM MISC Use as directed 1 time a day. 100 each 0   ketoconazole  (NIZORAL ) 2 % cream Apply liberally to affected area once a day. 90 g 0   losartan  (COZAAR ) 100 MG tablet Take 1 tablet (100 mg total) by mouth daily. 90 tablet 0   montelukast  (SINGULAIR ) 10 MG tablet Take 1 tablet (10 mg total) by mouth at bedtime. 90 tablet 1   Olopatadine  HCl 0.2 % SOLN Apply 1 drop to eye daily as needed. 2.5 mL 1   omeprazole  (PRILOSEC) 40 MG capsule Take 1 capsule (40 mg total) by mouth 2 (two) times daily. 180 capsule 1   ONETOUCH DELICA LANCETS 33G MISC Use to test blood sugar 2 times daily as instructed. 100 each 11   rosuvastatin  (CRESTOR ) 10 MG tablet Take 1 tablet (10 mg total) by mouth daily. 90 tablet 1   Semaglutide , 1 MG/DOSE, (OZEMPIC , 1 MG/DOSE,) 4 MG/3ML SOPN Inject 1 mg into the skin once a week. 9 mL 0   triamcinolone  (NASACORT ) 55 MCG/ACT AERO nasal inhaler Place 2 squirts in each nostril at night before bedtime 33.8 mL 2   triamcinolone  cream (KENALOG ) 0.5 % Apply 1 Application topically 2 (two) times daily as needed. 30 g 0   [place] LANCETS MISC Use 2x a day - Tru Track  200 each 5   No facility-administered medications prior to visit.    Past Medical History:  Diagnosis Date   Allergy    Diabetes mellitus without complication (HCC)    GERD (gastroesophageal reflux disease)    H pylori ulcer    Hyperlipidemia    Hypertension    Kidney stone    Sleep apnea       Objective:     BP (!) 162/80 Comment: Retook BP, last reading was elevated.  Pulse 95   Temp 97.9 F (36.6  C)   Ht 6' 3 (1.905 m)   Wt 232 lb (105.2 kg)   SpO2 99% Comment: RA  BMI 29.00 kg/m   SpO2: 99 % (RA)  pleasant amb  pleasant bm strong accent hard to understand some of his hx   HEENT : Oropharynx  clear      Nasal turbinates min non-specific swelling/ no polyps    NECK :  without  apparent JVD/ palpable Nodes/TM    LUNGS: no acc muscle use,  Nl contour chest which is clear to A and P bilaterally without cough on insp or exp maneuvers   CV:  RRR  no s3 or murmur or increase in P2, and no edema   ABD:  soft and nontender   MS:  Gait nl   ext warm without deformities Or obvious joint restrictions  calf tenderness, cyanosis or clubbing    SKIN: warm and dry without lesions    NEURO:  alert, approp, nl sensorium with  no motor or cerebellar deficits apparent.     CXR PA and Lateral:   06/30/2024 :    I personally reviewed images and impression is as follows:     Lots of intestinal gas LUQ without effusion / ptx/or obvious rib fx      Assessment   Assessment & Plan Upper airway cough syndrome Onset Feb 2025 on a back ground of chronic rhinitis/ sinusitis  - Allergy screen 06/30/2024 >  Eos 0.2 /  IgE  pending  - Sinus CT 06/30/2024 >>>   Upper airway cough syndrome (previously labeled PNDS),  is so named because it's frequently impossible to sort out how much is  CR/sinusitis with freq throat clearing (which can be related to primary GERD)   vs  causing  secondary ( extra esophageal)  GERD from wide swings in gastric pressure that occur with throat clearing,  often  promoting self use of mint and menthol lozenges that reduce the lower esophageal sphincter tone and exacerbate the problem further in a cyclical fashion.   These are the same pts (now being labeled as having irritable larynx syndrome by some cough centers) who not infrequently have a history of having failed to tolerate ace inhibitors,  dry powder inhalers or biphosphonates or report having atypical/extraesophageal reflux symptoms that don't respond to standard doses of PPI  and are easily confused as having aecopd or asthma flares by even experienced allergists/ pulmonologists (myself included).    Of the three most common causes of  Sub-acute / recurrent or chronic cough, only one (GERD)  can actually contribute to/ trigger  the other two (asthma and post nasal drip syndrome)  and perpetuate the cylce of cough.  While not intuitively obvious, many patients with chronic low grade reflux do not cough until there is a primary insult that disturbs the protective epithelial barrier and exposes sensitive nerve endings.   This is typically viral but can due to PNDS and  either may apply here.   The point is that once this occurs, it is difficult to eliminate the cycle  using anything but a maximally effective acid suppression regimen at least in the short run, accompanied by an appropriate diet to address non acid GERD and control pnds with 1st gen H1 blockers per guidelines  / eliminate the cough itself for at least 3 days with tramadol  and  also added 6 day taper off  Prednisone  starting at 40 mg per day in case of component of Th-2 driven upper or  lower airways inflammation (if cough responds short term only to relapse before return while will on full rx for uacs (as above), then that would point to allergic rhinitis/ asthma or eos bronchitis as alternative dx)    F/u with ent if pos sinus ct/ f/u with allergy if pos allergy screen, but I strongly doubt there is a pulmonary source for this cough          Each maintenance medication was reviewed in detail including emphasizing most importantly the difference between maintenance and prns and under what circumstances the prns are to be triggered using an action plan format where appropriate.  Total time for H and P, chart review, counseling, r  and generating customized AVS unique to this office visit / same day charting = 60 min > 30 min for multiple  refractory respiratory  symptoms of uncertain etiology          Patient Instructions  Upper airway cough syndrome = Richard Honore [place]   The key to effective treatment for your cough is eliminating the non-stop cycle of cough you're stuck in long enough to let your airway heal completely and then see if there is anything still making you cough once you stop the cough suppression, but this should take no more than 5 days to figure out  First take delsym two tsp every 12 hours and supplement if needed with  tramadol  50 mg up to 2 every 4 hours to suppress the urge to cough at all or even clear your throat. Swallowing water or using ice chips/non mint and menthol containing candies (such as lifesavers or sugarless jolly ranchers) are also effective.  You should rest your voice and avoid activities that you know make you cough.  Once you have eliminated the cough for 3 straight days try reducing the tramadol  first,  then the delsym as tolerated.    Prednisone  10 mg take  4 each am x 2 days,   2 each am x 2 days,  1 each am x 2 days and stop (this is to eliminate allergies and inflammation from coughing)  Protonix (pantoprazole) Take 30-60 min before first meal of the day and Pepcid  20 mg one bedtime plus chlorpheniramine 4 mg x 2 at bedtime (both available over the counter)  until cough is completely gone for at least a week without the need for cough suppression  GERD (REFLUX)  is an extremely common cause of respiratory symptoms, many times with no significant heartburn at all.    It can  be treated with medication, but also with lifestyle changes including avoidance of late meals, excessive alcohol, smoking cessation, and avoid fatty foods, chocolate, peppermint, colas, red wine, and acidic juices such as orange juice.  NO MINT OR MENTHOL PRODUCTS SO NO COUGH DROPS  USE HARD CANDY INSTEAD (jolley ranchers or Stover's or Lifesavers (all available in sugarless versions) NO OIL BASED VITAMINS - use powdered substitutes.  My office will be contacting you by phone for referral to sinus CT  - if you don't hear back from my office within one week please call us  back or notify us  thru MyChart and we'll address it right away.   Please remember to go to the lab department   for your tests - we will call you with the results when they are available.     Please remember to go to the  x-ray department  for your tests - we will call you with the results when  they are available      Ozell America, MD 06/30/2024

## 2024-07-01 ENCOUNTER — Telehealth: Payer: Self-pay | Admitting: Gastroenterology

## 2024-07-01 ENCOUNTER — Encounter: Admitting: Gastroenterology

## 2024-07-01 LAB — IGE: IgE (Immunoglobulin E), Serum: 33 kU/L (ref <=114)

## 2024-07-01 NOTE — Telephone Encounter (Signed)
 Good Morning Dr. San,  I called this patient at 9:18 am today and he stated he was not feeling well, went to the doctor yesterday and received some medication.  He took them and he fell asleep just awoke alittle while ago  and was felling dizzy so he ate.   He will reschedule.    I will NO SHOW him.  [place]

## 2024-07-01 NOTE — Telephone Encounter (Signed)
 Good morning Dr. San,   Patient called stated he would not be coming in today for 9:00 am EGD. States he went to the doctor yesterday and was prescribed Tramadol  medication which made him very drowsy this morning and he was not able to get up. Patient has been rescheduled for 9/4. Please advise.  Thank you

## 2024-07-02 ENCOUNTER — Ambulatory Visit: Payer: Self-pay | Admitting: Internal Medicine

## 2024-07-06 ENCOUNTER — Telehealth: Payer: Self-pay | Admitting: *Deleted

## 2024-07-06 NOTE — Telephone Encounter (Signed)
 Copied from CRM [id number]. Topic: Clinical - Lab/Test Results >> Jul 04, 2024  3:05 PM Celestine FALCON wrote: Reason for CRM: Pt is calling back after missing a call. I attempted to call CAL to get Margie, but CAL did not answer 2x.   Pt was contacted by CMA Lebron Sharps in regards to CBC with Differential/Platelet, IgE results.  Please call the pt back at [phone] ok to leave a vm.  I called the pt and there was no answer- left detailed msg on his machine with normal results.

## 2024-07-06 NOTE — Progress Notes (Signed)
 Left detailed msg on machine with results ok per pt.

## 2024-07-07 NOTE — Progress Notes (Signed)
 Called and spoke to pt, informed him of advice per Dr. Darlean:  Only wanted to use the tramadol  acutely x 5 days - if the cough came back that indicates more work up needed and may need to consider a longer activing med like gabapentin trial so needs f/u ov for this with all active meds in hand.  Pt verbalized understanding and has an appt set for 08/24/2024 @ 9:45am. NFN.

## 2024-07-07 NOTE — Progress Notes (Signed)
 Called and spoke to Jamie Foster, gave him xray results per Dr. Darlean. Jamie Foster expressed understanding.  Dr. Darlean - Jamie Foster states he would like a refill of the tramadol . He states his cough is still bothersome despite trying to reduce the tramadol . Please advise, thank you.

## 2024-07-11 ENCOUNTER — Ambulatory Visit (HOSPITAL_BASED_OUTPATIENT_CLINIC_OR_DEPARTMENT_OTHER)
Admission: RE | Admit: 2024-07-11 | Discharge: 2024-07-11 | Disposition: A | Discharge status: Home / Self Care | Source: Ambulatory Visit | Attending: Internal Medicine | Admitting: Internal Medicine

## 2024-07-11 DIAGNOSIS — R058 Other specified cough: Secondary | ICD-10-CM | POA: Insufficient documentation

## 2024-07-11 DIAGNOSIS — R059 Cough, unspecified: Secondary | ICD-10-CM | POA: Diagnosis not present

## 2024-07-18 ENCOUNTER — Ambulatory Visit: Payer: Self-pay

## 2024-07-18 ENCOUNTER — Encounter: Payer: Self-pay | Admitting: Family Medicine

## 2024-07-18 DIAGNOSIS — R053 Chronic cough: Secondary | ICD-10-CM

## 2024-07-18 NOTE — Telephone Encounter (Signed)
 FYI Only or Action Required?: FYI only for provider.  Patient was last seen in primary care on 06/23/2024 by Colette Torrence GRADE, MD.  Called Nurse Triage reporting Cough.  Symptoms began several weeks ago.  Interventions attempted: Rest, hydration, or home remedies.  Symptoms are: gradually worsening.  Triage Disposition: Go to ED Now (or PCP Triage)  Patient/caregiver understands and will follow disposition?: Yes  Copied from CRM #[id number]. Topic: Clinical - Red Word Triage >> Jul 18, 2024  4:16 PM Isabell A wrote: Red Word that prompted transfer to Nurse Triage: Patient is calling to speak with Dr.Wert - states there hasnt been must change in his lungs. Experiencing headache, pain everytime he cough, states he's been bedridden since Thursday. Reason for Disposition  Patient sounds very sick or weak to the triager  Answer Assessment - Initial Assessment Questions 1. ONSET: When did the cough begin?      Been going on for several months 2. SEVERITY: How bad is the cough today?      severe 3. SPUTUM: Describe the color of your sputum (e.g., none, dry cough; clear, white, yellow, green)     nonproductive 4. HEMOPTYSIS: Are you coughing up any blood? If so ask: How much? (e.g., flecks, streaks, tablespoons, etc.)     no 5. DIFFICULTY BREATHING: Are you having difficulty breathing? If Yes, ask: How bad is it? (e.g., mild, moderate, severe)      Shortness of breath at times 6. FEVER: Do you have a fever? If Yes, ask: What is your temperature, how was it measured, and when did it start?     no 7. CARDIAC HISTORY: Do you have any history of heart disease? (e.g., heart attack, congestive heart failure)      no 8. LUNG HISTORY: Do you have any history of lung disease?  (e.g., pulmonary embolus, asthma, emphysema)     Chronic cough 9. PE RISK FACTORS: Do you have a history of blood clots? (or: recent major surgery, recent prolonged travel, bedridden)     no 10.  OTHER SYMPTOMS: Do you have any other symptoms? (e.g., runny nose, wheezing, chest pain)       headache 12. TRAVEL: Have you traveled out of the country in the last month? (e.g., travel history, exposures)       No  Patient states he has been bedridden since Thursday with headache, pain with coughing and not feeling well. Patient is recommended to the ED for evaluation.  Protocols used: Cough - Chronic-A-AH

## 2024-07-21 NOTE — Telephone Encounter (Signed)
 Routing back to Dr Darlean per Dr Darlean.

## 2024-07-21 NOTE — Progress Notes (Signed)
 Atc and relay results, pt answers but said he was otp with the mortgage company and said he would call back.

## 2024-07-28 ENCOUNTER — Encounter: Admitting: Gastroenterology

## 2024-07-28 NOTE — Telephone Encounter (Signed)
 Hi Dr San,  I called patient regarding his procedure, he states he was wait on a reminder from us , which he did not get. Patient states he will call back to reschedule. I will no show him for today's procedure.   Thank you

## 2024-08-22 NOTE — Progress Notes (Unsigned)
 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  developed problems with sinuses in [place] requiring surgery and now referred to pulmonary clinic 06/30/2024 by Dr Colette  for cough   While in [place] in 2001 [place] difficulty breathing thru nose and did surgery no benefit then moved to [facility] in 2006 with allergy w/u by Ladora First  neg rx flonase no better self rx with vicks vapor rub prn x Fe 2025   [place] Feb 2025  swampy and started coughing w/in a month    History of Present Illness  06/30/2024  Pulmonary/ 1st office eval/Jamie Foster no response to pred/ abx  Chief Complaint  Patient presents with   Consult    Severe cough for at least 4 months. Was previously coughing up white, yellow, and black phlegm. Is no longer coughing anything up.    Dyspnea:  short of breath even if not coughing but most of the sob is with cough Cough: non-productive / worse with speaking or laughing  Sleep: bed is flat/ one pillow/ tends to cough thru the night SABA use: not helping  L sided rib pain since onset of cough and made worse now by coughing Rec   08/24/2024  f/u ov/Jamie Foster re: UACS/ splenic flex syndrome   maint on ***  No chief complaint on file.   Dyspnea:  *** Cough: *** Sleeping: *** resp cc  SABA use: *** 02: ***  Lung cancer screening :  ***    No obvious day to day or daytime variability or assoc excess/ purulent sputum or mucus plugs or hemoptysis or cp or chest tightness, subjective wheeze or overt sinus or hb symptoms.    Also denies any obvious fluctuation of symptoms with weather or environmental changes or other aggravating or alleviating factors except as outlined above   No unusual exposure hx or h/o childhood pna/ asthma or knowledge of premature birth.  Current Allergies, Complete Past Medical History, Past Surgical History, Family History, and Social History were  reviewed in [employer] record.  ROS  The following are not active complaints unless bolded Hoarseness, sore throat, dysphagia, dental problems, itching, sneezing,  nasal congestion or discharge of excess mucus or purulent secretions, ear ache,   fever, chills, sweats, unintended wt loss or wt gain, classically pleuritic or exertional cp,  orthopnea pnd or arm/hand swelling  or leg swelling, presyncope, palpitations, abdominal pain, anorexia, nausea, vomiting, diarrhea  or change in bowel habits or change in bladder habits, change in stools or change in urine, dysuria, hematuria,  rash, arthralgias, visual complaints, headache, numbness, weakness or ataxia or problems with walking or coordination,  change in mood or  memory.        No outpatient medications have been marked as taking for the 08/24/24 encounter (Appointment) with Jamie Ozell NOVAK, MD.             Past Medical History:  Diagnosis Date   Allergy    Diabetes mellitus without complication (HCC)    GERD (gastroesophageal reflux disease)    H pylori ulcer    Hyperlipidemia    Hypertension    Kidney stone    Sleep apnea       Objective:      Wt Readings from Last 3 Encounters:  06/30/24 232 lb (105.2 kg)  06/23/24 230 lb 3.2 oz (  104.4 kg)  06/17/24 235 lb (106.6 kg)      Vital signs reviewed  08/24/2024  - Note at rest 02 sats  ***% on ***   General appearance:    ***       Assessment

## 2024-08-24 ENCOUNTER — Other Ambulatory Visit: Payer: Self-pay

## 2024-08-24 ENCOUNTER — Other Ambulatory Visit

## 2024-08-24 ENCOUNTER — Encounter: Payer: Self-pay | Admitting: Internal Medicine

## 2024-08-24 ENCOUNTER — Other Ambulatory Visit (HOSPITAL_BASED_OUTPATIENT_CLINIC_OR_DEPARTMENT_OTHER): Payer: Self-pay

## 2024-08-24 ENCOUNTER — Ambulatory Visit (INDEPENDENT_AMBULATORY_CARE_PROVIDER_SITE_OTHER): Admitting: Internal Medicine

## 2024-08-24 VITALS — BP 180/104 | HR 95 | Ht 75.0 in | Wt 226.0 lb

## 2024-08-24 DIAGNOSIS — I10 Essential (primary) hypertension: Secondary | ICD-10-CM | POA: Diagnosis not present

## 2024-08-24 DIAGNOSIS — R058 Other specified cough: Secondary | ICD-10-CM | POA: Diagnosis not present

## 2024-08-24 LAB — CBC WITH DIFFERENTIAL/PLATELET
Basophils Absolute: 0.1 K/uL (ref 0.0–0.1)
Basophils Relative: 1 % (ref 0.0–3.0)
Eosinophils Absolute: 0.2 K/uL (ref 0.0–0.7)
Eosinophils Relative: 3.3 % (ref 0.0–5.0)
HCT: 40.4 % (ref 39.0–52.0)
Hemoglobin: 13.2 g/dL (ref 13.0–17.0)
Lymphocytes Relative: 28.2 % (ref 12.0–46.0)
Lymphs Abs: 1.7 K/uL (ref 0.7–4.0)
MCHC: 32.7 g/dL (ref 30.0–36.0)
MCV: 93.1 fl (ref 78.0–100.0)
Monocytes Absolute: 0.6 K/uL (ref 0.1–1.0)
Monocytes Relative: 9.8 % (ref 3.0–12.0)
Neutro Abs: 3.4 K/uL (ref 1.4–7.7)
Neutrophils Relative %: 57.7 % (ref 43.0–77.0)
Platelets: 251 K/uL (ref 150.0–400.0)
RBC: 4.34 Mil/uL (ref 4.22–5.81)
RDW: 14 % (ref 11.5–15.5)
WBC: 5.9 K/uL (ref 4.0–10.5)

## 2024-08-24 MED ORDER — VALSARTAN 160 MG PO TABS
160.0000 mg | ORAL_TABLET | Freq: Every day | ORAL | 11 refills | Status: AC
  Filled 2024-08-24 (×2): qty 30, 30d supply, fill #0
  Filled 2024-09-06 – 2024-09-21 (×2): qty 30, 30d supply, fill #1
  Filled 2024-09-26: qty 30, 30d supply, fill #2
  Filled 2024-10-25: qty 90, 90d supply, fill #2

## 2024-08-24 NOTE — Patient Instructions (Signed)
 My office will be contacting you by phone for referral to ENT  - if you don't hear back from my office within one week please call us  back or notify us  thru MyChart and we'll address it right away.   Start valsartan 160 mg one daily

## 2024-08-25 LAB — BASIC METABOLIC PANEL WITH GFR
BUN: 14 mg/dL (ref 6–23)
CO2: 32 meq/L (ref 19–32)
Calcium: 9.7 mg/dL (ref 8.4–10.5)
Chloride: 100 meq/L (ref 96–112)
Creatinine, Ser: 1.21 mg/dL (ref 0.40–1.50)
GFR: 65.33 mL/min (ref >60.00)
Glucose, Bld: 224 mg/dL — ABNORMAL HIGH (ref 70–99)
Potassium: 4.3 meq/L (ref 3.5–5.1)
Sodium: 137 meq/L (ref 135–145)

## 2024-08-25 LAB — HEPATIC FUNCTION PANEL
ALT: 27 U/L (ref 0–53)
AST: 23 U/L (ref 0–37)
Albumin: 4.2 g/dL (ref 3.5–5.2)
Alkaline Phosphatase: 50 U/L (ref 39–117)
Bilirubin, Direct: 0.1 mg/dL (ref 0.0–0.3)
Total Bilirubin: 0.5 mg/dL (ref 0.2–1.2)
Total Protein: 7.1 g/dL (ref 6.0–8.3)

## 2024-08-25 NOTE — Assessment & Plan Note (Addendum)
 Start valsartan 160 due to intol to losartan  (cns) 08/24/2024 >>>  Rx valsatan 160 mg daily / check bmet   Discussed in detail all the  indications, usual  risks and alternatives  relative to the benefits with patient who agrees to proceed with Rx as outlined.             Each maintenance medication was reviewed in detail including emphasizing most importantly the difference between maintenance and prns and under what circumstances the prns are to be triggered using an action plan format where appropriate.  Total time for H and P, chart review, counseling,   and generating customized AVS unique to this office visit / same day charting = 35 min summary f/u ov  for  refractory respiratory  symptoms of uncertain etiology

## 2024-08-25 NOTE — Assessment & Plan Note (Addendum)
 Onset Feb 2025 on a back ground of chronic rhinitis/ sinusitis  - Allergy screen 06/30/2024 >  Eos 0.2 /  IgE  33 - Sinus CT 07/11/24  Prior endoscopic sinus surgery with moderate right and mild leftmaxillary sinus mucosal thickening, potentially in part odontogenic given periapical lucency of the posterior-most right mandibular molar > refer to ENT >referred to ENT (2nd request)   Pt advised: The standardized cough guidelines published in Chest by Charlie Coder in 2006 are still the best available and consist of a multiple step process (up to 12!) , not a single office visit,  and are intended  to address this problem logically,  with an alogrithm dependent on response to empiric treatment at  each progressive step  to determine a specific diagnosis with  minimal addtional testing needed. Therefore if adherence is an issue or can't be accurately verified,  it's very unlikely the standard evaluation and treatment will be successful here.    Furthermore, response to therapy (other than acute cough suppression, which should only be used short term with avoidance of narcotic containing cough syrups if possible), can be a gradual process for which the patient is not likely to  perceive immediate benefit.  Unlike going to an eye doctor where the best perscription is almost always the first one and is immediately effective, this is almost never the case in the management of chronic cough syndromes. Therefore the patient needs to commit up front to consistently adhere to recommendations  for up to 6 weeks of therapy directed at the likely underlying problem(s) before the response can be reasonably evaluated.      >>>Can return and start over if not satisfied with ENT rx and willing to stay on meds between visits, return with all meds in hand using a trust but verify approach to confirm accurate Medication  Reconciliation The principal here is that until we are certain that the  patients are doing what we've asked,  it makes no sense to ask them to do more.

## 2024-08-26 ENCOUNTER — Ambulatory Visit: Payer: Self-pay | Admitting: Internal Medicine

## 2024-08-29 NOTE — Progress Notes (Signed)
 Called pt and advised of results per Dr. Darlean - I could not understand pt as phone was cutting out. Will also send a [employer].

## 2024-09-06 ENCOUNTER — Other Ambulatory Visit: Payer: Self-pay | Admitting: Internal Medicine

## 2024-09-06 ENCOUNTER — Other Ambulatory Visit: Payer: Self-pay | Admitting: Family Medicine

## 2024-09-06 ENCOUNTER — Other Ambulatory Visit (HOSPITAL_BASED_OUTPATIENT_CLINIC_OR_DEPARTMENT_OTHER): Payer: Self-pay

## 2024-09-06 ENCOUNTER — Ambulatory Visit: Payer: Self-pay

## 2024-09-06 DIAGNOSIS — I10 Essential (primary) hypertension: Secondary | ICD-10-CM

## 2024-09-06 DIAGNOSIS — K219 Gastro-esophageal reflux disease without esophagitis: Secondary | ICD-10-CM

## 2024-09-06 DIAGNOSIS — E1165 Type 2 diabetes mellitus with hyperglycemia: Secondary | ICD-10-CM

## 2024-09-06 DIAGNOSIS — E782 Mixed hyperlipidemia: Secondary | ICD-10-CM

## 2024-09-06 DIAGNOSIS — J309 Allergic rhinitis, unspecified: Secondary | ICD-10-CM

## 2024-09-06 NOTE — Telephone Encounter (Signed)
 FYI Only or Action Required?: FYI only for provider.  Patient was last seen in primary care on 06/23/2024 by Colette Torrence GRADE, MD.  Called Nurse Triage reporting Chest Pain.  Symptoms began about a month ago.  Interventions attempted: Rest, hydration, or home remedies.  Symptoms are: gradually worsening.  Triage Disposition: See PCP When Office is Open (Within 3 Days)  Patient/caregiver understands and will follow disposition?: Yes  Copied from CRM #[id number]. Topic: Clinical - Red Word Triage >> Sep 06, 2024  3:12 PM Rachelle R wrote: [facility] that prompted transfer to Nurse Triage: Patient states he is having pain in his ribs, worse when he coughs and sneezes for the last two months and has progressively gotten worse. Reason for Disposition  [1] Chest pain(s) lasting a few seconds AND [2] persists > 3 days  Answer Assessment - Initial Assessment Questions Pt states for the last two months the front of his ribs hurt when he coughs and or sneezes. States the pain radiates to his stomach when it happens. Also denies any other sxs. Denies sob. NAD at this time  1. LOCATION: Where does it hurt?       Front ribs 2. RADIATION: Does the pain go anywhere else? (e.g., into neck, jaw, arms, back)     stomach 3. ONSET: When did the chest pain begin? (Minutes, hours or days)      Two months 4. PATTERN: Does the pain come and go, or has it been constant since it started?  Does it get worse with exertion?      When coughing sneezing 5. DURATION: How long does it last (e.g., seconds, minutes, hours)     Only when coughing or sneezing 6. SEVERITY: How bad is the pain?  (e.g., Scale 1-10; mild, moderate, or severe)     8/10 7. CARDIAC RISK FACTORS: Do you have any history of heart problems or risk factors for heart disease? (e.g., angina, prior heart attack; diabetes, high blood pressure, high cholesterol, smoker, or strong family history of heart disease)     denies 8. PULMONARY  RISK FACTORS: Do you have any history of lung disease?  (e.g., blood clots in lung, asthma, emphysema, birth control pills)     denies 9. CAUSE: What do you think is causing the chest pain?     unsure 10. OTHER SYMPTOMS: Do you have any other symptoms? (e.g., dizziness, nausea, vomiting, sweating, fever, difficulty breathing, cough)       denies  Protocols used: Chest Pain-A-AH

## 2024-09-06 NOTE — Telephone Encounter (Signed)
 The patient is scheduled to see Jamie Foster on 09/07/24 for the following symptoms.

## 2024-09-07 ENCOUNTER — Other Ambulatory Visit: Payer: Self-pay

## 2024-09-07 ENCOUNTER — Ambulatory Visit: Admitting: Physician Assistant

## 2024-09-07 ENCOUNTER — Other Ambulatory Visit (HOSPITAL_BASED_OUTPATIENT_CLINIC_OR_DEPARTMENT_OTHER): Payer: Self-pay

## 2024-09-07 ENCOUNTER — Other Ambulatory Visit: Payer: Self-pay | Admitting: Family Medicine

## 2024-09-07 VITALS — BP 149/91 | HR 82 | Ht 75.0 in | Wt 225.0 lb

## 2024-09-07 DIAGNOSIS — E1165 Type 2 diabetes mellitus with hyperglycemia: Secondary | ICD-10-CM | POA: Diagnosis not present

## 2024-09-07 DIAGNOSIS — Z7984 Long term (current) use of oral hypoglycemic drugs: Secondary | ICD-10-CM | POA: Diagnosis not present

## 2024-09-07 DIAGNOSIS — R21 Rash and other nonspecific skin eruption: Secondary | ICD-10-CM

## 2024-09-07 DIAGNOSIS — R1013 Epigastric pain: Secondary | ICD-10-CM | POA: Diagnosis not present

## 2024-09-07 DIAGNOSIS — K2100 Gastro-esophageal reflux disease with esophagitis, without bleeding: Secondary | ICD-10-CM | POA: Diagnosis not present

## 2024-09-07 DIAGNOSIS — G8929 Other chronic pain: Secondary | ICD-10-CM

## 2024-09-07 DIAGNOSIS — M25511 Pain in right shoulder: Secondary | ICD-10-CM | POA: Diagnosis not present

## 2024-09-07 DIAGNOSIS — M542 Cervicalgia: Secondary | ICD-10-CM | POA: Diagnosis not present

## 2024-09-07 MED ORDER — HYOSCYAMINE SULFATE 0.125 MG PO TBDP
0.1250 mg | ORAL_TABLET | Freq: Once | ORAL | Status: AC
  Administered 2024-09-07: 0.125 mg via SUBLINGUAL

## 2024-09-07 MED ORDER — ALUM & MAG HYDROXIDE-SIMETH 400-400-40 MG/5ML PO SUSP
30.0000 mL | Freq: Once | ORAL | Status: AC
  Administered 2024-09-07: 30 mL via ORAL

## 2024-09-07 MED ORDER — LIDOCAINE VISCOUS HCL 2 % MT SOLN
15.0000 mL | Freq: Once | OROMUCOSAL | Status: AC
  Administered 2024-09-07: 15 mL via OROMUCOSAL

## 2024-09-07 MED ORDER — KETOCONAZOLE 2 % EX CREA
1.0000 | TOPICAL_CREAM | Freq: Every day | CUTANEOUS | 0 refills | Status: DC
  Filled 2024-09-07: qty 90, 90d supply, fill #0

## 2024-09-07 MED ORDER — PANTOPRAZOLE SODIUM 40 MG PO TBEC
40.0000 mg | DELAYED_RELEASE_TABLET | Freq: Two times a day (BID) | ORAL | 1 refills | Status: AC
  Filled 2024-09-07 (×2): qty 180, 90d supply, fill #0

## 2024-09-07 MED ORDER — SEMAGLUTIDE (2 MG/DOSE) 8 MG/3ML ~~LOC~~ SOPN
2.0000 mg | PEN_INJECTOR | SUBCUTANEOUS | 0 refills | Status: DC
  Filled 2024-09-07: qty 3, 28d supply, fill #0
  Filled 2024-10-03: qty 3, 28d supply, fill #1
  Filled 2024-12-09: qty 3, 28d supply, fill #2

## 2024-09-07 NOTE — Progress Notes (Signed)
 Acute Office Visit  Subjective:     Patient ID: Jamie Foster, male    DOB: 22-Nov-1966, 58 y.o.   MRN: [id number]  No chief complaint on file.   HPI Discussed the use of AI scribe software for clinical note transcription with the patient, who gave verbal consent to proceed.  History of Present Illness Jamie Foster Chief is a 58 year old male with acid reflux who presents with substernal pain and hoarseness. He would also like to address referrals and follow ups that he needs.   Gastroesophageal symptoms - Significant substernal pain and hoarseness, both new symptoms - Worsening acid reflux over the past six weeks - Prior treatment with omeprazole  without symptom relief - No recent evaluation by a gastroenterologist - No history of endoscopy or colonoscopy - No testing for H. pylori - Abdominal tenderness present - No blood in stool or hemoptysis - No dysphagia - Bowel movements are normal, soft, and regular - Recent reduction in coffee intake due to symptoms  Neck pain and neurological symptoms - Chronic neck pain attributed to a bone spur - Sharp pain with intermittent tingling and numbness in fingers - MRI cervical spine 01/2023: showed cervical spine spondylosis - History of evaluation by orthopedic specialists without satisfactory resolution of symptoms  Glycemic control - Uncontrolled blood glucose levels - No recent appointment with an endocrinologist despite prior recommendation  Alcohol use - Occasional alcohol consumption, primarily during parties    ROS See HPI.     Objective:    BP (!) 149/91   Pulse 82   Ht 6' 3 (1.905 m)   Wt 225 lb (102.1 kg)   SpO2 99%   BMI 28.12 kg/m  BP Readings from Last 3 Encounters:  09/08/24 (!) 162/74  09/07/24 (!) 149/91  08/24/24 (!) 180/104   Wt Readings from Last 3 Encounters:  09/07/24 225 lb (102.1 kg)  08/24/24 226 lb (102.5 kg)  06/30/24 232 lb (105.2 kg)      Physical Exam Constitutional:       Appearance: Normal appearance.  HENT:     Head: Normocephalic.  Cardiovascular:     Rate and Rhythm: Normal rate and regular rhythm.  Pulmonary:     Effort: Pulmonary effort is normal.     Breath sounds: Normal breath sounds.  Abdominal:     Tenderness: There is abdominal tenderness. There is no right CVA tenderness or left CVA tenderness.     Comments: General epigastric tenderness  Musculoskeletal:     Comments: Pain with ROM of neck and right shoulder.   Neurological:     General: No focal deficit present.     Mental Status: He is alert and oriented to person, place, and time.  Psychiatric:        Mood and Affect: Mood normal.          Assessment & Plan:  SABRASABRADiagnoses and all orders for this visit:  Epigastric pain -     pantoprazole (PROTONIX) 40 MG tablet; Take 1 tablet (40 mg total) by mouth 2 (two) times daily before a meal. -     H. pylori breath test -     CMP14+EGFR -     CBC w/Diff/Platelet -     Lipase -     lidocaine (XYLOCAINE) 2 % viscous mouth solution 15 mL -     hyoscyamine (ANASPAZ) disintergrating tablet 0.125 mg -     alum & mag hydroxide-simeth (MAALOX PLUS) 400-400-40 MG/5ML suspension 30 mL -  Ambulatory referral to Gastroenterology  Gastroesophageal reflux disease with esophagitis without hemorrhage -     pantoprazole (PROTONIX) 40 MG tablet; Take 1 tablet (40 mg total) by mouth 2 (two) times daily before a meal. -     H. pylori breath test -     CMP14+EGFR -     CBC w/Diff/Platelet -     Ambulatory referral to Gastroenterology  Uncontrolled type 2 diabetes mellitus with hyperglycemia (HCC) -     Ambulatory referral to Endocrinology -     Semaglutide , 2 MG/DOSE, 8 MG/3ML SOPN; Inject 2 mg as directed once a week. -     Ambulatory referral to Endocrinology  Chronic pain in right shoulder -     Ambulatory referral to Orthopedics  Chronic neck pain -     Ambulatory referral to Orthopedics   Assessment & Plan Gastroesophageal  reflux disease with hoarseness and substernal pain Worsening symptoms over six weeks with substernal pain and hoarseness. Off GI medications. Previous treatments ineffective. Possible exacerbated acid production or H. pylori infection. Alcohol and caffeine may contribute. - Order H. pylori breath test. - Administer GI cocktail in office. - CBC/CMP/LIPASE ordered to rule out pancreatitis/liver/kidney dsyfunction - Prescribe pantoprazole bid.  - Refer to gastroenterologist for endoscopy and possible biopsies.  Type 2 diabetes mellitus, uncontrolled Blood sugar levels uncontrolled. Has not seen endocrinologist despite referral. - Refer to endocrinologist for diabetes management.  Cervical radiculopathy due to bone spur Chronic neck pain with intermittent sharp pain and tingling in fingers.  - Referral to ortho for spine  Chronic right shoulder pain - referral to orthopedic - last MRI 2015, showed full thickness partial anterior supraspinatus tear and moderal tendinopathy of rotator cuff muscles - arthritis of ACE joint - subacromial bursitis  General Health Maintenance Reduced caffeine intake due to symptom exacerbation. Minimal alcohol consumption, no binge drinking concern.   Spent 40 minutes with patient discussing symptoms, making plan and coordinating care.   Shima Compere, PA-C

## 2024-09-07 NOTE — Patient Instructions (Addendum)
 Will get labs.  Will get breath test GI cocktail given today Start protonix 40mg  2x a day with meals Increased ozempic  to 2mg  weekly Referral made to endocrinology Referral made to gastroenterology Referral made to orthopedics

## 2024-09-08 ENCOUNTER — Other Ambulatory Visit: Payer: Self-pay

## 2024-09-08 ENCOUNTER — Ambulatory Visit (INDEPENDENT_AMBULATORY_CARE_PROVIDER_SITE_OTHER): Admitting: Otolaryngology

## 2024-09-08 ENCOUNTER — Encounter (INDEPENDENT_AMBULATORY_CARE_PROVIDER_SITE_OTHER): Payer: Self-pay | Admitting: Otolaryngology

## 2024-09-08 ENCOUNTER — Other Ambulatory Visit (HOSPITAL_BASED_OUTPATIENT_CLINIC_OR_DEPARTMENT_OTHER): Payer: Self-pay

## 2024-09-08 VITALS — BP 162/74 | HR 105

## 2024-09-08 DIAGNOSIS — K219 Gastro-esophageal reflux disease without esophagitis: Secondary | ICD-10-CM | POA: Diagnosis not present

## 2024-09-08 DIAGNOSIS — R490 Dysphonia: Secondary | ICD-10-CM

## 2024-09-08 DIAGNOSIS — R053 Chronic cough: Secondary | ICD-10-CM | POA: Diagnosis not present

## 2024-09-08 DIAGNOSIS — R0981 Nasal congestion: Secondary | ICD-10-CM

## 2024-09-08 DIAGNOSIS — J312 Chronic pharyngitis: Secondary | ICD-10-CM

## 2024-09-08 DIAGNOSIS — J320 Chronic maxillary sinusitis: Secondary | ICD-10-CM

## 2024-09-08 MED ORDER — METHYLPREDNISOLONE 4 MG PO TBPK
ORAL_TABLET | ORAL | 1 refills | Status: AC
  Filled 2024-09-08: qty 21, 6d supply, fill #0

## 2024-09-08 MED ORDER — AMOXICILLIN-POT CLAVULANATE 875-125 MG PO TABS
1.0000 | ORAL_TABLET | Freq: Two times a day (BID) | ORAL | 0 refills | Status: DC
  Filled 2024-09-08: qty 20, 10d supply, fill #0

## 2024-09-08 MED ORDER — LEVOCETIRIZINE DIHYDROCHLORIDE 5 MG PO TABS
5.0000 mg | ORAL_TABLET | Freq: Every evening | ORAL | 3 refills | Status: AC
  Filled 2024-09-08: qty 30, 30d supply, fill #0

## 2024-09-08 MED ORDER — FLUTICASONE PROPIONATE 50 MCG/ACT NA SUSP
2.0000 | Freq: Every day | NASAL | 6 refills | Status: AC
  Filled 2024-09-08: qty 16, 30d supply, fill #0

## 2024-09-08 NOTE — Progress Notes (Signed)
 ENT CONSULT:  Reason for Consult: chronic cough    HPI: Discussed the use of AI scribe software for clinical note transcription with the patient, who gave verbal consent to proceed.  History of Present Illness Jamie Foster Chief is a 58 year old male with chronic sinus issues and two sinus surgeries in the past who presents with persistent sinus infection and chronic cough. He was referred by Dr. Darlean from Pulmonary for evaluation of chronic cough.  He has a persistent sinus infection on CT max/face done in August and chronic cough. He has a history of sinus surgery in 2020 that he feels worsened his condition, leading to frequent infections. He describes that 'everything goes into that nostril' and attributes some symptoms to his cultural background, which involves a system that stops dust from entering the nose. He refused a second sinus surgery and continues to experience infections.  Only the right cheek sinus is affected, which was the site of his previous surgery. He has dental implants in the upper jaw. He experiences lingering mucus in the affected sinus and has not found relief from previous treatments, including nasal saline rinses. He is not currently using any nasal sprays or allergy pills and has previously used a nasal rinse with warm water without effectiveness.  He experiences harshness in his voice, which he associates with his chronic  cough. He was recently prescribed Protonix for reflux but has not yet picked it up.   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  developed problems with sinuses in [place] requiring surgery and now referred to pulmonary clinic 06/30/2024 by Dr Colette  for cough    While in [place] in 2001 [place] difficulty breathing thru nose and did surgery no benefit then moved to [facility] in 2006 with allergy w/u by Ladora First  neg rx flonase no better self rx with vicks vapor rub prn x Fe 2025     [place] Feb 2025  swampy and started coughing w/in a month  - CXR ok  - Allergy screen 06/30/2024 >  Eos 0.2 /  IgE  33 - Sinus CT 07/11/24  Prior endoscopic sinus surgery with moderate right and mild left maxillary sinus mucosal thickening, potentially in part odontogenic given periapical lucency of the posterior-most right mandibular molar > refer to ENT    Past Medical History:  Diagnosis Date   Allergy    Diabetes mellitus without complication (HCC)    GERD (gastroesophageal reflux disease)    H pylori ulcer    Hyperlipidemia    Hypertension    Kidney stone    Sleep apnea     Past Surgical History:  Procedure Laterality Date   NASAL SINUS SURGERY     deviated septum    Family History  Problem Relation Age of Onset   Hypertension Mother    Prostate cancer Father    Colon cancer Neg Hx    Colon polyps Neg Hx     Social History:  reports that he has never smoked. He has never used smokeless tobacco. He reports that he does not drink alcohol and does not use drugs.  Allergies:  Allergies  Allergen Reactions   Lisinopril  Cough   Metformin  And Related Other (See Comments)    Abdominal pain    Medications: I have reviewed the patient's current medications.  The PMH, PSH, Medications, Allergies, and SH were reviewed and updated.  ROS: Constitutional: Negative for fever, weight loss and weight gain. Cardiovascular: Negative for chest pain and dyspnea on exertion. Respiratory: Is not experiencing shortness of breath at rest. Gastrointestinal: Negative for nausea and vomiting. Neurological: Negative for headaches. Psychiatric: The patient is not nervous/anxious  Blood pressure (!) 162/74, pulse (!) 105, SpO2 94%. There is no height or weight on file to calculate BMI.  PHYSICAL EXAM:  Exam: General: Well-developed, well-nourished Communication and Voice: slightly raspy Respiratory Respiratory effort: Equal inspiration and expiration without  stridor Cardiovascular Peripheral Vascular: Warm extremities with equal color/perfusion Eyes: No nystagmus with equal extraocular motion bilaterally Neuro/Psych/Balance: Patient oriented to person, place, and time; Appropriate mood and affect; Gait is intact with no imbalance; Cranial nerves I-XII are intact Head and Face Inspection: Normocephalic and atraumatic without mass or lesion Palpation: Facial skeleton intact without bony stepoffs Salivary Glands: No mass or tenderness Facial Strength: Facial motility symmetric and full bilaterally ENT Pinna: External ear intact and fully developed External canal: Canal is patent with intact skin Tympanic Membrane: Clear and mobile External Nose: No scar or anatomic deformity Internal Nose: Septum is straight. No polyp, or purulence. Mucosal edema and erythema present.  Bilateral inferior turbinate hypertrophy.  Lips, Teeth, and gums: Mucosa and teeth intact and viable TMJ: No pain to palpation with full mobility Oral cavity/oropharynx: No erythema or exudate, no lesions present Nasopharynx: No mass or lesion with intact mucosa Hypopharynx: Intact mucosa without pooling of secretions Larynx Glottic: Full true vocal cord mobility without lesion or mass Supraglottic: Normal appearing epiglottis and AE folds Interarytenoid Space: Moderate pachydermia&edema Subglottic Space: Patent without lesion or edema Neck Neck and Trachea: Midline trachea without mass or lesion Thyroid : No mass or nodularity Lymphatics: No lymphadenopathy  Procedure: Preoperative diagnosis: chronic cough  Postoperative diagnosis:   Same  Procedure: Flexible fiberoptic laryngoscopy  Surgeon: Elena Larry, MD  Anesthesia: Topical lidocaine and Afrin Complications: None Condition is stable throughout exam  Indications and consent:  The patient presents to the clinic with above symptoms. Indirect laryngoscopy view was incomplete. Thus it was recommended that  they undergo a flexible fiberoptic laryngoscopy. All of the risks, benefits, and potential complications were reviewed with the patient preoperatively and verbal informed consent was obtained.  Procedure: The patient was seated upright in the clinic. Topical lidocaine and Afrin were applied to the nasal cavity. After adequate anesthesia had occurred, I then proceeded to pass the flexible telescope into the nasal cavity. The nasal cavity was patent without rhinorrhea or polyp. The nasopharynx was also patent without mass or lesion. The base of tongue was visualized and was normal. There were no signs of pooling of secretions in the piriform sinuses. The true vocal folds were mobile bilaterally. There were no signs of glottic or supraglottic mucosal lesion or mass. There was moderate interarytenoid pachydermia and post cricoid edema. The telescope was then slowly withdrawn and the patient tolerated the procedure throughout.   PROCEDURE NOTE: nasal endoscopy  Preoperative diagnosis: chronic sinusitis symptoms  Postoperative diagnosis: same  Procedure: Diagnostic nasal endoscopy (68768)  Surgeon: Elena Larry, M.D.  Anesthesia: Topical lidocaine and Afrin  H&P REVIEW: The patient's history and physical were reviewed today prior to procedure. All medications were reviewed and updated as well. Complications: None Condition is stable throughout exam Indications and consent: The patient presents with symptoms of chronic sinusitis not responding to previous therapies. All the risks, benefits, and potential complications were reviewed with the patient preoperatively and informed consent was obtained. The time out was completed  with confirmation of the correct procedure.   Procedure: The patient was seated upright in the clinic. Topical lidocaine and Afrin were applied to the nasal cavity. After adequate anesthesia had occurred, the rigid nasal endoscope was passed into the nasal cavity. The nasal  mucosa, turbinates, septum, and sinus drainage pathways were visualized bilaterally. This revealed no purulence or significant secretions that might be cultured. There were no polyps or sites of significant inflammation. The mucosa was intact and there was no crusting present. The scope was then slowly withdrawn and the patient tolerated the procedure well. There were no complications or blood loss.    Studies Reviewed: CT max face 07/11/24 IMPRESSION: Prior endoscopic sinus surgery with moderate right and mild left maxillary sinus mucosal thickening, potentially in part odontogenic given periapical lucency of the posterior-most right mandibular molar.   Assessment/Plan: Encounter Diagnoses  Name Primary?   Dysphonia Yes   Hoarseness    Chronic GERD    Chronic nasal congestion    Chronic sore throat    Chronic maxillary sinusitis     Assessment and Plan Assessment & Plan Chronic right maxillary sinusitis, likely odontogenic Chronic right maxillary sinusitis likely related to odontogenic origin, indicated by lucency around the tooth implant. Persistent sinus disease isolated to the right maxillary sinus, possibly due to an open space above the tooth implant. Previous sinus surgery in 2020 did not alleviate symptoms and may have exacerbated them. Our evaluation today suggests a link between sinusitis and the dental implant. Nasal endoscopy without polyps or masses - Refer to dentist for evaluation of the tooth implant and potential extraction. - Prescribed Augmentin  and Medrol  dose pack for sinus infection. - we discussed maxillary antrostomy and evacuation of sinus contents, risks and benefits of observation vs surgery, and he would like to proceed with surgery.  Chronic hoarseness Flexible scope exam without masses or lesions  - we discussed that treatment of post-nasal drainage and reflux will likely improve his hoarseness - will consider speech therapy in the future  - sent Rx  for Xyzal and Flonase to the pharmacy  Gastroesophageal reflux disease (GERD) GERD with symptoms of abdominal pain and hoarseness. Reflux may contribute to mucus production and throat irritation. Hoarseness and difficulty coughing may be related to GERD. - initiate Protonix (already has a script has to pick up) -  Reflux Gourmet after meals - diet and lifestyle changes to minimize GERD - Refer to [employer] blog for dietary and lifestyle modifications/reflux cook book    Thank you for allowing me to participate in the care of this patient. Please do not hesitate to contact me with any questions or concerns.   Elena Larry, MD Otolaryngology [facility] Phone: [phone] Fax: [phone]    09/08/2024, 2:48 PM

## 2024-09-08 NOTE — Progress Notes (Signed)
 Patient is having BP managed.

## 2024-09-08 NOTE — Patient Instructions (Addendum)
 Lloyd Huger Med Nasal Saline Rinse   - start nasal saline rinses with NeilMed Bottle available over the counter or online to help with nasal congestion     GamingLesson.[email or web address] - check out this website to learn more about reflux   -Avoid lying down for at least two hours after a meal or after drinking acidic beverages, like soda, or other caffeinated beverages. This can help to prevent stomach contents from flowing back into the esophagus. -Keep your head elevated while you sleep. Using an extra pillow or two can also help to prevent reflux. -Eat smaller and more frequent meals each day instead of a few large meals. This promotes digestion and can aid in preventing heartburn. -Wear loose-fitting clothes to ease pressure on the stomach, which can worsen heartburn and reflux. -Reduce excess weight around the midsection. This can ease pressure on the stomach. Such pressure can force some stomach contents back up the esophagus  - Take Reflux Gourmet (natural supplement available on [employer]) to help with symptoms of chronic throat irritation

## 2024-09-09 ENCOUNTER — Encounter: Payer: Self-pay | Admitting: Physician Assistant

## 2024-09-09 ENCOUNTER — Ambulatory Visit: Payer: Self-pay | Admitting: Physician Assistant

## 2024-09-09 ENCOUNTER — Other Ambulatory Visit (HOSPITAL_BASED_OUTPATIENT_CLINIC_OR_DEPARTMENT_OTHER): Payer: Self-pay

## 2024-09-09 DIAGNOSIS — G8929 Other chronic pain: Secondary | ICD-10-CM | POA: Insufficient documentation

## 2024-09-09 DIAGNOSIS — R1013 Epigastric pain: Secondary | ICD-10-CM | POA: Insufficient documentation

## 2024-09-09 LAB — CMP14+EGFR
ALT: 25 IU/L (ref 0–44)
AST: 20 IU/L (ref 0–40)
Albumin: 4.3 g/dL (ref 3.8–4.9)
Alkaline Phosphatase: 66 IU/L (ref 47–123)
BUN/Creatinine Ratio: 14 (ref 10–24)
BUN: 17 mg/dL (ref 8–27)
Bilirubin Total: 0.5 mg/dL (ref 0.0–1.2)
CO2: 24 mmol/L (ref 20–29)
Calcium: 9.5 mg/dL (ref 8.6–10.2)
Chloride: 101 mmol/L (ref 96–106)
Creatinine, Ser: 1.23 mg/dL (ref 0.76–1.27)
Globulin, Total: 2.6 g/dL (ref 1.5–4.5)
Glucose: 170 mg/dL — ABNORMAL HIGH (ref 70–99)
Potassium: 4.3 mmol/L (ref 3.5–5.2)
Sodium: 138 mmol/L (ref 134–144)
Total Protein: 6.9 g/dL (ref 6.0–8.5)
eGFR: 67 mL/min/1.73 (ref >59)

## 2024-09-09 LAB — CBC WITH DIFFERENTIAL/PLATELET
Basophils Absolute: 0.1 x10E3/uL (ref 0.0–0.2)
Basos: 1 %
EOS (ABSOLUTE): 0.2 x10E3/uL (ref 0.0–0.4)
Eos: 3 %
Hematocrit: 42.8 % (ref 37.5–51.0)
Hemoglobin: 13.6 g/dL (ref 13.0–17.7)
Immature Grans (Abs): 0 x10E3/uL (ref 0.0–0.1)
Immature Granulocytes: 0 %
Lymphocytes Absolute: 2 x10E3/uL (ref 0.7–3.1)
Lymphs: 34 %
MCH: 30 pg (ref 26.6–33.0)
MCHC: 31.8 g/dL (ref 31.5–35.7)
MCV: 95 fL (ref 79–97)
Monocytes Absolute: 0.7 x10E3/uL (ref 0.1–0.9)
Monocytes: 11 %
Neutrophils Absolute: 3 x10E3/uL (ref 1.4–7.0)
Neutrophils: 51 %
Platelets: 305 x10E3/uL (ref 150–450)
RBC: 4.53 x10E6/uL (ref 4.14–5.80)
RDW: 12.5 % (ref 11.6–15.4)
WBC: 5.9 x10E3/uL (ref 3.4–10.8)

## 2024-09-09 LAB — H. PYLORI BREATH TEST: H pylori Breath Test: POSITIVE — AB

## 2024-09-09 LAB — LIPASE: Lipase: 25 U/L (ref 13–78)

## 2024-09-09 MED ORDER — CLARITHROMYCIN 500 MG PO TABS
500.0000 mg | ORAL_TABLET | Freq: Two times a day (BID) | ORAL | 0 refills | Status: DC
  Filled 2024-09-09: qty 28, 14d supply, fill #0

## 2024-09-09 MED ORDER — METRONIDAZOLE 500 MG PO TABS
500.0000 mg | ORAL_TABLET | Freq: Two times a day (BID) | ORAL | 0 refills | Status: DC
  Filled 2024-09-09: qty 28, 14d supply, fill #0

## 2024-09-09 MED FILL — Amoxicillin (Trihydrate) Cap 500 MG: 1000.0000 mg | ORAL | 14 days supply | Qty: 56 | Fill #0 | Status: AC

## 2024-09-09 NOTE — Progress Notes (Signed)
 You tested positive for h.pylori. This is a bacteria in your stomach that causes excessive acid production. We need to take multiple medications to get rid of it.   You have the pantoprazole to take twice day for 14 days.  Will send Clarithyromycin 500mg  twice a day for 14 days.  Amoxicillin  1g twice daily for 14 days.  Metronidazole 500mg  twice daily for 14.   After the 14 days stay off everything and then come back in to have another breath test to check for clearance.

## 2024-09-09 NOTE — Progress Notes (Signed)
 Pending h.pylori testing.  Normal pancreatic enzymes Kidney and liver look good

## 2024-09-13 ENCOUNTER — Telehealth (INDEPENDENT_AMBULATORY_CARE_PROVIDER_SITE_OTHER): Payer: Self-pay | Admitting: Otolaryngology

## 2024-09-13 DIAGNOSIS — H524 Presbyopia: Secondary | ICD-10-CM | POA: Diagnosis not present

## 2024-09-13 DIAGNOSIS — H5203 Hypermetropia, bilateral: Secondary | ICD-10-CM | POA: Diagnosis not present

## 2024-09-13 DIAGNOSIS — H2513 Age-related nuclear cataract, bilateral: Secondary | ICD-10-CM | POA: Diagnosis not present

## 2024-09-13 DIAGNOSIS — E113213 Type 2 diabetes mellitus with mild nonproliferative diabetic retinopathy with macular edema, bilateral: Secondary | ICD-10-CM | POA: Diagnosis not present

## 2024-09-13 DIAGNOSIS — H04123 Dry eye syndrome of bilateral lacrimal glands: Secondary | ICD-10-CM | POA: Diagnosis not present

## 2024-09-13 DIAGNOSIS — H52203 Unspecified astigmatism, bilateral: Secondary | ICD-10-CM | POA: Diagnosis not present

## 2024-09-13 DIAGNOSIS — Z7984 Long term (current) use of oral hypoglycemic drugs: Secondary | ICD-10-CM | POA: Diagnosis not present

## 2024-09-13 DIAGNOSIS — H43393 Other vitreous opacities, bilateral: Secondary | ICD-10-CM | POA: Diagnosis not present

## 2024-09-13 NOTE — Telephone Encounter (Signed)
 I left a message for the patient, returning his call, I left a very detailed message explaining that Dr Okey is no longer in our office and that Dr Tobie is taking over his case and we need to get him scheduled for an appointment in office with Dr Tobie and then we will be able to schedule surgery.

## 2024-09-15 ENCOUNTER — Ambulatory Visit (INDEPENDENT_AMBULATORY_CARE_PROVIDER_SITE_OTHER): Admitting: Otolaryngology

## 2024-09-15 DIAGNOSIS — M5416 Radiculopathy, lumbar region: Secondary | ICD-10-CM | POA: Diagnosis not present

## 2024-09-15 DIAGNOSIS — M5459 Other low back pain: Secondary | ICD-10-CM | POA: Diagnosis not present

## 2024-09-15 DIAGNOSIS — M542 Cervicalgia: Secondary | ICD-10-CM | POA: Diagnosis not present

## 2024-09-15 DIAGNOSIS — G8929 Other chronic pain: Secondary | ICD-10-CM | POA: Diagnosis not present

## 2024-09-15 DIAGNOSIS — M25512 Pain in left shoulder: Secondary | ICD-10-CM | POA: Diagnosis not present

## 2024-09-15 DIAGNOSIS — M25511 Pain in right shoulder: Secondary | ICD-10-CM | POA: Diagnosis not present

## 2024-09-16 ENCOUNTER — Other Ambulatory Visit (HOSPITAL_BASED_OUTPATIENT_CLINIC_OR_DEPARTMENT_OTHER): Payer: Self-pay

## 2024-09-16 MED ORDER — CELECOXIB 100 MG PO CAPS
100.0000 mg | ORAL_CAPSULE | Freq: Two times a day (BID) | ORAL | 0 refills | Status: AC
  Filled 2024-09-16: qty 60, 30d supply, fill #0

## 2024-09-21 ENCOUNTER — Other Ambulatory Visit: Payer: Self-pay | Admitting: Family Medicine

## 2024-09-21 ENCOUNTER — Other Ambulatory Visit (HOSPITAL_BASED_OUTPATIENT_CLINIC_OR_DEPARTMENT_OTHER): Payer: Self-pay

## 2024-09-21 ENCOUNTER — Other Ambulatory Visit: Payer: Self-pay

## 2024-09-21 DIAGNOSIS — E1165 Type 2 diabetes mellitus with hyperglycemia: Secondary | ICD-10-CM

## 2024-09-21 MED ORDER — LANTUS SOLOSTAR 100 UNIT/ML ~~LOC~~ SOPN
18.0000 [IU] | PEN_INJECTOR | Freq: Every day | SUBCUTANEOUS | 11 refills | Status: DC
  Filled 2024-09-21: qty 15, 83d supply, fill #0
  Filled 2024-12-09: qty 15, 83d supply, fill #1

## 2024-09-21 MED ORDER — GLIPIZIDE 10 MG PO TABS
10.0000 mg | ORAL_TABLET | Freq: Every morning | ORAL | 1 refills | Status: AC
  Filled 2024-09-21 – 2024-10-03 (×2): qty 90, 90d supply, fill #0
  Filled 2024-12-09 – 2024-12-16 (×3): qty 90, 90d supply, fill #1

## 2024-09-24 DIAGNOSIS — M545 Low back pain, unspecified: Secondary | ICD-10-CM | POA: Diagnosis not present

## 2024-09-24 DIAGNOSIS — M542 Cervicalgia: Secondary | ICD-10-CM | POA: Diagnosis not present

## 2024-09-26 ENCOUNTER — Other Ambulatory Visit (HOSPITAL_BASED_OUTPATIENT_CLINIC_OR_DEPARTMENT_OTHER): Payer: Self-pay

## 2024-09-27 DIAGNOSIS — M6281 Muscle weakness (generalized): Secondary | ICD-10-CM | POA: Diagnosis not present

## 2024-09-27 DIAGNOSIS — S46011D Strain of muscle(s) and tendon(s) of the rotator cuff of right shoulder, subsequent encounter: Secondary | ICD-10-CM | POA: Diagnosis not present

## 2024-10-03 ENCOUNTER — Other Ambulatory Visit (HOSPITAL_BASED_OUTPATIENT_CLINIC_OR_DEPARTMENT_OTHER): Payer: Self-pay

## 2024-10-04 ENCOUNTER — Other Ambulatory Visit: Payer: Self-pay | Admitting: Family Medicine

## 2024-10-04 DIAGNOSIS — R21 Rash and other nonspecific skin eruption: Secondary | ICD-10-CM

## 2024-10-10 ENCOUNTER — Other Ambulatory Visit (HOSPITAL_BASED_OUTPATIENT_CLINIC_OR_DEPARTMENT_OTHER): Payer: Self-pay

## 2024-10-10 ENCOUNTER — Other Ambulatory Visit: Payer: Self-pay | Admitting: Family Medicine

## 2024-10-10 DIAGNOSIS — K219 Gastro-esophageal reflux disease without esophagitis: Secondary | ICD-10-CM

## 2024-10-21 ENCOUNTER — Other Ambulatory Visit (HOSPITAL_BASED_OUTPATIENT_CLINIC_OR_DEPARTMENT_OTHER): Payer: Self-pay

## 2024-10-24 ENCOUNTER — Other Ambulatory Visit (HOSPITAL_BASED_OUTPATIENT_CLINIC_OR_DEPARTMENT_OTHER): Payer: Self-pay

## 2024-10-24 MED ORDER — AMPHETAMINE-DEXTROAMPHETAMINE 30 MG PO TABS
30.0000 mg | ORAL_TABLET | Freq: Two times a day (BID) | ORAL | 0 refills | Status: AC
  Filled 2024-10-24: qty 180, 90d supply, fill #0

## 2024-10-25 ENCOUNTER — Other Ambulatory Visit (HOSPITAL_BASED_OUTPATIENT_CLINIC_OR_DEPARTMENT_OTHER): Payer: Self-pay

## 2024-12-09 ENCOUNTER — Other Ambulatory Visit: Payer: Self-pay | Admitting: Family Medicine

## 2024-12-09 ENCOUNTER — Other Ambulatory Visit (HOSPITAL_BASED_OUTPATIENT_CLINIC_OR_DEPARTMENT_OTHER): Payer: Self-pay

## 2024-12-09 ENCOUNTER — Ambulatory Visit: Payer: Self-pay

## 2024-12-09 DIAGNOSIS — E1165 Type 2 diabetes mellitus with hyperglycemia: Secondary | ICD-10-CM

## 2024-12-09 DIAGNOSIS — E782 Mixed hyperlipidemia: Secondary | ICD-10-CM

## 2024-12-09 DIAGNOSIS — R21 Rash and other nonspecific skin eruption: Secondary | ICD-10-CM

## 2024-12-09 NOTE — Telephone Encounter (Signed)
 FYI Only or Action Required?: FYI only for provider: urgent care advised.  Patient was last seen in primary care on 09/07/2024 by Antoniette Vermell CROME, PA-C.  Called Nurse Triage reporting Otalgia.  Symptoms began 2 days ago.  Interventions attempted: Nothing.  Symptoms are: gradually worsening.  Triage Disposition: See HCP Within 4 Hours (Or PCP Triage)  Patient/caregiver understands and will follow disposition?: Yes               Message from [place] S sent at 12/09/2024 11:53 AM EST  Reason for Triage: Patient has had left ear pain for 2 days.   Reason for Disposition  Diabetes mellitus or weak immune system (e.g., HIV positive, cancer chemo, splenectomy, organ transplant, chronic steroids)  Answer Assessment - Initial Assessment Questions 1. LOCATION: Which ear is involved?     Left.  2. ONSET: When did the ear pain start?      2 days, worsening since yesterday.  3. SEVERITY: How bad is the pain?  (Scale 1-10; mild, moderate or severe)     9/10. Tender to touch.  4. URI SYMPTOMS: Do you have a runny nose or cough?     No runny nose.  5. FEVER: Do you have a fever? If Yes, ask: What is your temperature, how was it measured, and when did it start?     No.  6. CAUSE: Have you been swimming recently?, How often do you use Q-TIPS?, Have you had any recent air travel or scuba diving?     No.  7. OTHER SYMPTOMS: Do you have any other symptoms? (e.g., decreased hearing, dizziness, headache, stiff neck, vomiting)     Post nasal drip and occasional cough x 2 days. No fever, swelling, discharge from left ear or decreased hearing.  Protocols used: Rilla

## 2024-12-09 NOTE — Telephone Encounter (Signed)
 Copied from CRM #[id number]. Topic: Clinical - Medication Refill >> Dec 09, 2024 11:49 AM Wess RAMAN wrote: Medication: Continuous Glucose Sensor (FREESTYLE LIBRE 3 PLUS SENSOR) MISC  GLUCOTROL  XL 5 MG 24 hr tablet  ketoconazole  (NIZORAL ) 2 % cream  Semaglutide , 2 MG/DOSE, 8 MG/3ML SOPN  insulin  glargine (LANTUS  SOLOSTAR) 100 UNIT/ML Solostar Pen  rosuvastatin  (CRESTOR ) 10 MG tablet   Has the patient contacted their pharmacy? No (Agent: If no, request that the patient contact the pharmacy for the refill. If patient does not wish to contact the pharmacy document the reason why and proceed with request.) (Agent: If yes, when and what did the pharmacy advise?)  This is the patient's preferred pharmacy:  [facility] [place] [facility] [address] [place] [address] Phone: [phone] Fax: [phone]  Is this the correct pharmacy for this prescription? Yes If no, delete pharmacy and type the correct one.   Has the prescription been filled recently? Yes  Is the patient out of the medication? Yes  Has the patient been seen for an appointment in the last year OR does the patient have an upcoming appointment? Yes  Can we respond through MyChart? No  Agent: Please be advised that Rx refills may take up to 3 business days. We ask that you follow-up with your pharmacy.

## 2024-12-10 ENCOUNTER — Ambulatory Visit
Admission: EM | Admit: 2024-12-10 | Discharge: 2024-12-10 | Disposition: A | Discharge status: Home / Self Care | Attending: Family Medicine | Admitting: Family Medicine

## 2024-12-10 ENCOUNTER — Encounter: Payer: Self-pay | Admitting: Emergency Medicine

## 2024-12-10 DIAGNOSIS — H6692 Otitis media, unspecified, left ear: Secondary | ICD-10-CM

## 2024-12-10 MED ORDER — AMOXICILLIN-POT CLAVULANATE 875-125 MG PO TABS: 1.0000 | ORAL_TABLET | Freq: Two times a day (BID) | ORAL | 0 refills | Status: AC

## 2024-12-10 NOTE — ED Provider Notes (Signed)
 " [facility]    CSN: [id number] Arrival date & time: 12/10/24  1508      History   Chief Complaint Chief Complaint  Patient presents with   Otalgia    HPI Jamie Foster is a 59 y.o. male.   HPI  Past Medical History:  Diagnosis Date   Allergy    Diabetes mellitus without complication (HCC)    GERD (gastroesophageal reflux disease)    H pylori ulcer    Hyperlipidemia    Hypertension    Kidney stone    Sleep apnea     Patient Active Problem List   Diagnosis Date Noted   Epigastric pain 09/09/2024   Chronic pain in right shoulder 09/09/2024   Chronic neck pain 09/09/2024   Upper airway cough syndrome 06/30/2024   Cough in adult 02/24/2023   Shortness of breath 02/24/2023   Mild nonproliferative diabetic retinopathy of both eyes without macular edema associated with type 2 diabetes mellitus (HCC) 08/21/2022   LPRD (laryngopharyngeal reflux disease) 07/24/2022   Tympanic membrane disorder, left 07/24/2022   Uncontrolled type 2 diabetes mellitus with hyperglycemia (HCC) 08/20/2020   Hyperlipidemia 01/23/2019   Low back pain without sciatica 01/23/2019   Uncontrolled type 2 diabetes mellitus with retinopathy, without long-term current use of insulin  12/21/2015   Family hx of prostate cancer 06/22/2013   Allergic rhinitis 06/22/2013   OSA (obstructive sleep apnea) 10/06/2012   BMI 30.0-30.9,adult 07/19/2012   Essential hypertension 07/19/2012   GERD (gastroesophageal reflux disease) 07/19/2012   DDD (degenerative disc disease), cervical 07/19/2012    Past Surgical History:  Procedure Laterality Date   NASAL SINUS SURGERY     deviated septum       Home Medications    Prior to Admission medications  Medication Sig Start Date End Date Taking? Authorizing Provider  amoxicillin -clavulanate (AUGMENTIN ) 875-125 MG tablet Take 1 tablet by mouth 2 (two) times daily for 10 days. 12/10/24 12/20/24 Yes Teddy Sharper, FNP  valsartan  (DIOVAN ) 160 MG tablet  Take 1 tablet (160 mg total) by mouth daily. 08/24/24  Yes Darlean Sharper NOVAK, MD  amphetamine -dextroamphetamine  (ADDERALL) 30 MG tablet Take 1 tablet by mouth 2 (two) times daily. TAKE ONE TABLET IN MORNING AND ONE TABLET AT NOON. 10/23/24     celecoxib  (CELEBREX ) 100 MG capsule Take 1 capsule (100 mg total) by mouth 2 (two) times daily. 09/16/24     Continuous Glucose Sensor (FREESTYLE LIBRE 3 PLUS SENSOR) MISC Change sensor every 14 (fourteen) days. Patient not taking: Reported on 08/24/2024 10/12/23   Colette Torrence GRADE, MD  Continuous Glucose Sensor (FREESTYLE LIBRE 3 PLUS SENSOR) MISC Apply 1 sensor as directed to check blood sugar and change every 15 (fifteen) days. Patient not taking: Reported on 08/24/2024 02/18/24     famotidine  (PEPCID ) 20 MG tablet One after supper Patient not taking: Reported on 08/24/2024 06/30/24   Darlean Sharper NOVAK, MD  fluticasone  (FLONASE ) 50 MCG/ACT nasal spray Place 2 sprays into both nostrils daily. 09/08/24   Soldatova, Liuba, MD  glipiZIDE  (GLUCOTROL ) 10 MG tablet TAKE 1 TABLET BY MOUTH EVERY MORNING 09/21/24     GLUCOTROL  XL 5 MG 24 hr tablet Take 1 tablet (5 mg total) by mouth 2 (two) times daily. 05/09/24   Colette Torrence GRADE, MD  insulin  glargine (LANTUS  SOLOSTAR) 100 UNIT/ML Solostar Pen Inject 18 Units into the skin at bedtime. 09/21/24   Booker Darice SAUNDERS, FNP  Insulin  Pen Needle 32G X 4 MM MISC Use as directed 1 time  a day. 05/30/24   Colette Torrence GRADE, MD  ketoconazole  (NIZORAL ) 2 % cream Apply liberally to affected area once a day. 09/07/24 09/07/25    levocetirizine (XYZAL  ALLERGY 24HR) 5 MG tablet Take 1 tablet (5 mg total) by mouth every evening. 09/08/24   Soldatova, Liuba, MD  methylPREDNISolone  (MEDROL  DOSEPAK) 4 MG TBPK tablet Take with signs of chronic sinusitis and take as directed 09/08/24   Soldatova, Liuba, MD  pantoprazole  (PROTONIX ) 40 MG tablet Take 1 tablet (40 mg total) by mouth 2 (two) times daily before a meal. 09/07/24   Breeback, Jade L, PA-C   rosuvastatin  (CRESTOR ) 10 MG tablet Take 1 tablet (10 mg total) by mouth daily. Patient not taking: Reported on 08/24/2024 01/22/24   Colette Torrence GRADE, MD  Semaglutide , 2 MG/DOSE, 8 MG/3ML SOPN Inject 2 mg as directed once a week. 09/07/24   Breeback, Jade L, PA-C  triamcinolone  cream (KENALOG ) 0.5 % APPLY TOPICALLY TO THE AFFECTED AREA TWICE DAILY AS NEEDED 10/04/24   Booker Darice SAUNDERS, FNP    Family History Family History  Problem Relation Age of Onset   Hypertension Mother    Prostate cancer Father    Colon cancer Neg Hx    Colon polyps Neg Hx     Social History Social History[1]   Allergies   Lisinopril  and Metformin  and related   Review of Systems Review of Systems   Physical Exam Triage Vital Signs ED Triage Vitals  Encounter Vitals Group     BP      Girls Systolic BP Percentile      Girls Diastolic BP Percentile      Boys Systolic BP Percentile      Boys Diastolic BP Percentile      Pulse      Resp      Temp      Temp src      SpO2      Weight      Height      Head Circumference      Peak Flow      Pain Score      Pain Loc      Pain Education      Exclude from Growth Chart    No data found.  Updated Vital Signs BP (!) 189/116 (BP Location: Right Arm)   Pulse (!) 103   Temp 98.1 F (36.7 C) (Oral)   Resp 18   Ht 6' 3 (1.905 m)   Wt 228 lb (103.4 kg)   SpO2 98%   BMI 28.50 kg/m   Visual Acuity Right Eye Distance:   Left Eye Distance:   Bilateral Distance:    Right Eye Near:   Left Eye Near:    Bilateral Near:     Physical Exam Vitals and nursing note reviewed.  Constitutional:      General: He is not in acute distress.    Appearance: Normal appearance. He is normal weight. He is not ill-appearing.  HENT:     Head: Normocephalic and atraumatic.     Right Ear: Tympanic membrane, ear canal and external ear normal.     Left Ear: Ear canal and external ear normal.     Ears:     Comments: Left TM: Red rimmed, erythematous     Mouth/Throat:     Mouth: Mucous membranes are moist.     Pharynx: Oropharynx is clear.  Eyes:     Extraocular Movements: Extraocular movements intact.     Conjunctiva/sclera: Conjunctivae  normal.     Pupils: Pupils are equal, round, and reactive to light.  Cardiovascular:     Rate and Rhythm: Normal rate and regular rhythm.     Pulses: Normal pulses.     Heart sounds: Normal heart sounds.  Pulmonary:     Effort: Pulmonary effort is normal.     Breath sounds: Normal breath sounds. No wheezing, rhonchi or rales.  Musculoskeletal:        General: Normal range of motion.     Cervical back: Normal range of motion and neck supple.  Skin:    General: Skin is warm and dry.  Neurological:     General: No focal deficit present.     Mental Status: He is alert and oriented to person, place, and time. Mental status is at baseline.      UC Treatments / Results  Labs (all labs ordered are listed, but only abnormal results are displayed) Labs Reviewed - No data to display  EKG   Radiology No results found.  Procedures Procedures (including critical care time)  Medications Ordered in UC Medications - No data to display  Initial Impression / Assessment and Plan / UC Course  I have reviewed the triage vital signs and the nursing notes.  Pertinent labs & imaging results that were available during my care of the patient were reviewed by me and considered in my medical decision making (see chart for details).     MDM: 1.  Acute left otitis media-Rx'd Augmentin  875/125 mg tablet: Take 1 tablet twice daily x 10 days. Advised patient take medication as directed with food to completion.  Encouraged increase daily water intake to 64 ounces per day while taking this medication.  Advised if symptoms worsen and/or unresolved please follow-up with your PCP, ENT, or here for further evaluation.  Patient discharged home, hemodynamically stable. Final Clinical Impressions(s) / UC Diagnoses   Final  diagnoses:  Acute left otitis media     Discharge Instructions      Advised patient take medication as directed with food to completion.  Encouraged increase daily water intake to 64 ounces per day while taking this medication.  Advised if symptoms worsen and/or unresolved please follow-up with your PCP, ENT, or here for further evaluation.     ED Prescriptions     Medication Sig Dispense Auth. Provider   amoxicillin -clavulanate (AUGMENTIN ) 875-125 MG tablet Take 1 tablet by mouth 2 (two) times daily for 10 days. 20 tablet Zyona Pettaway, FNP      PDMP not reviewed this encounter.    [1]  Social History Tobacco Use   Smoking status: Never   Smokeless tobacco: Never  Vaping Use   Vaping status: Never Used  Substance Use Topics   Alcohol use: No   Drug use: No     Teddy Sharper, FNP 12/10/24 1558  "

## 2024-12-10 NOTE — ED Triage Notes (Signed)
 Patient c/o bilateral ear pain x 4 days.  Afebrile.  Denies any chest congestion or cough.  Patient has taken Advil .

## 2024-12-10 NOTE — Discharge Instructions (Addendum)
 Advised patient take medication as directed with food to completion.  Encouraged increase daily water intake to 64 ounces per day while taking this medication.  Advised if symptoms worsen and/or unresolved please follow-up with your PCP, ENT, or here for further evaluation.

## 2024-12-12 ENCOUNTER — Other Ambulatory Visit (HOSPITAL_BASED_OUTPATIENT_CLINIC_OR_DEPARTMENT_OTHER): Payer: Self-pay

## 2024-12-12 MED ORDER — ROSUVASTATIN CALCIUM 10 MG PO TABS
10.0000 mg | ORAL_TABLET | Freq: Every day | ORAL | 1 refills | Status: AC
  Filled 2024-12-12: qty 90, 90d supply, fill #0

## 2024-12-12 MED ORDER — LANTUS SOLOSTAR 100 UNIT/ML ~~LOC~~ SOPN: 18.0000 [IU] | PEN_INJECTOR | Freq: Every day | SUBCUTANEOUS | 2 refills | Status: AC

## 2024-12-12 MED ORDER — KETOCONAZOLE 2 % EX CREA: 1.0000 | TOPICAL_CREAM | Freq: Every day | CUTANEOUS | 0 refills | Status: AC

## 2024-12-12 MED ORDER — SEMAGLUTIDE (2 MG/DOSE) 8 MG/3ML ~~LOC~~ SOPN
2.0000 mg | PEN_INJECTOR | SUBCUTANEOUS | 0 refills | Status: AC
  Filled 2024-12-12: qty 9, 84d supply, fill #0

## 2024-12-12 MED ORDER — GLUCOTROL XL 5 MG PO TB24: 5.0000 mg | ORAL_TABLET | Freq: Two times a day (BID) | ORAL | 0 refills | Status: AC

## 2024-12-12 MED ORDER — TRIAMCINOLONE ACETONIDE 0.5 % EX CREA: TOPICAL_CREAM | Freq: Two times a day (BID) | CUTANEOUS | 0 refills | Status: AC

## 2024-12-12 NOTE — Telephone Encounter (Signed)
 Patient was seen in UC. Patient advised I will be sending in refills requested. CPE scheduled for 02/23/2025.

## 2024-12-13 NOTE — Progress Notes (Unsigned)
 "    12/13/2024 Jamie Foster [id number] 05-07-1966  Referring provider: Colette Torrence GRADE, MD Primary GI doctor: Dr. San  ASSESSMENT AND PLAN:  GERD/LPR with hoarseness and epigastric discomfort Omeprazole  40 mg and Pepcid  20 mg at bedtime 06/01/2024 H. pylori Diatherix negative  Constipation 2019 KUB large amount of stool throughout the colon and down into the rectum 02/22/2024 KUB large amount of stool throughout the colon  CCS 06/2016 Dr. Teressa unremarkable Recall 06/2026  Fatty liver without LFT elevation Seen on abdominal ultrasound 01/2011    Latest Ref Rng & Units 09/07/2024   11:15 AM 08/24/2024   10:29 AM 02/22/2024    9:15 AM  Hepatic Function  Total Protein 6.0 - 8.5 g/dL 6.9  7.1  7.0   Albumin 3.8 - 4.9 g/dL 4.3  4.2  4.4   AST 0 - 40 IU/L 20  23  32   ALT 0 - 44 IU/L 25  27  35   Alk Phosphatase 47 - 123 IU/L 66  50  65   Total Bilirubin 0.0 - 1.2 mg/dL 0.5  0.5  0.4   Bilirubin, Direct 0.0 - 0.3 mg/dL  0.1     Platelets 694  FIB 4 *** Patients less than 1.45% are low risk, counsel and monitor.  Patients above 3.25% need referral for evaluation.  Patients between 1.45 and 3.25 can get fibroscan/evaluate.  - Check liver enzymes - If enzymes elevated, may consider additional serologic work-up to rule out concomitant liver disease - Depending on above evaluation and FIB-4, can consider ultrasound elastography/fibrosure - need LFTs and CBC monitored every 6 months, - evaluation with imaging every 2-3 years.  - Encouraged diet/exercise for modest 10% body weight loss as treatment for hepatic steatosis -Continue to work on risk factor modification including diet exercise and control of risk factors including blood sugars. - cessation of alcohol    Patient Care Team: Colette Torrence GRADE, MD as PCP - General (Family Medicine)  HISTORY OF PRESENT ILLNESS: 59 y.o. male with a past medical history listed below presents for evaluation of ***.   Last seen  in our office 05/18/2024 by [facility] for LPR/GERD, negative H. pylori Diatherix stool, set up for EGD with Dr. San 07/2024 but did not show for the endoscopy.  *** Discussed the use of AI scribe software for clinical note transcription with the patient, who gave verbal consent to proceed.  History of Present Illness            He  reports that he has never smoked. He has never used smokeless tobacco. He reports that he does not drink alcohol and does not use drugs.  RELEVANT GI HISTORY, IMAGING AND LABS: Results          CBC    Component Value Date/Time   WBC 5.9 09/07/2024 1115   WBC 5.9 08/24/2024 1029   RBC 4.53 09/07/2024 1115   RBC 4.34 08/24/2024 1029   HGB 13.6 09/07/2024 1115   HCT 42.8 09/07/2024 1115   PLT 305 09/07/2024 1115   MCV 95 09/07/2024 1115   MCH 30.0 09/07/2024 1115   MCH 29.8 05/03/2018 1643   MCH 29.8 08/30/2014 1552   MCHC 31.8 09/07/2024 1115   MCHC 32.7 08/24/2024 1029   RDW 12.5 09/07/2024 1115   LYMPHSABS 2.0 09/07/2024 1115   MONOABS 0.6 08/24/2024 1029   EOSABS 0.2 09/07/2024 1115   BASOSABS 0.1 09/07/2024 1115   Recent Labs    02/22/24 0915 06/30/24  1029 08/24/24 1029 09/07/24 1115  HGB 14.0 14.1 13.2 13.6    CMP     Component Value Date/Time   NA 138 09/07/2024 1115   K 4.3 09/07/2024 1115   CL 101 09/07/2024 1115   CO2 24 09/07/2024 1115   GLUCOSE 170 (H) 09/07/2024 1115   GLUCOSE 224 (H) 08/24/2024 1029   BUN 17 09/07/2024 1115   CREATININE 1.23 09/07/2024 1115   CREATININE 1.16 12/21/2015 1028   CALCIUM  9.5 09/07/2024 1115   PROT 6.9 09/07/2024 1115   ALBUMIN 4.3 09/07/2024 1115   AST 20 09/07/2024 1115   ALT 25 09/07/2024 1115   ALKPHOS 66 09/07/2024 1115   BILITOT 0.5 09/07/2024 1115   GFRNONAA 76 05/03/2018 1641   GFRNONAA 73 12/21/2015 1028   GFRAA 88 05/03/2018 1641   GFRAA 84 12/21/2015 1028      Latest Ref Rng & Units 09/07/2024   11:15 AM 08/24/2024   10:29 AM 02/22/2024    9:15 AM  Hepatic  Function  Total Protein 6.0 - 8.5 g/dL 6.9  7.1  7.0   Albumin 3.8 - 4.9 g/dL 4.3  4.2  4.4   AST 0 - 40 IU/L 20  23  32   ALT 0 - 44 IU/L 25  27  35   Alk Phosphatase 47 - 123 IU/L 66  50  65   Total Bilirubin 0.0 - 1.2 mg/dL 0.5  0.5  0.4   Bilirubin, Direct 0.0 - 0.3 mg/dL  0.1        Current Medications:   Current Outpatient Medications (Endocrine & Metabolic):    glipiZIDE  (GLUCOTROL ) 10 MG tablet, TAKE 1 TABLET BY MOUTH EVERY MORNING   GLUCOTROL  XL 5 MG 24 hr tablet, Take 1 tablet (5 mg total) by mouth 2 (two) times daily.   insulin  glargine (LANTUS  SOLOSTAR) 100 UNIT/ML Solostar Pen, Inject 18 Units into the skin at bedtime.   methylPREDNISolone  (MEDROL  DOSEPAK) 4 MG TBPK tablet, Take with signs of chronic sinusitis and take as directed   Semaglutide , 2 MG/DOSE, 8 MG/3ML SOPN, Inject 2 mg as directed once a week.  Current Outpatient Medications (Cardiovascular):    rosuvastatin  (CRESTOR ) 10 MG tablet, Take 1 tablet (10 mg total) by mouth daily.   valsartan  (DIOVAN ) 160 MG tablet, Take 1 tablet (160 mg total) by mouth daily.  Current Outpatient Medications (Respiratory):    fluticasone  (FLONASE ) 50 MCG/ACT nasal spray, Place 2 sprays into both nostrils daily.   levocetirizine (XYZAL  ALLERGY 24HR) 5 MG tablet, Take 1 tablet (5 mg total) by mouth every evening.  Current Outpatient Medications (Analgesics):    celecoxib  (CELEBREX ) 100 MG capsule, Take 1 capsule (100 mg total) by mouth 2 (two) times daily.  Current Outpatient Medications (Other):    amoxicillin -clavulanate (AUGMENTIN ) 875-125 MG tablet, Take 1 tablet by mouth 2 (two) times daily for 10 days.   amphetamine -dextroamphetamine  (ADDERALL) 30 MG tablet, Take 1 tablet by mouth 2 (two) times daily. TAKE ONE TABLET IN MORNING AND ONE TABLET AT NOON.   Continuous Glucose Sensor (FREESTYLE LIBRE 3 PLUS SENSOR) MISC, Change sensor every 14 (fourteen) days. (Patient not taking: Reported on 08/24/2024)   Continuous Glucose  Sensor (FREESTYLE LIBRE 3 PLUS SENSOR) MISC, Apply 1 sensor as directed to check blood sugar and change every 15 (fifteen) days. (Patient not taking: Reported on 08/24/2024)   famotidine  (PEPCID ) 20 MG tablet, One after supper (Patient not taking: Reported on 08/24/2024)   Insulin  Pen Needle 32G X 4 MM MISC,  Use as directed 1 time a day.   ketoconazole  (NIZORAL ) 2 % cream, Apply liberally to affected area once a day.   pantoprazole  (PROTONIX ) 40 MG tablet, Take 1 tablet (40 mg total) by mouth 2 (two) times daily before a meal.   triamcinolone  cream (KENALOG ) 0.5 %, Apply topically 2 (two) times daily.  Medical History:  Past Medical History:  Diagnosis Date   Allergy    Diabetes mellitus without complication (HCC)    GERD (gastroesophageal reflux disease)    H pylori ulcer    Hyperlipidemia    Hypertension    Kidney stone    Sleep apnea    Allergies: Allergies[1]   Surgical History:  He  has a past surgical history that includes Nasal sinus surgery. Family History:  His family history includes Hypertension in his mother; Prostate cancer in his father.  REVIEW OF SYSTEMS  : All other systems reviewed and negative except where noted in the History of Present Illness.  PHYSICAL EXAM: There were no vitals taken for this visit. Physical Exam          Alan JONELLE Coombs, PA-C 7:56 AM      [1]  Allergies Allergen Reactions   Lisinopril  Cough   Metformin  And Related Other (See Comments)    Abdominal pain   "

## 2024-12-14 ENCOUNTER — Ambulatory Visit: Admitting: Physician Assistant

## 2024-12-19 ENCOUNTER — Other Ambulatory Visit (HOSPITAL_BASED_OUTPATIENT_CLINIC_OR_DEPARTMENT_OTHER): Payer: Self-pay

## 2025-02-23 ENCOUNTER — Encounter: Admitting: Family Medicine

## 2025-03-13 ENCOUNTER — Ambulatory Visit: Admitting: "Endocrinology
